# Patient Record
Sex: Female | Born: 1940
Health system: Southern US, Community
[De-identification: ages and names within clinical notes are randomized; demographics above are authoritative.]

## PROBLEM LIST (undated history)

## (undated) DIAGNOSIS — M8580 Other specified disorders of bone density and structure, unspecified site: Secondary | ICD-10-CM

## (undated) DIAGNOSIS — K2100 Gastro-esophageal reflux disease with esophagitis, without bleeding: Secondary | ICD-10-CM

## (undated) DIAGNOSIS — E785 Hyperlipidemia, unspecified: Secondary | ICD-10-CM

## (undated) DIAGNOSIS — Z8719 Personal history of other diseases of the digestive system: Secondary | ICD-10-CM

## (undated) DIAGNOSIS — M810 Age-related osteoporosis without current pathological fracture: Secondary | ICD-10-CM

## (undated) DIAGNOSIS — H049 Disorder of lacrimal system, unspecified: Secondary | ICD-10-CM

## (undated) DIAGNOSIS — N289 Disorder of kidney and ureter, unspecified: Secondary | ICD-10-CM

## (undated) DIAGNOSIS — I10 Essential (primary) hypertension: Secondary | ICD-10-CM

## (undated) DIAGNOSIS — H109 Unspecified conjunctivitis: Secondary | ICD-10-CM

## (undated) DIAGNOSIS — E039 Hypothyroidism, unspecified: Secondary | ICD-10-CM

## (undated) DIAGNOSIS — K219 Gastro-esophageal reflux disease without esophagitis: Secondary | ICD-10-CM

## (undated) DIAGNOSIS — K769 Liver disease, unspecified: Secondary | ICD-10-CM

## (undated) DIAGNOSIS — M1990 Unspecified osteoarthritis, unspecified site: Secondary | ICD-10-CM

## (undated) DIAGNOSIS — E559 Vitamin D deficiency, unspecified: Secondary | ICD-10-CM

## (undated) DIAGNOSIS — M25539 Pain in unspecified wrist: Secondary | ICD-10-CM

## (undated) DIAGNOSIS — R1319 Other dysphagia: Secondary | ICD-10-CM

## (undated) DIAGNOSIS — M949 Disorder of cartilage, unspecified: Secondary | ICD-10-CM

## (undated) DIAGNOSIS — IMO0001 Reserved for inherently not codable concepts without codable children: Secondary | ICD-10-CM

## (undated) DIAGNOSIS — M899 Disorder of bone, unspecified: Secondary | ICD-10-CM

## (undated) DIAGNOSIS — L80 Vitiligo: Secondary | ICD-10-CM

## (undated) DIAGNOSIS — N200 Calculus of kidney: Secondary | ICD-10-CM

## (undated) HISTORY — DX: Age-related osteoporosis without current pathological fracture: M81.0

## (undated) HISTORY — DX: Unspecified osteoarthritis, unspecified site: M19.90

## (undated) HISTORY — DX: Disorder of lacrimal system, unspecified: H04.9

## (undated) HISTORY — DX: Disorder of kidney and ureter, unspecified: N28.9

## (undated) HISTORY — DX: Calculus of kidney: N20.0

## (undated) HISTORY — DX: Hypothyroidism, unspecified: E03.9

## (undated) HISTORY — PX: LEG SURGERY: SHX1003

## (undated) HISTORY — DX: Essential (primary) hypertension: I10

## (undated) HISTORY — DX: Unspecified conjunctivitis: H10.9

## (undated) HISTORY — PX: COLONOSCOPY: SHX174

## (undated) HISTORY — DX: Pain in unspecified wrist: M25.539

## (undated) HISTORY — DX: Hyperlipidemia, unspecified: E78.5

## (undated) HISTORY — DX: Vitiligo: L80

## (undated) HISTORY — DX: Liver disease, unspecified: K76.9

## (undated) HISTORY — DX: Reserved for inherently not codable concepts without codable children: IMO0001

## (undated) HISTORY — DX: Vitamin D deficiency, unspecified: E55.9

## (undated) HISTORY — DX: Other specified disorders of bone density and structure, unspecified site: M85.80

## (undated) HISTORY — DX: Gastro-esophageal reflux disease without esophagitis: K21.9

## (undated) HISTORY — DX: Gastro-esophageal reflux disease with esophagitis, without bleeding: K21.00

## (undated) HISTORY — DX: Disorder of bone, unspecified: M89.9

## (undated) HISTORY — DX: Other dysphagia: R13.19

## (undated) HISTORY — DX: Gastro-esophageal reflux disease with esophagitis: K21.0

## (undated) HISTORY — DX: Disorder of cartilage, unspecified: M94.9

## (undated) HISTORY — DX: Personal history of other diseases of the digestive system: Z87.19

---

## 1971-04-05 HISTORY — PX: TUBAL LIGATION: SHX77

## 1997-11-13 ENCOUNTER — Ambulatory Visit (HOSPITAL_COMMUNITY): Admission: RE | Admit: 1997-11-13 | Discharge: 1997-11-13 | Payer: Self-pay | Admitting: Family Medicine

## 1999-06-05 ENCOUNTER — Encounter: Payer: Self-pay | Admitting: Emergency Medicine

## 1999-06-05 ENCOUNTER — Emergency Department (HOSPITAL_COMMUNITY): Admission: EM | Admit: 1999-06-05 | Discharge: 1999-06-05 | Payer: Self-pay | Admitting: Emergency Medicine

## 2000-08-29 ENCOUNTER — Other Ambulatory Visit: Admission: RE | Admit: 2000-08-29 | Discharge: 2000-08-29 | Payer: Self-pay | Admitting: Family Medicine

## 2000-09-15 ENCOUNTER — Encounter: Payer: Self-pay | Admitting: Family Medicine

## 2000-09-15 ENCOUNTER — Encounter: Admission: RE | Admit: 2000-09-15 | Discharge: 2000-09-15 | Payer: Self-pay | Admitting: Family Medicine

## 2000-10-23 ENCOUNTER — Ambulatory Visit: Admission: RE | Admit: 2000-10-23 | Discharge: 2000-10-23 | Payer: Self-pay | Admitting: *Deleted

## 2001-08-07 ENCOUNTER — Encounter: Admission: RE | Admit: 2001-08-07 | Discharge: 2001-11-05 | Payer: Self-pay | Admitting: Internal Medicine

## 2001-09-03 ENCOUNTER — Other Ambulatory Visit: Admission: RE | Admit: 2001-09-03 | Discharge: 2001-09-03 | Payer: Self-pay | Admitting: Internal Medicine

## 2004-01-05 ENCOUNTER — Ambulatory Visit: Payer: Self-pay | Admitting: Family Medicine

## 2004-01-09 ENCOUNTER — Ambulatory Visit: Payer: Self-pay | Admitting: *Deleted

## 2004-02-16 ENCOUNTER — Ambulatory Visit: Payer: Self-pay | Admitting: Family Medicine

## 2004-02-17 ENCOUNTER — Ambulatory Visit: Payer: Self-pay | Admitting: Family Medicine

## 2004-04-19 ENCOUNTER — Ambulatory Visit: Payer: Self-pay | Admitting: Family Medicine

## 2004-04-26 ENCOUNTER — Ambulatory Visit: Payer: Self-pay | Admitting: *Deleted

## 2004-06-21 ENCOUNTER — Ambulatory Visit: Payer: Self-pay | Admitting: Family Medicine

## 2004-08-23 ENCOUNTER — Ambulatory Visit: Payer: Self-pay | Admitting: Family Medicine

## 2004-08-30 ENCOUNTER — Emergency Department (HOSPITAL_COMMUNITY): Admission: EM | Admit: 2004-08-30 | Discharge: 2004-08-30 | Payer: Self-pay | Admitting: Emergency Medicine

## 2004-09-20 ENCOUNTER — Ambulatory Visit: Payer: Self-pay | Admitting: Family Medicine

## 2004-09-21 ENCOUNTER — Ambulatory Visit (HOSPITAL_COMMUNITY): Admission: RE | Admit: 2004-09-21 | Discharge: 2004-09-21 | Payer: Self-pay | Admitting: Internal Medicine

## 2004-09-28 ENCOUNTER — Ambulatory Visit: Payer: Self-pay | Admitting: Family Medicine

## 2004-10-11 ENCOUNTER — Ambulatory Visit: Payer: Self-pay | Admitting: Internal Medicine

## 2004-10-18 ENCOUNTER — Ambulatory Visit (HOSPITAL_COMMUNITY): Admission: RE | Admit: 2004-10-18 | Discharge: 2004-10-18 | Payer: Self-pay | Admitting: Internal Medicine

## 2004-10-18 ENCOUNTER — Encounter: Admission: RE | Admit: 2004-10-18 | Discharge: 2004-10-18 | Payer: Self-pay | Admitting: Family Medicine

## 2004-11-15 ENCOUNTER — Ambulatory Visit: Payer: Self-pay | Admitting: Family Medicine

## 2005-04-08 ENCOUNTER — Ambulatory Visit: Payer: Self-pay | Admitting: Family Medicine

## 2005-04-28 ENCOUNTER — Ambulatory Visit: Payer: Self-pay | Admitting: Family Medicine

## 2005-06-29 ENCOUNTER — Ambulatory Visit: Payer: Self-pay | Admitting: Family Medicine

## 2005-09-26 ENCOUNTER — Ambulatory Visit: Payer: Self-pay | Admitting: Family Medicine

## 2005-10-27 ENCOUNTER — Ambulatory Visit: Payer: Self-pay | Admitting: Family Medicine

## 2005-11-15 ENCOUNTER — Ambulatory Visit: Payer: Self-pay | Admitting: Family Medicine

## 2005-12-08 ENCOUNTER — Ambulatory Visit (HOSPITAL_COMMUNITY): Admission: RE | Admit: 2005-12-08 | Discharge: 2005-12-08 | Payer: Self-pay | Admitting: Internal Medicine

## 2005-12-22 ENCOUNTER — Encounter: Admission: RE | Admit: 2005-12-22 | Discharge: 2005-12-22 | Payer: Self-pay | Admitting: Family Medicine

## 2006-01-24 ENCOUNTER — Ambulatory Visit: Payer: Self-pay | Admitting: Family Medicine

## 2006-03-29 ENCOUNTER — Ambulatory Visit: Payer: Self-pay | Admitting: Family Medicine

## 2006-07-30 ENCOUNTER — Emergency Department (HOSPITAL_COMMUNITY): Admission: EM | Admit: 2006-07-30 | Discharge: 2006-07-30 | Payer: Self-pay | Admitting: Family Medicine

## 2006-07-31 ENCOUNTER — Ambulatory Visit: Payer: Self-pay | Admitting: Family Medicine

## 2006-08-10 ENCOUNTER — Ambulatory Visit: Payer: Self-pay | Admitting: Family Medicine

## 2006-09-07 ENCOUNTER — Ambulatory Visit: Payer: Self-pay | Admitting: Internal Medicine

## 2006-09-29 ENCOUNTER — Ambulatory Visit: Payer: Self-pay | Admitting: Internal Medicine

## 2006-10-02 ENCOUNTER — Ambulatory Visit (HOSPITAL_COMMUNITY): Admission: RE | Admit: 2006-10-02 | Discharge: 2006-10-02 | Payer: Self-pay | Admitting: Family Medicine

## 2006-10-09 ENCOUNTER — Ambulatory Visit: Payer: Self-pay | Admitting: Family Medicine

## 2006-10-13 ENCOUNTER — Ambulatory Visit (HOSPITAL_COMMUNITY): Admission: RE | Admit: 2006-10-13 | Discharge: 2006-10-13 | Payer: Self-pay | Admitting: Internal Medicine

## 2006-12-07 ENCOUNTER — Ambulatory Visit: Payer: Self-pay | Admitting: Gastroenterology

## 2006-12-11 ENCOUNTER — Ambulatory Visit (HOSPITAL_COMMUNITY): Admission: RE | Admit: 2006-12-11 | Discharge: 2006-12-11 | Payer: Self-pay | Admitting: Gastroenterology

## 2006-12-20 ENCOUNTER — Encounter (INDEPENDENT_AMBULATORY_CARE_PROVIDER_SITE_OTHER): Payer: Self-pay | Admitting: *Deleted

## 2006-12-25 ENCOUNTER — Ambulatory Visit: Payer: Self-pay | Admitting: Gastroenterology

## 2006-12-25 LAB — CONVERTED CEMR LAB
BUN: 10 mg/dL (ref 6–23)
Creatinine, Ser: 0.7 mg/dL (ref 0.4–1.2)

## 2006-12-29 ENCOUNTER — Encounter: Admission: RE | Admit: 2006-12-29 | Discharge: 2006-12-29 | Payer: Self-pay | Admitting: Gastroenterology

## 2006-12-29 DIAGNOSIS — N809 Endometriosis, unspecified: Secondary | ICD-10-CM | POA: Insufficient documentation

## 2006-12-29 DIAGNOSIS — D259 Leiomyoma of uterus, unspecified: Secondary | ICD-10-CM | POA: Insufficient documentation

## 2007-02-07 ENCOUNTER — Ambulatory Visit (HOSPITAL_COMMUNITY): Admission: RE | Admit: 2007-02-07 | Discharge: 2007-02-07 | Payer: Self-pay | Admitting: *Deleted

## 2007-06-14 ENCOUNTER — Encounter: Admission: RE | Admit: 2007-06-14 | Discharge: 2007-06-14 | Payer: Self-pay | Admitting: *Deleted

## 2007-06-28 DIAGNOSIS — E785 Hyperlipidemia, unspecified: Secondary | ICD-10-CM | POA: Insufficient documentation

## 2007-06-28 DIAGNOSIS — N200 Calculus of kidney: Secondary | ICD-10-CM | POA: Insufficient documentation

## 2007-06-28 DIAGNOSIS — M810 Age-related osteoporosis without current pathological fracture: Secondary | ICD-10-CM | POA: Insufficient documentation

## 2007-06-28 DIAGNOSIS — I10 Essential (primary) hypertension: Secondary | ICD-10-CM | POA: Insufficient documentation

## 2007-06-28 DIAGNOSIS — E039 Hypothyroidism, unspecified: Secondary | ICD-10-CM | POA: Insufficient documentation

## 2008-03-14 ENCOUNTER — Ambulatory Visit (HOSPITAL_COMMUNITY): Admission: RE | Admit: 2008-03-14 | Discharge: 2008-03-14 | Payer: Self-pay | Admitting: *Deleted

## 2008-04-08 ENCOUNTER — Encounter: Admission: RE | Admit: 2008-04-08 | Discharge: 2008-04-08 | Payer: Self-pay | Admitting: *Deleted

## 2008-12-26 ENCOUNTER — Ambulatory Visit (HOSPITAL_COMMUNITY): Admission: RE | Admit: 2008-12-26 | Discharge: 2008-12-26 | Payer: Self-pay | Admitting: Internal Medicine

## 2009-01-02 IMAGING — CT CT ENTERO PELVIS W/CM
1 of 3 series · 14 of 32 positions shown, 18 images · non-contrast
Comparison: 10/02/06.

CLINICAL DATA: Lower abdominal and pelvic pain.  Constipation.  Possible distal small bowel mass seen on recent CT.  
CT ABDOMEN ENTEROGRAPHY:
TECHNIQUE: Multidetector CT imaging of the abdomen and pelvis was performed following administration of 2029 cc of VoLumen low attenuation enteric contrast.  Images were acquired after bolus injection of nonionic intravenous contrast with coronal and sagittal reformations reviewed for assessment of the small bowel mucosa.

[Series 5: abd_pel 3.0 b40f st · axial · 0.58mm/px · z∈[-384,+21]mm · 14 of 149 slices shown, 18 images]
[im 7/149  soft-tissue]
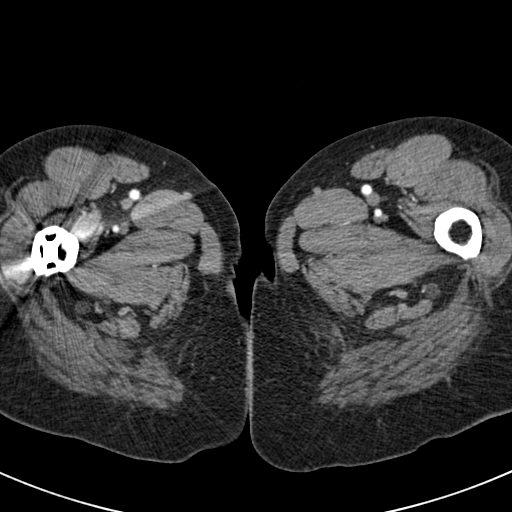
[im 7/149  bone]
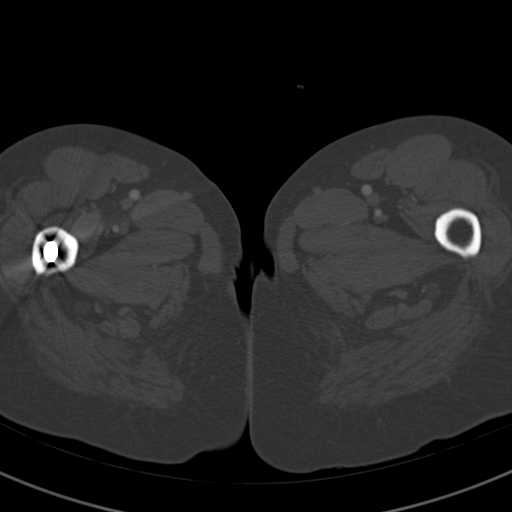
[im 21/149  soft-tissue]
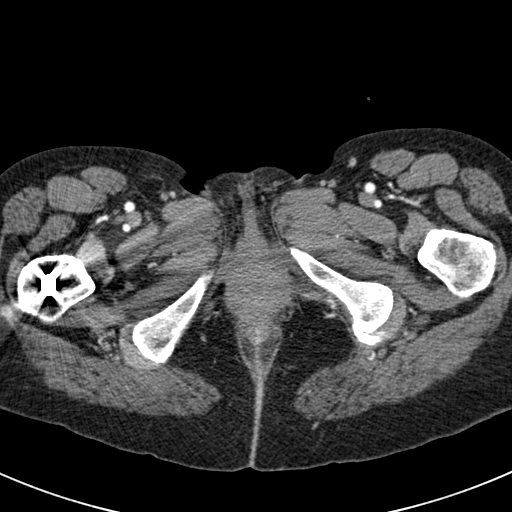
[im 34/149  soft-tissue]
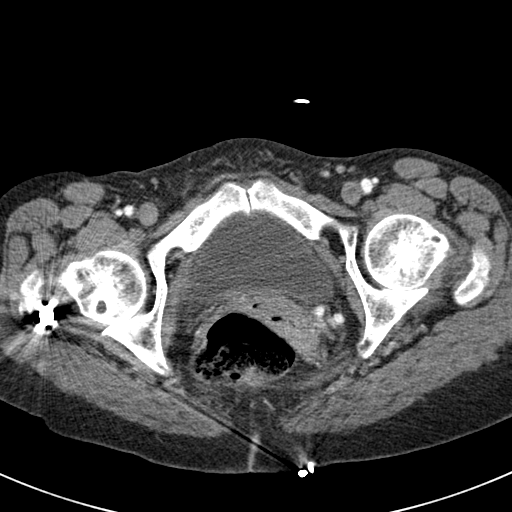
[im 48/149  soft-tissue]
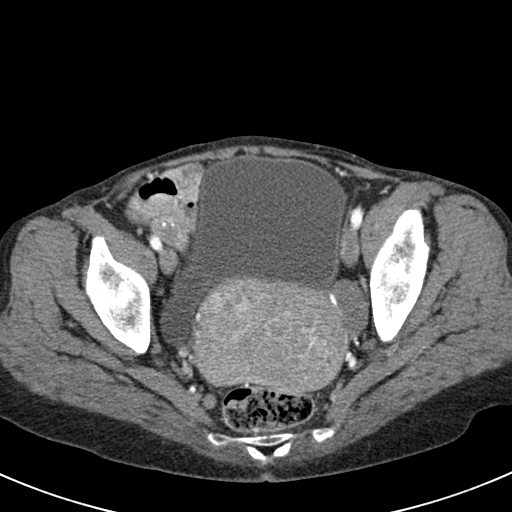
[im 54/149  soft-tissue]
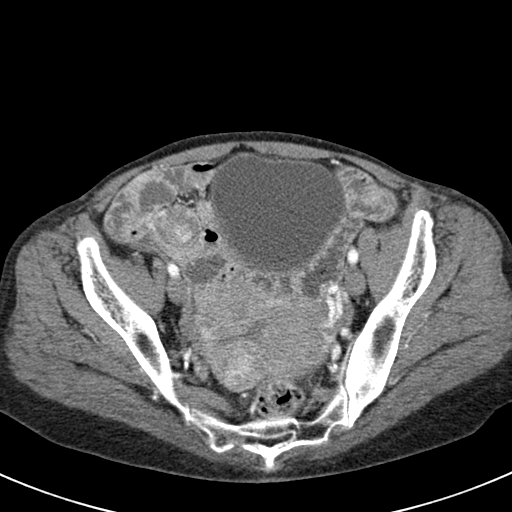
[im 68/149  soft-tissue]
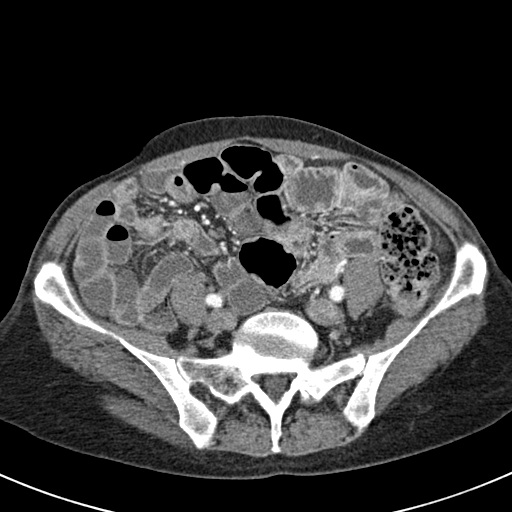
[im 81/149  soft-tissue]
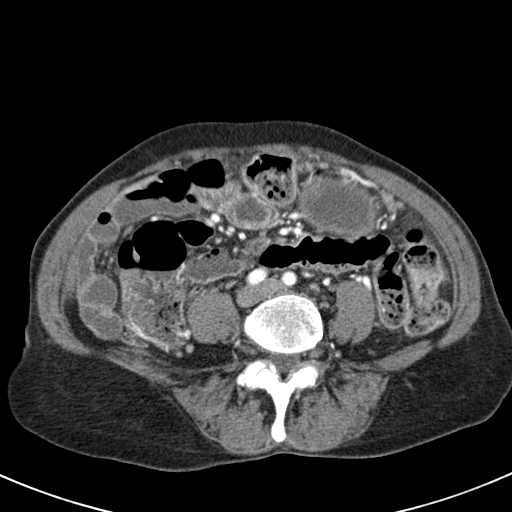
[im 95/149  soft-tissue]
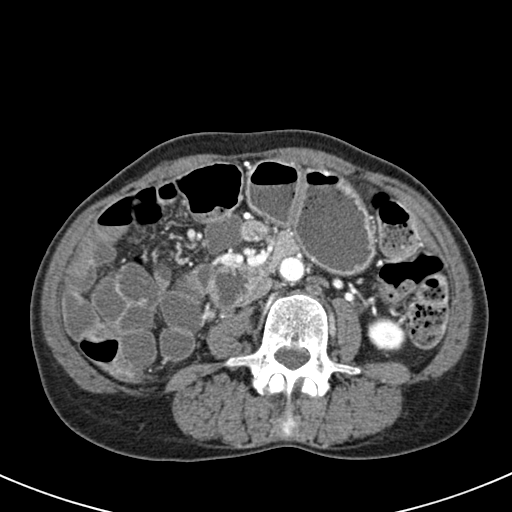
[im 101/149  soft-tissue]
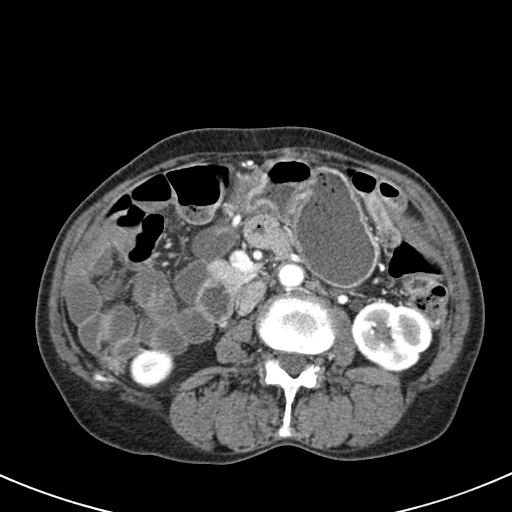
[im 101/149  bone]
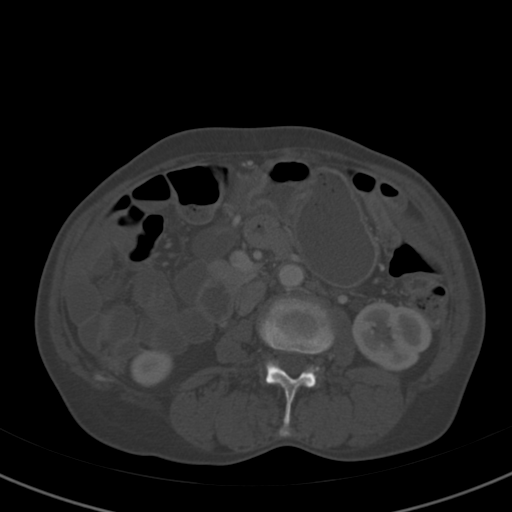
[im 115/149  soft-tissue]
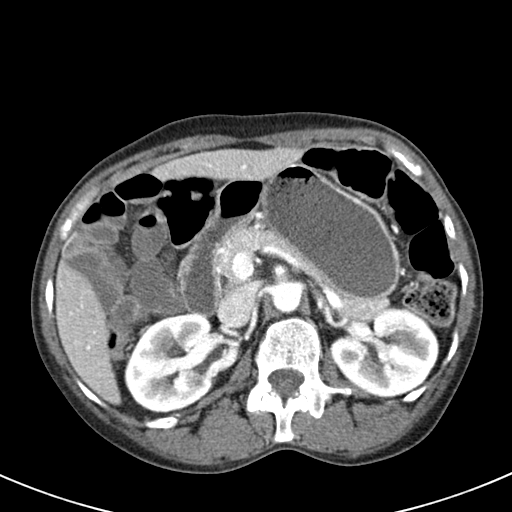
[im 122/149  lung]
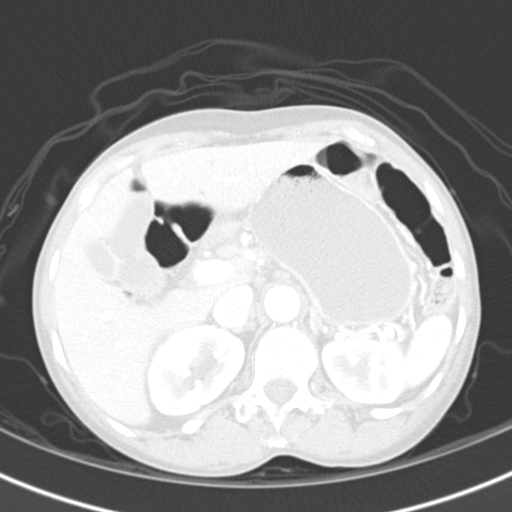
[im 128/149  soft-tissue]
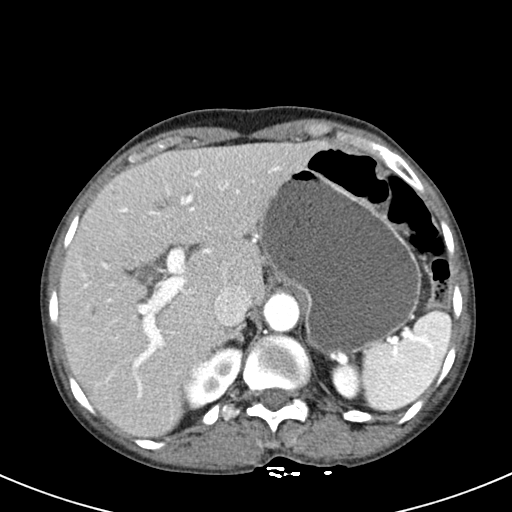
[im 128/149  lung]
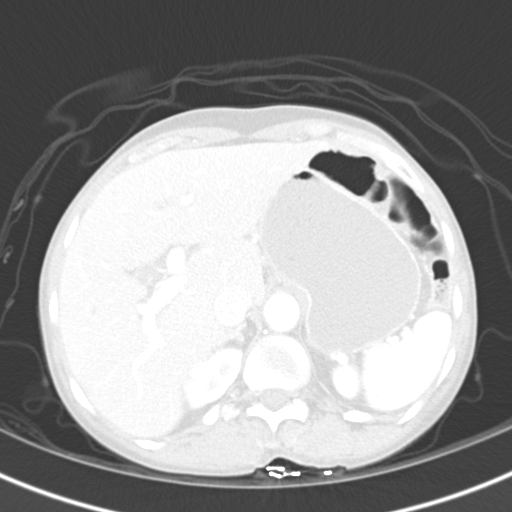
[im 135/149  lung]
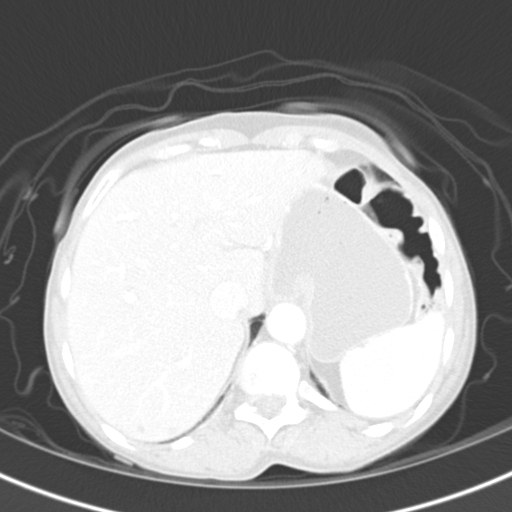
[im 142/149  soft-tissue]
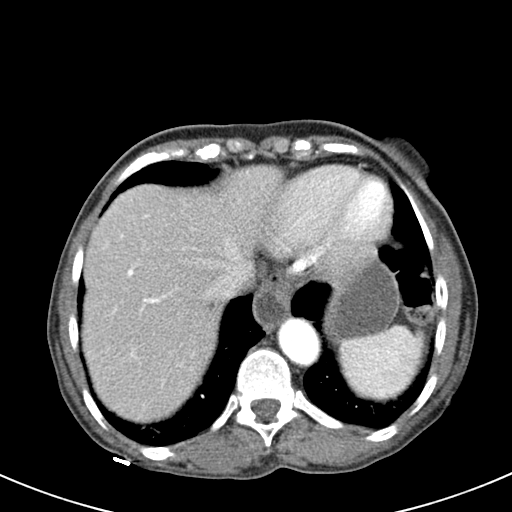
[im 142/149  lung]
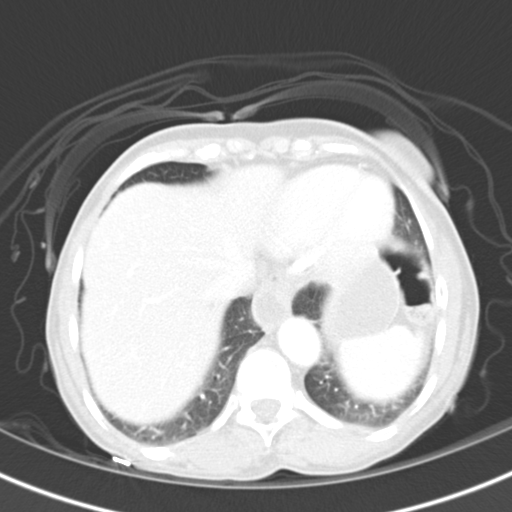

[14 of 32 positions shown; findings below may reference images not displayed]

FINDINGS: Multiple tiny hepatic cysts are again noted which are stable.  No liver masses are identified.  The spleen, pancreas, adrenal glands, and kidneys are unremarkable in appearance. A tiny less than 1 cm left renal cyst is incidentally noted.  There is no evidence of hydronephrosis. 
There is no evidence of soft tissue masses or lymphadenopathy in the abdomen.  The small bowel is well distended and no masses or areas of wall thickening are seen involving small bowel. There is no evidence of small bowel dilatation or wall thickening within the abdomen and no inflammatory process or ascites is seen.
IMPRESSION: 1.  Normal appearance of abdominal small bowel.  No mass or adenopathy identified.
2.  Stable tiny hepatic and left renal cysts.
CT PELVIS ENTEROGRAPHY:
FINDINGS: The small bowel is well distended and there is a persistent enhancing soft tissue mass seen in the right pelvis which cannot be separated from small bowel and is located along the anterior aspect of the right internal iliac vessels.  This measures 3.1 x 2.5 cm and contains two focal calcifications inferiorly.  This is stable compared with the prior study.
Multiple uterine fibroids are again noted and are without significant change.  There is no evidence of an inflammatory process or ascites.  There is no evidence of small bowel obstruction.
IMPRESSION: 1.  Multiple uterine fibroids again noted.  
2.  Persistent 3 cm soft tissue mass in the right pelvis along the anterior aspect of the internal iliac vessels, in the region of the terminal ileum and right adnexa.  Differential considerations include a GI stromal tumor, broad ligament fibroid, or right ovarian mass.  
3.  No evidence of adenopathy or ascites.

## 2009-04-09 ENCOUNTER — Ambulatory Visit (HOSPITAL_COMMUNITY): Admission: RE | Admit: 2009-04-09 | Discharge: 2009-04-09 | Payer: Self-pay | Admitting: Internal Medicine

## 2009-11-12 ENCOUNTER — Encounter (INDEPENDENT_AMBULATORY_CARE_PROVIDER_SITE_OTHER): Payer: Self-pay | Admitting: *Deleted

## 2009-12-02 ENCOUNTER — Encounter (INDEPENDENT_AMBULATORY_CARE_PROVIDER_SITE_OTHER): Payer: Self-pay | Admitting: *Deleted

## 2010-01-04 ENCOUNTER — Encounter (INDEPENDENT_AMBULATORY_CARE_PROVIDER_SITE_OTHER): Payer: Self-pay | Admitting: *Deleted

## 2010-01-04 ENCOUNTER — Telehealth (INDEPENDENT_AMBULATORY_CARE_PROVIDER_SITE_OTHER): Payer: Self-pay | Admitting: *Deleted

## 2010-01-06 ENCOUNTER — Ambulatory Visit: Payer: Self-pay | Admitting: Gastroenterology

## 2010-01-20 ENCOUNTER — Ambulatory Visit: Payer: Self-pay | Admitting: Gastroenterology

## 2010-04-25 ENCOUNTER — Encounter: Payer: Self-pay | Admitting: Internal Medicine

## 2010-05-06 NOTE — Miscellaneous (Signed)
Summary: previsit prep/rm  Clinical Lists Changes  Medications: Added new medication of MOVIPREP 100 GM  SOLR (PEG-KCL-NACL-NASULF-NA ASC-C) As per prep instructions. - Signed Rx of MOVIPREP 100 GM  SOLR (PEG-KCL-NACL-NASULF-NA ASC-C) As per prep instructions.;  #1 x 0;  Signed;  Entered by: Sherren Kerns RN;  Authorized by: Meryl Dare MD [facility];  Method [facility]: [facility] [id number]*, [address] [place], [place], [place]  [id number], Ph: [phone] or [phone], Fax: [phone] Observations: Added new observation of ALLERGY REV: Done (01/06/2010 10:49) Added new observation of NKA: T (01/06/2010 10:49)    Prescriptions: MOVIPREP 100 GM  SOLR (PEG-KCL-NACL-NASULF-NA ASC-C) As per prep instructions.  #1 x 0   Entered by:   Sherren Kerns RN   Authorized by:   Meryl Dare MD [facility]   Signed by:   Sherren Kerns RN on 01/06/2010   Method used:   [facility]ron[facility] to        [facility] [address] [phone]* (retail)       [address]       [place], [place]  [id number]       Ph: [phone] or [phone]       Fax: [phone]   RxID:   [id number]

## 2010-05-06 NOTE — Letter (Signed)
Summary: [facility] Instructions  [facility]  [address] [place], [place] [address]   Phone: [phone]  Fax: [phone]       Tina Roberts    Apr 29, 1940    MRN: [id number]        Procedure Day Dorna Bloom: Wednesday 01/20/2010     Arrival Time:  10:00 am      Procedure Time: 11:00 am     Location of Procedure:                    _x _  [facility] (4th Floor)   PREPARATION FOR COLONOSCOPY WITH MOVIPREP   Starting 5 days prior to your procedure Friday 10/14 do not eat nuts, seeds, popcorn, corn, beans, peas,  salads, or any raw vegetables.  Do not take any fiber supplements (e.g. Metamucil, Citrucel, and Benefiber).  THE DAY BEFORE YOUR PROCEDURE         DATE: Tuesday 10/18  1.  Drink clear liquids the entire day-NO SOLID FOOD  2.  Do not drink anything colored red or purple.  Avoid juices with pulp.  No orange juice.  3.  Drink at least 64 oz. (8 glasses) of fluid/clear liquids during the day to prevent dehydration and help the prep work efficiently.  CLEAR LIQUIDS INCLUDE: Water Jello Ice Popsicles Tea (sugar ok, no milk/cream) Powdered fruit flavored drinks Coffee (sugar ok, no milk/cream) Gatorade Juice: apple, white grape, white cranberry  Lemonade Clear bullion, consomm, broth Carbonated beverages (any kind) Strained chicken noodle soup Hard Candy                             4.  In the morning, mix first dose of MoviPrep solution:    Empty 1 Pouch A and 1 Pouch B into the disposable container    Add lukewarm drinking water to the top line of the container. Mix to dissolve    Refrigerate (mixed solution should be used within 24 hrs)  YOU WILL NEED TO BUY 2 BOTTLES OF MAGNESIUM CITRATE AND DRINK  AROUND 2:00 or 3:00 IN THE AFTERNOON 5.  Begin drinking the prep at 5:00 p.m. The MoviPrep container is divided by 4 marks.   Every 15 minutes drink the solution down to the next mark (approximately 8 oz) until the full liter is  complete.   6.  Follow completed prep with 16 oz of clear liquid of your choice (Nothing red or purple).  Continue to drink clear liquids until bedtime.  7.  Before going to bed, mix second dose of MoviPrep solution:    Empty 1 Pouch A and 1 Pouch B into the disposable container    Add lukewarm drinking water to the top line of the container. Mix to dissolve    Refrigerate  THE DAY OF YOUR PROCEDURE      DATE:  01-20-10  DAY:  Wednesday  Beginning at  6:00 a.m. (5 hours before procedure):         1. Every 15 minutes, drink the solution down to the next mark (approx 8 oz) until the full liter is complete.  2. Follow completed prep with 16 oz. of clear liquid of your choice.    3. You may drink clear liquids until  9:00 a.m. (2 HOURS BEFORE PROCEDURE).   MEDICATION INSTRUCTIONS  Unless otherwise instructed, you should take regular prescription medications with a small sip of water   as early  as possible the morning of your procedure.   Additional medication instructions: n/a        OTHER INSTRUCTIONS  You will need a responsible adult at least 69 years of age to accompany you and drive you home.   This person must remain in the waiting room during your procedure.  Wear loose fitting clothing that is easily removed.  Leave jewelry and other valuables at home.  However, you may wish to bring a book to read or  an iPod/MP3 player to listen to music as you wait for your procedure to start.  Remove all body piercing jewelry and leave at home.  Total time from sign-in until discharge is approximately 2-3 hours.  You should go home directly after your procedure and rest.  You can resume normal activities the  day after your procedure.  The day of your procedure you should not:   Drive   Make legal decisions   Operate machinery   Drink alcohol   Return to work  You will receive specific instructions about eating, activities and medications before you  leave.    The above instructions have been reviewed and explained to me by   Sherren Kerns RN  January 06, 2010 11:16 AM     I fully understand and can verbalize these instructions _____________________________ Date _________

## 2010-05-06 NOTE — Progress Notes (Signed)
Summary: need for more extensive prep  Please have the patient take two bottles of Mag Citrate in the afternoon before begininng MoviPrep. MS  Dr. Russella Dar - Stefan Church had colonoscopy 12/25/2006.  You noted that pt would need more extensive prep for future colonoscopy.  She is scheduled for colonoscopy 01/20/2010.  What would you like for her to do for prep?  Thanks, Ezra Sites     Follow-up for Phone Call       Follow-up by: Meryl Dare MD Clementeen Graham,  January 04, 2010 3:18 PM

## 2010-05-06 NOTE — Letter (Signed)
Summary: Colonoscopy Letter  [facility]  [address] [place], [place] [address]   Phone: [phone]  Fax: [phone]      November 12, 2009 MRN: [id number]   Tina Roberts [address] [place], [place]  [address]   Dear Tina Roberts,   According to your medical record, it is time for you to schedule a Colonoscopy. The American Cancer Society recommends this procedure as a method to detect early colon cancer. Patients with a family history of colon cancer, or a personal history of colon polyps or inflammatory bowel disease are at increased risk.  This letter has beeen generated based on the recommendations made at the time of your procedure. If you feel that in your particular situation this may no longer apply, please contact our office.  Please call our office at [phone] to schedule this appointment or to update your records at your earliest convenience.  Thank you for cooperating with [place] to provide you with the very best care possible.   Sincerely,  Judie Petit T. Russella Dar, M.D.  [facility] Gastroenterology Division [phone]

## 2010-05-06 NOTE — Letter (Signed)
Summary: Pre Visit Letter Revised  [facility]  [address] [place], [place] [address]   Phone: [phone]  Fax: [phone]        12/02/2009 MRN: [id number]   Tina Roberts [address] [place], [place]  [address]              Welcome to the Gastroenterology Division at [facility].    You are scheduled to see a nurse for your pre-procedure visit on January 06, 2010 at  11:00am on the 3rd floor at [facility], [address]  We ask that you try to arrive at our office 15 minutes prior to your appointment time to allow for check-in.  Please take a minute to review the attached form.  If you answer "Yes" to one or more of the questions on the first page, we ask that you call the person listed at your earliest opportunity.  If you answer "No" to all of the questions, please complete the rest of the form and bring it to your appointment.    Your nurse visit will consist of discussing your medical and surgical history, your immediate family medical history, and your medications.   If you are unable to list all of your medications on the form, please bring the medication bottles to your appointment and we will list them.  We will need to be aware of both prescribed and over the counter drugs.  We will need to know exact dosage information as well.    Please be prepared to read and sign documents such as consent forms, a financial agreement, and acknowledgement forms.  If necessary, and with your consent, a friend or relative is welcome to sit-in on the nurse visit with you.  Please bring your insurance card so that we may make a copy of it.  If your insurance requires a referral to see a specialist, please bring your referral form from your primary care physician.  No co-pay is required for this nurse visit.     If you cannot keep your appointment, please call [phone] to cancel or reschedule prior to your appointment date.  This allows [place] the  opportunity to schedule an appointment for another patient in need of care.    Thank you for choosing [facility] for your medical needs.  We appreciate the opportunity to care for you.  Please visit [place] at our website  to learn more about our practice.  Sincerely, The Gastroenterology Division

## 2010-05-06 NOTE — Procedures (Signed)
Summary: Colonoscopy  Patient: Tina Roberts Note: All result statuses are Final unless otherwise noted.  Tests: (1) Colonoscopy (COL)   COL Colonoscopy           DONE     [facility]     [address]     [place], [place]  [address]           COLONOSCOPY PROCEDURE REPORT     PATIENT:  Tina, Roberts  MR#:  [id number]     BIRTHDATE:  [birth year], 69 yrs. old  GENDER:  female     ENDOSCOPIST:  Judie Petit T. Russella Dar, MD, [facility]           PROCEDURE DATE:  01/20/2010     PROCEDURE:  Colonoscopy [address]     ASA CLASS:  Class II     INDICATIONS:  1) Elevated Risk Screening  2) family history of     colon cancer: mother at age 66.     MEDICATIONS:   Fentanyl 50 mcg IV, Versed 5 mg IV     DESCRIPTION OF PROCEDURE:  After the risks benefits and     alternatives of the procedure were thoroughly explained, informed     consent was obtained.  Digital rectal exam was performed and     revealed no abnormalities.  The LB [id number]-[id number] endoscope     was introduced through the anus and advanced to the cecum, which     was identified by both the appendix and ileocecal valve, without     limitations. The quality of the prep was excellent, using MoviPrep     and 2 bottles of mag citrate.  The instrument was then slowly     withdrawn as the colon was fully examined.     <<PROCEDUREIMAGES>>     FINDINGS:  A normal appearing cecum, ileocecal valve, and     appendiceal orifice were identified. The ascending, hepatic     flexure, transverse, splenic flexure, descending, sigmoid colon,     and rectum appeared unremarkable. Retroflexed views in the rectum     revealed no abnormalities. The time to cecum =  4  minutes. The     scope was then withdrawn (time =  8  min) from the patient and the     procedure completed.     COMPLICATIONS:  None           ENDOSCOPIC IMPRESSION:     1) Normal colon           RECOMMENDATIONS:     1) Given your  family history of colon cancer, you should have a  repeat colonoscopy in 5 years           Malcolm T. Russella Dar, MD, Clementeen Graham           CC: Jacalyn Lefevre, MD           n.     Rosalie DoctorVenita Lick. Stark at 01/20/2010 11:56 AM           Stefan Church, [phone]  Note: An exclamation mark (!) indicates a result that was not dispersed into the flowsheet. Document Creation Date: 01/20/2010 11:57 AM _______________________________________________________________________  (1) Order result status: Final Collection or observation date-time: 01/20/2010 11:53 Requested date-time:  Receipt date-time:  Reported date-time:  Referring Physician:   Ordering Physician: Claudette Head [phone]) Specimen Source:  Source: Launa Grill Order Number: [id number] Lab site:   Appended Document: Colonoscopy    Clinical Lists Changes  Observations:  Added new observation of COLONNXTDUE: 01/2015 (01/20/2010 13:16)

## 2010-05-06 NOTE — Procedures (Signed)
Summary: Gastroenterology COLON  Gastroenterology COLON   Imported By: Lowry Ram CMA 07/02/2007 16:16:49  _____________________________________________________________________  External Attachment:    Type:   Image     Comment:   External Document

## 2010-06-18 ENCOUNTER — Other Ambulatory Visit: Payer: Self-pay | Admitting: Internal Medicine

## 2010-06-18 DIAGNOSIS — Z1231 Encounter for screening mammogram for malignant neoplasm of breast: Secondary | ICD-10-CM

## 2010-06-25 ENCOUNTER — Ambulatory Visit (HOSPITAL_COMMUNITY)
Admission: RE | Admit: 2010-06-25 | Discharge: 2010-06-25 | Disposition: A | Discharge status: Home / Self Care | Payer: MEDICARE | Source: Ambulatory Visit | Attending: Internal Medicine | Admitting: Internal Medicine

## 2010-06-25 ENCOUNTER — Ambulatory Visit: Payer: Self-pay

## 2010-06-25 DIAGNOSIS — Z1231 Encounter for screening mammogram for malignant neoplasm of breast: Secondary | ICD-10-CM | POA: Insufficient documentation

## 2010-08-17 NOTE — Assessment & Plan Note (Signed)
[facility]                         GASTROENTEROLOGY OFFICE NOTE   Tina Roberts, Tina Roberts                       MRN:          [id number]  DATE:12/07/2006                            DOB:          January 23, 1941    REFERRING PHYSICIAN:  Bertram Millard. Hyacinth Meeker, M.D.   REASON FOR CONSULTATION:  Right lower quadrant abdominal pain and an  abnormal CT scan.   HISTORY OF PRESENT ILLNESS:  Tina Roberts is a 70 year old African American  female previously followed at [facility]  Her new primary physician is  Dr. Jacalyn Lefevre.  She relates the onset of right lower quadrant pain  that generalized to her entire abdomen approximately 3 months ago.  Her  symptoms slowly improved and her abdominal pain has now completely  subsided.  She suspects she had a muscle strain as her symptoms began  after lifting.  A CBC, comprehensive metabolic panel, lipid panel, and  TSH were performed in late June 2008, which were only remarkable for an  elevated total protein at 8.6.  A CT scan of the abdomen and pelvis was  performed on October 02, 2006, which showed a small to moderate hiatal  hernia, bibasilar atelectasis versus scarring, numerous scattered  hepatic hypodensities 1-cm or less, hepatic cysts were suspected, and a  central hepatic hypodensity measuring approximately 2 cm which was  nonspecific in appearance.  In addition, there was a focal right lower  quadrant abdominal enhancement within a loop of ileum suspicious for  small bowel lesion such as a GIST tumor.  Uterine fibroids were also  noted.  She subsequently underwent an MRI of the abdomen and pelvis with  and without contrast on October 13, 2006, which showed multiple hepatic  cysts as well as a mildly complex right hepatic lobe cyst versus a  biliary cystadenoma.  No aggressive features were demonstrated and an  interval followup at 6 months was recommended.  The right pelvic lesion  noted on the CT scan was not optimally  imaged on the MRI.  The patient  notes no weight loss, abdominal pain, pelvic pain, change in bowel  habits, diarrhea, melena, or hematochezia.  She has had long term  problems with constipation and has used laxatives for many years and  these symptoms have not changed.  Her mother was diagnosed with colon  cancer at age 39.   PAST MEDICAL HISTORY:  1. Hypertension.  2. Hyperlipidemia.  3. Kidney stones.  4. Hypothyroidism.  5. Osteoporosis.   PAST SURGICAL HISTORY:  Negative.   CURRENT MEDICATIONS:  Listed on the chart, updated and reviewed.   MEDICATION ALLERGIES:  None known.   SOCIAL HISTORY:  Per the hand written form.   REVIEW OF SYSTEMS:  Per the hand written form.   PHYSICAL EXAMINATION:  GENERAL:  Well developed, thin, African American  female in no acute distress.  VITAL SIGNS:  Height 5 feet 2 inches, weight 121 pounds, blood pressure  is 144/70, pulse 68 and regular.  HEENT:  Anicteric sclerae.  Oropharynx clear.  CHEST:  Clear to auscultation bilaterally.  CARDIAC:  Regular rate and rhythm without murmurs  appreciated.  ABDOMEN:  Soft, nontender, nondistended.  Normoactive bowel sounds.  No  palpable organomegaly, masses, or hernias.  RECTAL:  Deferred to colonoscopy.  EXTREMITIES:  Without clubbing, cyanosis, or edema.  NEUROLOGIC:  Alert and oriented x3.  Grossly nonfocal.   ASSESSMENT/PLAN:  1. Right lower quadrant pain, resolved, etiology unclear.  I do not      feel this is related to the small bowel or hepatic abnormalities      noted on CT scan.  I suspect she may have had a muscle strain.  2. Mother with colon cancer at age 64.  Risks, benefits, and      alternatives to colonoscopy with possible biopsy and possible      polypectomy discussed with the patient.  She consents to receive      this.  We scheduled electively.  3. Hepatic lesion with features consistent with a benign lesion.  A      complex cyst or a biliary cystadenoma was suspected       radiographically.  Plan for a followup CT scan in 6 months.  4. Ileal lesion.  CT features suggestive of a GIST, proceed with a CT      enterography for further evaluation, may also consider a capsule      endoscopy for further evaluation.  Further plans pending findings      of CT enterography.     Venita Lick. Russella Dar, MD, [facility]  Electronically Signed    MTS/MedQ  DD: 12/08/2006  DT: 12/08/2006  Job #: [id number]   cc:   Bertram Millard. Hyacinth Meeker, M.D.

## 2011-07-05 ENCOUNTER — Other Ambulatory Visit (HOSPITAL_COMMUNITY): Payer: Self-pay | Admitting: Internal Medicine

## 2011-07-05 DIAGNOSIS — Z1231 Encounter for screening mammogram for malignant neoplasm of breast: Secondary | ICD-10-CM

## 2011-07-29 ENCOUNTER — Ambulatory Visit (HOSPITAL_COMMUNITY)
Admission: RE | Admit: 2011-07-29 | Discharge: 2011-07-29 | Disposition: A | Discharge status: Home / Self Care | Payer: Medicare Other | Source: Ambulatory Visit | Attending: Internal Medicine | Admitting: Internal Medicine

## 2011-07-29 DIAGNOSIS — Z1231 Encounter for screening mammogram for malignant neoplasm of breast: Secondary | ICD-10-CM | POA: Insufficient documentation

## 2012-01-25 ENCOUNTER — Other Ambulatory Visit (HOSPITAL_COMMUNITY): Payer: Self-pay | Admitting: Internal Medicine

## 2012-01-25 DIAGNOSIS — Z780 Asymptomatic menopausal state: Secondary | ICD-10-CM

## 2012-01-27 ENCOUNTER — Ambulatory Visit (HOSPITAL_COMMUNITY)
Admission: RE | Admit: 2012-01-27 | Discharge: 2012-01-27 | Disposition: A | Discharge status: Home / Self Care | Payer: Medicare Other | Source: Ambulatory Visit | Attending: Internal Medicine | Admitting: Internal Medicine

## 2012-01-27 DIAGNOSIS — Z13820 Encounter for screening for osteoporosis: Secondary | ICD-10-CM | POA: Insufficient documentation

## 2012-01-27 DIAGNOSIS — Z780 Asymptomatic menopausal state: Secondary | ICD-10-CM | POA: Insufficient documentation

## 2012-02-15 ENCOUNTER — Encounter: Payer: Self-pay | Admitting: Gastroenterology

## 2012-02-15 ENCOUNTER — Ambulatory Visit (INDEPENDENT_AMBULATORY_CARE_PROVIDER_SITE_OTHER): Payer: Medicare Other | Admitting: Gastroenterology

## 2012-02-15 VITALS — BP 146/80 | HR 80 | Ht 62.0 in | Wt 124.0 lb

## 2012-02-15 DIAGNOSIS — R1319 Other dysphagia: Secondary | ICD-10-CM

## 2012-02-15 DIAGNOSIS — Z800 Family history of malignant neoplasm of digestive organs: Secondary | ICD-10-CM

## 2012-02-15 DIAGNOSIS — K219 Gastro-esophageal reflux disease without esophagitis: Secondary | ICD-10-CM

## 2012-02-15 NOTE — Patient Instructions (Addendum)
Please increase your omeprazole 10 mg two tablets (20mg ) by mouth once daily until prescription is finished. Then purchase over the counter Prilosec 20 mg to take one tablet by mouth once daily.   Patient advised to avoid spicy, acidic, citrus, chocolate, mints, fruit and fruit juices.  Limit the intake of caffeine, alcohol and Soda.  Don't exercise too soon after eating.  Don't lie down within 3-4 hours of eating.  Elevate the head of your bed.   You have been scheduled for a modified barium swallow on 02/22/12 at 11:00am. Please arrive 15 minutes prior to your test for registration. You will go to [facility] (1st Floor) for your appointment. Please refrain from eating or drinking anything 4 hours prior to your test. Should you need to cancel or reschedule your appointment, please contact [phone] [facility]) or [phone] [facility]). Your Barium Swallow has been scheduled immediately after your Modified Barium Swallow also at [facility] at 11:30am in the Radiology department.  _____________________________________________________________________ A Modified Barium Swallow Study, or MBS, is a special x-ray that is taken to check swallowing skills. It is carried out by a Marine scientist and a Warehouse manager (SLP). During this test, yourmouth, throat, and esophagus, a muscular tube which connects your mouth to your stomach, is checked. The test will help you, your doctor, and the SLP plan what types of foods and liquids are easier for you to swallow. The SLP will also identify positions and ways to help you swallow more easily and safely. What will happen during an MBS? You will be taken to an x-ray room and seated comfortably. You will be asked to swallow small amounts of food and liquid mixed with barium. Barium is a liquid or paste that allows images of your mouth, throat and esophagus to be seen on x-ray. The x-ray captures moving images of the food you are  swallowing as it travels from your mouth through your throat and into your esophagus. This test helps identify whether food or liquid is entering your lungs (aspiration). The test also shows which part of your mouth or throat lacks strength or coordination to move the food or liquid in the right direction. This test typically takes 30 minutes to 1 hour to complete. _______________________________________________________________________    cc: Oneal Grout, MD

## 2012-02-15 NOTE — Progress Notes (Signed)
History of Present Illness: This is a 71 year old referred for evaluation of difficulty swallowing. She relates for the past month she has a choking sensation and needs to clear her throat after swallowing liquids. She has no difficulty swallowing solid foods. She has occasional heartburn and was recently started on omeprazole 10 mg daily with no improvement in her symptoms.  She takes Fosamax. Denies weight loss, abdominal pain, constipation, diarrhea, change in stool caliber, melena, hematochezia, nausea, vomiting, chest pain.  Current Medications, Allergies, Past Medical History, Past Surgical History, Family History and Social History were reviewed in [employer] record.  Physical Exam: General: Well developed , well nourished, no acute distress Head: Normocephalic and atraumatic Eyes:  sclerae anicteric, EOMI Ears: Normal auditory acuity Mouth: No deformity or lesions Lungs: Clear throughout to auscultation Heart: Regular rate and rhythm; no murmurs, rubs or bruits Abdomen: Soft, non tender and non distended. No masses, hepatosplenomegaly or hernias noted. Normal Bowel sounds Musculoskeletal: Symmetrical with no gross deformities  Pulses:  Normal pulses noted Extremities: No clubbing, cyanosis, edema or deformities noted Neurological: Alert oriented x 4, grossly nonfocal Psychological:  Alert and cooperative. Normal mood and affect  Assessment and Recommendations:  1. Dysphagia, symptoms most likely oropharyngeal. Schedule a modified barium swallow study and barium esophagram to further evaluate.  2. GERD. Standard antireflux measures and increase omeprazole to 20 mg daily.  3. Family history of colon cancer. Last colonoscopy 02/2010, screening exam in 02/2015 is recommended.

## 2012-02-22 ENCOUNTER — Ambulatory Visit (HOSPITAL_COMMUNITY)
Admission: RE | Admit: 2012-02-22 | Discharge: 2012-02-22 | Disposition: A | Discharge status: Home / Self Care | Payer: Medicare HMO | Source: Ambulatory Visit | Attending: Gastroenterology | Admitting: Gastroenterology

## 2012-02-22 DIAGNOSIS — R1319 Other dysphagia: Secondary | ICD-10-CM

## 2012-02-22 DIAGNOSIS — E039 Hypothyroidism, unspecified: Secondary | ICD-10-CM | POA: Insufficient documentation

## 2012-02-22 DIAGNOSIS — K219 Gastro-esophageal reflux disease without esophagitis: Secondary | ICD-10-CM

## 2012-02-22 DIAGNOSIS — M810 Age-related osteoporosis without current pathological fracture: Secondary | ICD-10-CM | POA: Insufficient documentation

## 2012-02-22 DIAGNOSIS — E785 Hyperlipidemia, unspecified: Secondary | ICD-10-CM | POA: Insufficient documentation

## 2012-02-22 DIAGNOSIS — R1310 Dysphagia, unspecified: Secondary | ICD-10-CM

## 2012-02-22 DIAGNOSIS — E559 Vitamin D deficiency, unspecified: Secondary | ICD-10-CM | POA: Insufficient documentation

## 2012-02-22 DIAGNOSIS — I10 Essential (primary) hypertension: Secondary | ICD-10-CM | POA: Insufficient documentation

## 2012-02-22 NOTE — Procedures (Signed)
Objective Swallowing Evaluation: Modified Barium Swallowing Study  Patient Details  Name: Tina Roberts MRN: [id number] Date of Birth: 03-26-1941  Today's Date: 02/22/2012 Time: 1130-1155 SLP Time Calculation (min): 25 min  Past Medical History:  Past Medical History  Diagnosis Date  . Hypertension   . Hyperlipidemia   . Osteoporosis   . Kidney stone   . Hypothyroidism   . Hepatic lesion   . History of gallstones   . Vitiligo   . GERD (gastroesophageal reflux disease)   . Vitamin D deficiency    Past Surgical History:  Past Surgical History  Procedure Date  . Tubal ligation 1973  . Leg surgery     right   HPI:  71 year old female seen for OP MBS due to recent c/o globus, throat clearing, and need to spit saliva. Patient reports globus with liquids only. Currently on omeprazole 10mg  daily for GERD.      Assessment / Plan / Recommendation Clinical Impression  Dysphagia Diagnosis: Suspected primary esophageal dysphagia Clinical impression: Patient presents with a suspected primary esophageal dysphagia given c/o globus and chronic throat clearing despite normal oropharyngeal swallowing function. Full airway protection noted. patient frequently stating she "needs to spit" post swallow with liquids only. SLP observed mild lingual residuals post swallow and when cued patient to clear with dry swallow, patient able without difficulty. Unclear why patient feels the need to expectorate residuals with liquids only. Encouraged patient to utilize dry swallow to clear which is effective. No further SLP needs indicated at this time. Defer further GI w/u/management to MD.      Treatment Recommendation  No treatment recommended at this time    Diet Recommendation Regular;Thin liquid   Liquid Administration via: Cup;Straw Medication Administration: Whole meds with liquid Supervision: Patient able to self feed Compensations: Slow rate;Small sips/bites Postural Changes and/or Swallow  Maneuvers: Seated upright 90 degrees;Upright 30-60 min after meal    Other  Recommendations Oral Care Recommendations: Oral care BID   Follow Up Recommendations  None               General HPI: 71 year old female seen for OP MBS due to recent c/o globus, throat clearing, and need to spit saliva. Patient reports globus with liquids only. Currently on omeprazole 10mg  daily for GERD.  Type of Study: Modified Barium Swallowing Study Reason for Referral: Objectively evaluate swallowing function Previous Swallow Assessment: none. barium swallow scheduled for after today's exam. results pending.  Diet Prior to this Study: Regular;Thin liquids Temperature Spikes Noted: No Respiratory Status: Room air History of Recent Intubation: No Behavior/Cognition: Alert;Cooperative;Pleasant mood Oral Cavity - Dentition: Poor condition (waiting for dentures) Oral Motor / Sensory Function: Within functional limits Self-Feeding Abilities: Able to feed self Patient Positioning: Upright in chair Baseline Vocal Quality: Clear Volitional Cough: Strong Volitional Swallow: Able to elicit Anatomy: Within functional limits Pharyngeal Secretions: Not observed secondary MBS    Reason for Referral Objectively evaluate swallowing function   Oral Phase Oral Preparation/Oral Phase Oral Phase: Impaired Oral - Thin Oral - Thin Cup: Lingual/palatal residue Oral - Thin Straw: Lingual/palatal residue Oral Phase - Comment Oral Phase - Comment: patient frequently stating she "needs to spit" post swallow with liquids only. SLP observed mild lingual residuals post swallow and when cued patient to clear with dry swallow, patient able without difficulty. Unclear why patient feels the need to expectorate residuals with liquids only.    Pharyngeal Phase Pharyngeal Phase Pharyngeal Phase: Within functional limits  Cervical Esophageal Phase  GO    Cervical Esophageal Phase Cervical Esophageal Phase:  [facility]    Functional Assessment Tool Used: skilled clinical judgement Functional Limitations: Swallowing Swallow Current Status (W0981): At least 1 percent but less than 20 percent impaired, limited or restricted Swallow Goal Status [phone]): At least 1 percent but less than 20 percent impaired, limited or restricted Swallow Discharge Status [phone]): At least 1 percent but less than 20 percent impaired, limited or restricted   [facility] MA, CCC-SLP [phone]  Ferdinand Lango Meryl 02/22/2012, 2:32 PM

## 2012-02-23 ENCOUNTER — Ambulatory Visit (HOSPITAL_COMMUNITY): Payer: Medicare Other

## 2012-05-19 ENCOUNTER — Encounter (HOSPITAL_COMMUNITY): Payer: Self-pay | Admitting: *Deleted

## 2012-05-19 ENCOUNTER — Telehealth (HOSPITAL_COMMUNITY): Payer: Self-pay | Admitting: Emergency Medicine

## 2012-05-19 ENCOUNTER — Emergency Department (HOSPITAL_COMMUNITY)
Admission: EM | Admit: 2012-05-19 | Discharge: 2012-05-19 | Disposition: A | Discharge status: Home / Self Care | Payer: Medicare HMO | Attending: Emergency Medicine | Admitting: Emergency Medicine

## 2012-05-19 ENCOUNTER — Emergency Department (HOSPITAL_COMMUNITY): Payer: Medicare HMO

## 2012-05-19 DIAGNOSIS — Z79899 Other long term (current) drug therapy: Secondary | ICD-10-CM | POA: Insufficient documentation

## 2012-05-19 DIAGNOSIS — Z8669 Personal history of other diseases of the nervous system and sense organs: Secondary | ICD-10-CM | POA: Insufficient documentation

## 2012-05-19 DIAGNOSIS — S43499A Other sprain of unspecified shoulder joint, initial encounter: Secondary | ICD-10-CM | POA: Insufficient documentation

## 2012-05-19 DIAGNOSIS — I10 Essential (primary) hypertension: Secondary | ICD-10-CM | POA: Insufficient documentation

## 2012-05-19 DIAGNOSIS — M810 Age-related osteoporosis without current pathological fracture: Secondary | ICD-10-CM | POA: Insufficient documentation

## 2012-05-19 DIAGNOSIS — Z87448 Personal history of other diseases of urinary system: Secondary | ICD-10-CM | POA: Insufficient documentation

## 2012-05-19 DIAGNOSIS — E039 Hypothyroidism, unspecified: Secondary | ICD-10-CM | POA: Insufficient documentation

## 2012-05-19 DIAGNOSIS — Z8719 Personal history of other diseases of the digestive system: Secondary | ICD-10-CM | POA: Insufficient documentation

## 2012-05-19 DIAGNOSIS — Y939 Activity, unspecified: Secondary | ICD-10-CM | POA: Insufficient documentation

## 2012-05-19 DIAGNOSIS — E785 Hyperlipidemia, unspecified: Secondary | ICD-10-CM | POA: Insufficient documentation

## 2012-05-19 DIAGNOSIS — IMO0002 Reserved for concepts with insufficient information to code with codable children: Secondary | ICD-10-CM | POA: Insufficient documentation

## 2012-05-19 DIAGNOSIS — Z8611 Personal history of tuberculosis: Secondary | ICD-10-CM | POA: Insufficient documentation

## 2012-05-19 DIAGNOSIS — K219 Gastro-esophageal reflux disease without esophagitis: Secondary | ICD-10-CM | POA: Insufficient documentation

## 2012-05-19 DIAGNOSIS — M7522 Bicipital tendinitis, left shoulder: Secondary | ICD-10-CM

## 2012-05-19 DIAGNOSIS — X58XXXA Exposure to other specified factors, initial encounter: Secondary | ICD-10-CM | POA: Insufficient documentation

## 2012-05-19 DIAGNOSIS — E559 Vitamin D deficiency, unspecified: Secondary | ICD-10-CM | POA: Insufficient documentation

## 2012-05-19 DIAGNOSIS — Y929 Unspecified place or not applicable: Secondary | ICD-10-CM | POA: Insufficient documentation

## 2012-05-19 DIAGNOSIS — Z7983 Long term (current) use of bisphosphonates: Secondary | ICD-10-CM | POA: Insufficient documentation

## 2012-05-19 DIAGNOSIS — S46819A Strain of other muscles, fascia and tendons at shoulder and upper arm level, unspecified arm, initial encounter: Secondary | ICD-10-CM | POA: Insufficient documentation

## 2012-05-19 MED ORDER — HYDROCODONE-ACETAMINOPHEN 5-325 MG PO TABS: ORAL_TABLET | ORAL | Status: DC

## 2012-05-19 NOTE — ED Notes (Signed)
Pt reports left arm pain for several weeks, unsure of injury to arm, pain increases with movement.

## 2012-05-21 NOTE — ED Provider Notes (Signed)
History     CSN: [id number]  Arrival date & time 05/19/12  1154   First MD Initiated Contact with Patient 05/19/12 1353      Chief Complaint  Patient presents with  . Arm Pain    (Consider location/radiation/quality/duration/timing/severity/associated sxs/prior treatment) HPI Comments: Tina Roberts is a 72 y.o. Female presenting with persistent pain with lifting and abduction of her left arm in her distal humerus.  She denies specific injury,  The pain has been present for at least the past 3 weeks.  She was seen by her pcp for this and was placed on diclofenac gel and given physical therapy exercises which involve curling light weights which have not improved her pain symptoms.  She has not contacted her pcp regarding her treatment results.  She denies radiation of pain and has no chest pain,  Shortness of breath,  Neck pain,  Weakness or numbness in the extremity.  She is pain free at rest.  Pain is sharp with active ROM,  Worst when she attempts to reach upward and outward.     The history is provided by the patient.    Past Medical History  Diagnosis Date  . Hypertension   . Hyperlipidemia   . Osteoporosis   . Kidney stone   . Hypothyroidism   . Hepatic lesion   . History of gallstones   . Vitiligo   . GERD (gastroesophageal reflux disease)   . Vitamin D deficiency     Past Surgical History  Procedure Laterality Date  . Tubal ligation  1973  . Leg surgery      right    Family History  Problem Relation Age of Onset  . Colon cancer Mother 40  . Diabetes Sister   . Diabetes Mother     History  Substance Use Topics  . Smoking status: Never Smoker   . Smokeless tobacco: Never Used  . Alcohol Use: Yes     Comment: occas    OB History   Grav Para Term Preterm Abortions TAB SAB Ect Mult Living                  Review of Systems  Constitutional: Negative for fever and activity change.  Respiratory: Negative for shortness of breath.   Cardiovascular:  Negative for chest pain.  Musculoskeletal: Positive for arthralgias. Negative for myalgias and joint swelling.  Skin: Negative for color change, rash and wound.  Neurological: Negative for weakness and numbness.    Allergies  Review of patient's allergies indicates no known allergies.  Home Medications   Current Outpatient Rx  Name  Route  Sig  Dispense  Refill  . alendronate (FOSAMAX) 70 MG tablet   Oral   Take 70 mg by mouth every 7 (seven) days. Take with a full glass of water on an empty stomach. Thursday         . Calcium Carb-Cholecalciferol (OYSTER SHELL CALCIUM + D3) 500-400 MG-UNIT TABS   Oral   Take 1 tablet by mouth daily.         . diclofenac sodium (VOLTAREN) 1 % GEL   Topical   Apply 2 g topically 2 (two) times daily.         Marland Kitchen levothyroxine (SYNTHROID, LEVOTHROID) 75 MCG tablet   Oral   Take 75 mcg by mouth daily.         Marland Kitchen lisinopril (PRINIVIL,ZESTRIL) 5 MG tablet   Oral   Take 2.5 mg by mouth daily.         Marland Kitchen  omeprazole (PRILOSEC) 20 MG capsule   Oral   Take 20 mg by mouth daily before breakfast.         . HYDROcodone-acetaminophen (NORCO/VICODIN) 5-325 MG per tablet      Take 1/2 to 1 tablet by mouth every 6 hours as needed for pain   10 tablet   0     BP 150/88  Pulse 89  Temp(Src) 98.1 F (36.7 C) (Oral)  Resp 19  SpO2 97%  Physical Exam  Constitutional: She appears well-developed and well-nourished.  HENT:  Head: Atraumatic.  Eyes: Conjunctivae are normal.  Neck: Normal range of motion. Neck supple. No spinous process tenderness and no muscular tenderness present.  Cardiovascular:  Pulses equal bilaterally  Musculoskeletal: She exhibits tenderness. She exhibits no edema.       Right upper arm: She exhibits tenderness. She exhibits no bony tenderness, no swelling, no edema and no deformity.  TTP over distal bicep muscle and tendon without obvious deformity or shortening of bicep muscle.  Grip strength equal.  Less than 3  sec cap refill in left digits.  No pain with forearm supination.  Pain triggered with active abduction of extremity.  Lymphadenopathy:    She has no cervical adenopathy.  Neurological: She is alert. She has normal strength. She displays normal reflexes. No sensory deficit.  Reflex Scores:      Bicep reflexes are 2+ on the right side and 2+ on the left side.      Brachioradialis reflexes are 2+ on the right side and 2+ on the left side. Equal strength  Skin: Skin is warm and dry.  Psychiatric: She has a normal mood and affect.    ED Course  Procedures (including critical care time)  Labs Reviewed - No data to display Dg Elbow Complete Left  05/19/2012  *RADIOLOGY REPORT*  Clinical Data: Arm pain.  LEFT ELBOW - COMPLETE 3+ VIEW  Comparison: None.  Findings: No acute bony abnormality.  Specifically, no fracture, subluxation, or dislocation.  Soft tissues are intact.  No joint effusion.  Joint spaces are maintained.  IMPRESSION: No acute bony abnormality.   Original Report Authenticated By: Charlett Nose, M.D.      1. Biceps tendonitis on left       MDM  Patients labs and/or radiological studies were reviewed during the medical decision making and disposition process. Pt understands need to f/u with pcp for further management of her pain.  She states she presented here for help with pain control.  Discussed xray results and that she may need further imaging such as mri if sx persist,  Advised to discuss further tx options with pcp.  Prescribed small amount of hydrocodone,  Caution given regarding sedation.  Pt to call her pcp this week for recheck.  Exam most consistent with biceps tendonitis,  But not explained by worsened sx with abduction rather than with forearm flexion.  Suspect deeper abductor injury.  No peripheral edema, distal pulses full, doubt vascular source of sx.  No neuro deficit.        Burgess Amor, PA 05/21/12 1124

## 2012-05-23 NOTE — ED Provider Notes (Signed)
Medical screening examination/treatment/procedure(s) were conducted as a shared visit with non-physician practitioner(s) and myself.  I personally evaluated the patient during the encounter Pt with left upper arm pain w movement shoulder/lifting for the past several weeks. No cp or sob. Good rom. No focal bony tenderness.    Suzi Roots, MD 05/23/12 [id number]

## 2012-06-27 ENCOUNTER — Other Ambulatory Visit: Payer: Self-pay

## 2012-06-27 DIAGNOSIS — Z1231 Encounter for screening mammogram for malignant neoplasm of breast: Secondary | ICD-10-CM

## 2012-07-05 ENCOUNTER — Other Ambulatory Visit: Payer: Self-pay | Admitting: Internal Medicine

## 2012-07-23 ENCOUNTER — Encounter: Payer: Self-pay | Admitting: *Deleted

## 2012-07-24 ENCOUNTER — Encounter: Payer: Self-pay | Admitting: Internal Medicine

## 2012-07-24 ENCOUNTER — Ambulatory Visit (INDEPENDENT_AMBULATORY_CARE_PROVIDER_SITE_OTHER): Payer: Medicare HMO | Admitting: Internal Medicine

## 2012-07-24 VITALS — BP 110/62 | HR 62 | Temp 97.9°F | Resp 14 | Ht 62.5 in | Wt 122.0 lb

## 2012-07-24 DIAGNOSIS — E559 Vitamin D deficiency, unspecified: Secondary | ICD-10-CM | POA: Insufficient documentation

## 2012-07-24 DIAGNOSIS — M1990 Unspecified osteoarthritis, unspecified site: Secondary | ICD-10-CM

## 2012-07-24 DIAGNOSIS — M8580 Other specified disorders of bone density and structure, unspecified site: Secondary | ICD-10-CM | POA: Insufficient documentation

## 2012-07-24 DIAGNOSIS — K219 Gastro-esophageal reflux disease without esophagitis: Secondary | ICD-10-CM | POA: Insufficient documentation

## 2012-07-24 DIAGNOSIS — E782 Mixed hyperlipidemia: Secondary | ICD-10-CM | POA: Insufficient documentation

## 2012-07-24 DIAGNOSIS — M949 Disorder of cartilage, unspecified: Secondary | ICD-10-CM

## 2012-07-24 DIAGNOSIS — I10 Essential (primary) hypertension: Secondary | ICD-10-CM

## 2012-07-24 DIAGNOSIS — E039 Hypothyroidism, unspecified: Secondary | ICD-10-CM

## 2012-07-24 DIAGNOSIS — E785 Hyperlipidemia, unspecified: Secondary | ICD-10-CM

## 2012-07-24 DIAGNOSIS — M899 Disorder of bone, unspecified: Secondary | ICD-10-CM

## 2012-07-24 MED ORDER — DICLOFENAC SODIUM 1 % TD GEL: 2.0000 g | Freq: Two times a day (BID) | TRANSDERMAL | Status: DC

## 2012-07-24 MED ORDER — OMEPRAZOLE 20 MG PO CPDR: 20.0000 mg | DELAYED_RELEASE_CAPSULE | Freq: Every day | ORAL | Status: DC

## 2012-07-24 MED ORDER — TRAMADOL HCL 50 MG PO TABS: 50.0000 mg | ORAL_TABLET | Freq: Three times a day (TID) | ORAL | Status: DC | PRN

## 2012-07-24 NOTE — Assessment & Plan Note (Signed)
With osteopenia. Continue calcium-vit d supplement. Check vitamin d level prior to next visit

## 2012-07-24 NOTE — Progress Notes (Signed)
  Subjective:    Patient ID: Tina Roberts, female    DOB: [birth year], 72 y.o.   MRN: [id number]  Chief complaint- routine follow up  HPI She feels like mucus is stuck in her throat and needs to clear it out. She has been coughing it out with clear mucus. Denies smoking. Denies any heartburn or chest pain. No runny nose. No pain or stuffiness in sinuses. Pain in her arm and shoulder has improved Compliant with her medications. Taking omeprazole 10 mg daily only instead of 20 mg daily  Of note- Has not taken pneumococcal and shingles vaccine. She will review and let us know next visit.   Review of Systems  Constitutional: Negative for activity change, appetite change and fatigue.  HENT: Negative for ear pain, congestion, trouble swallowing, neck pain and sinus pressure.   Eyes: Negative for discharge and visual disturbance.  Respiratory: Negative for cough and shortness of breath.   Cardiovascular: Negative for chest pain and palpitations.  Gastrointestinal: Negative for nausea, vomiting, abdominal pain and constipation.  Genitourinary: Negative for dysuria.  Musculoskeletal: Positive for arthralgias. Negative for joint swelling.  Skin: Negative for rash and wound.  Neurological: Negative for dizziness and light-headedness.  Hematological: Negative for adenopathy.  Psychiatric/Behavioral: Negative for sleep disturbance and agitation.       Objective:   Physical Exam  Constitutional: She is oriented to person, place, and time. She appears well-developed and well-nourished. No distress.  HENT:  Head: Normocephalic and atraumatic.  Mouth/Throat: Oropharynx is clear and moist. No oropharyngeal exudate.  Eyes: Conjunctivae are normal. Pupils are equal, round, and reactive to light.  Neck: Normal range of motion. Neck supple. No tracheal deviation present.  Cardiovascular: Normal rate and regular rhythm.   Pulmonary/Chest: Effort normal and breath sounds normal.  Abdominal: Soft.  Bowel sounds are normal.  Musculoskeletal: Normal range of motion. She exhibits no edema.  Lymphadenopathy:    She has no cervical adenopathy.  Neurological: She is alert and oriented to person, place, and time.  Skin: Skin is warm and dry. She is not diaphoretic.  Psychiatric: She has a normal mood and affect. Her behavior is normal.   BP 110/62  Pulse 62  Temp(Src) 97.9 F (36.6 C) (Oral)  Resp 14  Ht 5' 2.5" (1.588 m)  Wt 122 lb (55.339 kg)  BMI 21.94 kg/m2  SpO2 99%     Assessment & Plan:  Essential hypertension, benign Blood pressure well controlled. Continue lisinopril 5 mg daily for now. Check bmp prior to next visit  GERD (gastroesophageal reflux disease) Persists. On alendronate which could also be contributing to this. Was prescribed omeprazole 20 mg daily but taking 10 mg daily. Will increase this to 20 mg daily and reassess. Explained instruction on taking alendronate.  Hypothyroidism Recheck tsh prior to next visit. Continue levothyroxine at current dose for now  Osteoarthritis Persists but imporved from before. D/c vicodin. Will have her on prn tramadol for now. Reassess. Weight bearing exercise encouraged. Refill on voltaren gel provided  Hyperlipidemia Diet and exercise counselled. Check flp prior to next visit. Currently off all medications  Unspecified vitamin D deficiency With osteopenia. Continue calcium-vit d supplement. Check vitamin d level prior to next visit  Osteopenia On alendronate and tolerating it fine, reviewed dexa report, continue ca-vit d supplement. Encouraged weight bearing exercise

## 2012-07-24 NOTE — Assessment & Plan Note (Signed)
Blood pressure well controlled. Continue lisinopril 5 mg daily for now. Check bmp prior to next visit

## 2012-07-24 NOTE — Assessment & Plan Note (Signed)
Persists. On alendronate which could also be contributing to this. Was prescribed omeprazole 20 mg daily but taking 10 mg daily. Will increase this to 20 mg daily and reassess. Explained instruction on taking alendronate.

## 2012-07-24 NOTE — Assessment & Plan Note (Signed)
Persists but imporved from before. D/c vicodin. Will have her on prn tramadol for now. Reassess. Weight bearing exercise encouraged. Refill on voltaren gel provided

## 2012-07-24 NOTE — Assessment & Plan Note (Signed)
Recheck tsh prior to next visit. Continue levothyroxine at current dose for now

## 2012-07-24 NOTE — Assessment & Plan Note (Signed)
On alendronate and tolerating it fine, reviewed dexa report, continue ca-vit d supplement. Encouraged weight bearing exercise

## 2012-07-24 NOTE — Assessment & Plan Note (Signed)
Diet and exercise counselled. Check flp prior to next visit. Currently off all medications

## 2012-07-30 ENCOUNTER — Ambulatory Visit
Admission: RE | Admit: 2012-07-30 | Discharge: 2012-07-30 | Disposition: A | Discharge status: Home / Self Care | Payer: Medicare HMO | Source: Ambulatory Visit

## 2012-07-30 ENCOUNTER — Other Ambulatory Visit: Payer: Self-pay | Admitting: *Deleted

## 2012-07-30 DIAGNOSIS — Z1231 Encounter for screening mammogram for malignant neoplasm of breast: Secondary | ICD-10-CM

## 2012-07-30 MED ORDER — ALENDRONATE SODIUM 70 MG PO TABS: ORAL_TABLET | ORAL | Status: DC

## 2012-09-05 ENCOUNTER — Ambulatory Visit (INDEPENDENT_AMBULATORY_CARE_PROVIDER_SITE_OTHER): Payer: Medicare HMO | Admitting: Gastroenterology

## 2012-09-05 ENCOUNTER — Encounter: Payer: Self-pay | Admitting: Gastroenterology

## 2012-09-05 VITALS — BP 132/72 | HR 84 | Ht 62.0 in | Wt 118.1 lb

## 2012-09-05 DIAGNOSIS — K219 Gastro-esophageal reflux disease without esophagitis: Secondary | ICD-10-CM

## 2012-09-05 DIAGNOSIS — R6889 Other general symptoms and signs: Secondary | ICD-10-CM

## 2012-09-05 MED ORDER — OMEPRAZOLE 20 MG PO CPDR: 40.0000 mg | DELAYED_RELEASE_CAPSULE | Freq: Every day | ORAL | Status: DC

## 2012-09-05 NOTE — Patient Instructions (Addendum)
Change your omeprazole to 40 mg twice daily for 3 months. We have sent you a new prescription to your pharmacy. CC:  Oneal Grout MD

## 2012-09-05 NOTE — Progress Notes (Signed)
History of Present Illness: This is a 72 year old female who complains of frequent throat clearing and mucus in her throat. She relates soreness on both sides of her neck. She occasionally notes a problem swallowing water with a medium regurgitation. After coughing and clearing her throat several times she noted the tend of blood in her saliva last week. A barium esophagram performed in November 2013 showed findings consistent reflux. A modified barium swallowing study was unremarkable with November 2013. She really late symptoms of heartburn that abated when she began using omeprazole 20 mg daily. Denies weight loss, abdominal pain, constipation, diarrhea, change in stool caliber, melena, hematochezia, nausea, vomiting, dysphagia, chest pain.  Current Medications, Allergies, Past Medical History, Past Surgical History, Family History and Social History were reviewed in [employer] record.  Physical Exam: General: Well developed , well nourished, no acute distress Head: Normocephalic and atraumatic Eyes:  sclerae anicteric, EOMI Ears: Normal auditory acuity Mouth: No deformity or lesions Lungs: Clear throughout to auscultation Heart: Regular rate and rhythm; no murmurs, rubs or bruits Abdomen: Soft, non tender and non distended. No masses, hepatosplenomegaly or hernias noted. Normal Bowel sounds Musculoskeletal: Symmetrical with no gross deformities  Pulses:  Normal pulses noted Extremities: No clubbing, cyanosis, edema or deformities noted Neurological: Alert oriented x 4, grossly nonfocal Psychological:  Alert and cooperative. Normal mood and affect  Assessment and Recommendations:  1. Throat clearing, neck pain, mucus. It is possible that this is GERD with LPR however the symptoms are atypical. She could have an ENT disorder. Intensify all antireflux measures and increase omeprazole 40 mg twice a day for 3 months and assess response.   2. Colorectal cancer screening,  elevated risk, due to a first degree relative with colon cancer. Five-year surveillance colonoscopy due October 2016.

## 2012-10-06 ENCOUNTER — Other Ambulatory Visit: Payer: Self-pay | Admitting: Internal Medicine

## 2012-10-08 ENCOUNTER — Other Ambulatory Visit: Payer: Self-pay | Admitting: Internal Medicine

## 2012-10-22 ENCOUNTER — Other Ambulatory Visit: Payer: Medicare HMO

## 2012-10-24 ENCOUNTER — Ambulatory Visit (INDEPENDENT_AMBULATORY_CARE_PROVIDER_SITE_OTHER): Payer: Medicare HMO | Admitting: Internal Medicine

## 2012-10-24 ENCOUNTER — Encounter: Payer: Self-pay | Admitting: Internal Medicine

## 2012-10-24 VITALS — BP 126/84 | HR 65 | Temp 98.2°F | Resp 14 | Ht 62.0 in | Wt 117.6 lb

## 2012-10-24 DIAGNOSIS — N179 Acute kidney failure, unspecified: Secondary | ICD-10-CM

## 2012-10-24 DIAGNOSIS — M899 Disorder of bone, unspecified: Secondary | ICD-10-CM

## 2012-10-24 DIAGNOSIS — E039 Hypothyroidism, unspecified: Secondary | ICD-10-CM

## 2012-10-24 DIAGNOSIS — M949 Disorder of cartilage, unspecified: Secondary | ICD-10-CM

## 2012-10-24 DIAGNOSIS — E785 Hyperlipidemia, unspecified: Secondary | ICD-10-CM

## 2012-10-24 DIAGNOSIS — I10 Essential (primary) hypertension: Secondary | ICD-10-CM

## 2012-10-24 DIAGNOSIS — M8580 Other specified disorders of bone density and structure, unspecified site: Secondary | ICD-10-CM

## 2012-10-24 MED ORDER — AMLODIPINE BESYLATE 5 MG PO TABS: 5.0000 mg | ORAL_TABLET | Freq: Every day | ORAL | Status: DC

## 2012-10-24 MED ORDER — LISINOPRIL 5 MG PO TABS: 2.5000 mg | ORAL_TABLET | Freq: Every day | ORAL | Status: DC

## 2012-10-24 NOTE — Patient Instructions (Signed)
Take half of your lisinopril 2.5 mg daily from today onwards for 2 weeks and stop it on 11/06/12  Cut down on your salt intake, processed food  Stop taking aleve, advil, ibuprofen  Start taking amlodipine 5 mg daily from today onwards for your blood pressure

## 2012-10-24 NOTE — Progress Notes (Signed)
Patient ID: Tina Roberts, female   DOB: [birth year], 72 y.o.   MRN: [id number]  Chief Complaint  Patient presents with  . Medical Managment of Chronic Issues   HPI She feels like fluid collecting with her saliva at the back of her throat and irritates her. She then feels her mouth is full of spit. No change with change of position. No aggrevating or relieving factor. It has been there for whole day recently and is bothering her. She has been seen by GI for this and her PPI dose was increased to 40 mg daily and this has provided no relief. She has been on increased dose for a month. Sometimes she feels food particle coming up. Denies any difficult swallowing Denies any sore throat Denies smoking.  Denies any heartburn or chest pain.  No runny nose.  No pain or stuffiness in sinuses. Compliant with her medications.  Her barium swallow test has been normal Reviewed gi result  Has not taken her pneumococcal vaccine  Review of Systems  Constitutional: Negative for activity change, appetite change and fatigue.  HENT: Negative for ear pain, congestion, trouble swallowing, neck pain and sinus pressure.   Eyes: Negative for discharge and visual disturbance.  Respiratory: Negative for cough and shortness of breath.   Cardiovascular: Negative for chest pain and palpitations.  Gastrointestinal: Negative for nausea, vomiting, abdominal pain and constipation.  Genitourinary: Negative for dysuria.  Musculoskeletal: Positive for arthralgias. Negative for joint swelling.  Skin: Negative for rash and wound.  Neurological: Negative for dizziness and light-headedness.  Hematological: Negative for adenopathy.  Psychiatric/Behavioral: Negative for sleep disturbance and agitation  No Known Allergies  Physical Exam   BP 126/84  Pulse 65  Temp(Src) 98.2 F (36.8 C) (Oral)  Resp 14  Ht 5\' 2"  (1.575 m)  Wt 117 lb 9.6 oz (53.343 kg)  BMI 21.5 kg/m2  Constitutional: She is oriented to person, place,  and time. She appears well-developed and well-nourished. No distress.  HENT:   Head: Normocephalic and atraumatic.   Mouth/Throat: Oropharynx is clear and moist. No oropharyngeal exudate.  Eyes: Conjunctivae are normal. Pupils are equal, round, and reactive to light.  Neck: Normal range of motion. Neck supple. No tracheal deviation present.  Cardiovascular: Normal rate and regular rhythm.   Pulmonary/Chest: Effort normal and breath sounds normal.  Abdominal: Soft. Bowel sounds are normal.  Musculoskeletal: Normal range of motion. She exhibits no edema.  Lymphadenopathy:    She has no cervical adenopathy.  Neurological: She is alert and oriented to person, place, and time.  Skin: Skin is warm and dry. She is not diaphoretic.  Psychiatric: She has a normal mood and affect. Her behavior is normal.   Labs- 10/22/12-  Wbc 5.1, hb 12.1, hct 36.5, plt 211, glu 83, na 141, k 4.2, cl 104, co2 25, bun 35, cr 1.32, egfr 83, t.chol 186, tg 106, hdl 63, ldl 102, tsh 0.633, vit d 40.9  ASSESSMENT/PLAN  Essential hypertension, benign Blood pressure well controlled. Reviewed her labs. With worsening new renal impairment. Will change her lisinopril to 2.5 mg daily for 2 weeks and stop it. Will start her on amlodipine 5 mg daily for now. Check bmp prior to next visit. Avoid nsaids, cut down on salt.   Acute renal failure New finding. Her HTN could be contributing to this. Will avoid NSAIDs for now, encourage hydration and reassess renal function in a month  GERD (gastroesophageal reflux disease) Persists. On alendronate which could also be contributing to this. Was  prescribed omeprazole 40 mg daily for now by G. To follow with gi in 2 months  Hypothyroidism tsh within normal range for now. Continue levothyroxine at current dose for now  Osteoarthritis Persists but imporved from before. continue prn tramadol for now. Reassess. Weight bearing exercise encouraged. Refill on voltaren gel provided. Avoid  NSAIDS  Osteopenia On alendronate and tolerating it fine, reviewed dexa report, continue ca-vit d supplement. Encouraged weight bearing exercise  Will provide pneumococcal vaccine next visit

## 2012-11-01 ENCOUNTER — Other Ambulatory Visit (HOSPITAL_BASED_OUTPATIENT_CLINIC_OR_DEPARTMENT_OTHER): Payer: Self-pay | Admitting: Internal Medicine

## 2012-11-06 ENCOUNTER — Other Ambulatory Visit (HOSPITAL_BASED_OUTPATIENT_CLINIC_OR_DEPARTMENT_OTHER): Payer: Self-pay | Admitting: Internal Medicine

## 2012-11-16 ENCOUNTER — Other Ambulatory Visit: Payer: Medicare HMO

## 2012-11-16 DIAGNOSIS — N179 Acute kidney failure, unspecified: Secondary | ICD-10-CM

## 2012-11-17 LAB — BASIC METABOLIC PANEL
BUN/Creatinine Ratio: 15 (ref 11–26)
BUN: 19 mg/dL (ref 8–27)
CO2: 24 mmol/L (ref 18–29)
Calcium: 9.3 mg/dL (ref 8.6–10.2)
Chloride: 101 mmol/L (ref 97–108)
Creatinine, Ser: 1.24 mg/dL — ABNORMAL HIGH (ref 0.57–1.00)
GFR calc Af Amer: 51 mL/min/{1.73_m2} — ABNORMAL LOW (ref >59)
GFR calc non Af Amer: 44 mL/min/{1.73_m2} — ABNORMAL LOW (ref >59)
Glucose: 84 mg/dL (ref 65–99)
Potassium: 4 mmol/L (ref 3.5–5.2)
Sodium: 139 mmol/L (ref 134–144)

## 2012-11-20 ENCOUNTER — Ambulatory Visit (INDEPENDENT_AMBULATORY_CARE_PROVIDER_SITE_OTHER): Payer: Medicare Other | Admitting: Internal Medicine

## 2012-11-20 ENCOUNTER — Encounter: Payer: Self-pay | Admitting: Internal Medicine

## 2012-11-20 VITALS — BP 124/84 | HR 67 | Temp 98.1°F | Resp 14 | Ht 62.0 in | Wt 121.2 lb

## 2012-11-20 DIAGNOSIS — N183 Chronic kidney disease, stage 3 unspecified: Secondary | ICD-10-CM

## 2012-11-20 DIAGNOSIS — M1990 Unspecified osteoarthritis, unspecified site: Secondary | ICD-10-CM

## 2012-11-20 DIAGNOSIS — I10 Essential (primary) hypertension: Secondary | ICD-10-CM

## 2012-11-20 DIAGNOSIS — Z23 Encounter for immunization: Secondary | ICD-10-CM | POA: Insufficient documentation

## 2012-11-20 DIAGNOSIS — K219 Gastro-esophageal reflux disease without esophagitis: Secondary | ICD-10-CM

## 2012-11-20 MED ORDER — TRAMADOL HCL 50 MG PO TABS: 50.0000 mg | ORAL_TABLET | Freq: Three times a day (TID) | ORAL | Status: DC | PRN

## 2012-11-20 NOTE — Progress Notes (Signed)
Patient ID: Tina Roberts, female   DOB: [birth year], 72 y.o.   MRN: [id number]  Chief Complaint  Patient presents with  . Hypertension  . Acute Renal Failure    No Known Allergies  HPI Her bp medication was changed last visit. She is tolerating it well. bp is well controlled this visit. Reviewed her labs. Worsening renal function. Has stopped taking NSAIDs.  Her reflux is under control Her pain has also improved Has not taken her pneumococcal vaccine  Review of Systems   Constitutional: Negative for activity change, appetite change and fatigue.   HENT: Negative for ear pain, congestion, trouble swallowing, neck pain and sinus pressure.    Eyes: Negative for discharge and visual disturbance.   Respiratory: Negative for cough and shortness of breath.    Cardiovascular: Negative for chest pain and palpitations.   Gastrointestinal: Negative for nausea, vomiting, abdominal pain and constipation.   Genitourinary: Negative for dysuria.   Musculoskeletal: Positive for arthralgias. Negative for joint swelling.   Skin: Negative for rash and wound.   Neurological: Negative for dizziness and light-headedness.   Hematological: Negative for adenopathy.   Psychiatric/Behavioral: Negative for sleep disturbance and agitation  BP 124/84  Pulse 67  Temp(Src) 98.1 F (36.7 C) (Oral)  Resp 14  Ht 5\' 2"  (1.575 m)  Wt 121 lb 3.2 oz (54.976 kg)  BMI 22.16 kg/m2  Constitutional: She is oriented to person, place, and time. She appears well-developed and well-nourished. No distress.   HENT:   Head: Normocephalic and atraumatic.   Mouth/Throat: Oropharynx is clear and moist. No oropharyngeal exudate.   Eyes: Conjunctivae are normal. Pupils are equal, round, and reactive to light.   Neck: Normal range of motion. Neck supple. No tracheal deviation present.   Cardiovascular: Normal rate and regular rhythm.    Pulmonary/Chest: Effort normal and breath sounds normal.   Abdominal: Soft. Bowel sounds are  normal. No abdominal bruit Musculoskeletal: Normal range of motion. She exhibits no edema.  Lymphadenopathy:    She has no cervical adenopathy.  Neurological: She is alert and oriented to person, place, and time.   Skin: Skin is warm and dry. She is not diaphoretic.  Psychiatric: She has a normal mood and affect. Her behavior is normal.   Labs- 10/22/12-  Wbc 5.1, hb 12.1, hct 36.5, plt 211, glu 83, na 141, k 4.2, cl 104, co2 25, bun 35, cr 1.32, egfr 83, t.chol 186, tg 106, hdl 63, ldl 102, tsh 0.633, vit d 40.9  ASSESSMENT/PLAN  Essential hypertension, benign Blood pressure well controlled. Reviewed her labs. Continue amlodipine 5 mg daily for now. Check bmp prior to next visit. Avoid nsaids, cut down on salt.   CKD stage 3 With worsening renal function, it could be her long standing HTN and use of NSAIDs contributing to this. She is not taking NSAIDs any more. Will get renal usg to assess for renal size and rule out mass effect and obstructive causes. Will monitor her function.   GERD (gastroesophageal reflux disease) Improved. Continue omeprazole 40 mg daily   Osteoarthritis Improved with tramadol and exercise. Continue this  Pneumococcal vaccine provided

## 2012-11-21 ENCOUNTER — Ambulatory Visit
Admission: RE | Admit: 2012-11-21 | Discharge: 2012-11-21 | Disposition: A | Discharge status: Home / Self Care | Payer: Medicare Other | Source: Ambulatory Visit | Attending: Internal Medicine | Admitting: Internal Medicine

## 2012-11-21 DIAGNOSIS — N183 Chronic kidney disease, stage 3 unspecified: Secondary | ICD-10-CM

## 2012-12-07 ENCOUNTER — Telehealth: Payer: Self-pay | Admitting: *Deleted

## 2012-12-07 NOTE — Telephone Encounter (Signed)
11/21/2012---Renal Ultrasound: No hydronephrosis. Echogenic renal parenchyma consistent with chronic renal medical disease.  Per Dr. Glade Lloyd:  Your renal ultrasound shows changes suggestive of chronic kidney disease. No stones or obstruction noted.  Amy Sedonia Small Notified patient on 11/22/2012

## 2013-01-01 ENCOUNTER — Telehealth: Payer: Self-pay | Admitting: Gastroenterology

## 2013-01-01 DIAGNOSIS — K219 Gastro-esophageal reflux disease without esophagitis: Secondary | ICD-10-CM

## 2013-01-01 MED ORDER — OMEPRAZOLE 40 MG PO CPDR: 40.0000 mg | DELAYED_RELEASE_CAPSULE | Freq: Two times a day (BID) | ORAL | Status: DC

## 2013-01-01 NOTE — Telephone Encounter (Signed)
Patient is due for 3 month follow up and scheduled so refill sent to patient's pharmacy for omeprazole 40 mg twice daily.

## 2013-01-02 ENCOUNTER — Other Ambulatory Visit: Payer: Self-pay | Admitting: Gastroenterology

## 2013-01-02 ENCOUNTER — Other Ambulatory Visit: Payer: Self-pay | Admitting: *Deleted

## 2013-01-02 MED ORDER — LEVOTHYROXINE SODIUM 75 MCG PO TABS: ORAL_TABLET | ORAL | Status: DC

## 2013-02-05 ENCOUNTER — Encounter: Payer: Self-pay | Admitting: Gastroenterology

## 2013-02-05 ENCOUNTER — Ambulatory Visit (INDEPENDENT_AMBULATORY_CARE_PROVIDER_SITE_OTHER): Payer: Medicare Other | Admitting: Gastroenterology

## 2013-02-05 VITALS — BP 104/70 | HR 68 | Ht 62.0 in | Wt 119.8 lb

## 2013-02-05 DIAGNOSIS — R6889 Other general symptoms and signs: Secondary | ICD-10-CM

## 2013-02-05 DIAGNOSIS — F458 Other somatoform disorders: Secondary | ICD-10-CM

## 2013-02-05 DIAGNOSIS — R09A2 Foreign body sensation, throat: Secondary | ICD-10-CM

## 2013-02-05 DIAGNOSIS — R198 Other specified symptoms and signs involving the digestive system and abdomen: Secondary | ICD-10-CM

## 2013-02-05 DIAGNOSIS — R0989 Other specified symptoms and signs involving the circulatory and respiratory systems: Secondary | ICD-10-CM

## 2013-02-05 NOTE — Patient Instructions (Signed)
You have been scheduled to Dr. Pollyann Kennedy at [facility] on 02/07/13 at 1:10pm. Please arrive 15 minutes prior to your appointment for registration. There location is [address] [place], [place] [address]. Please bring insurance cards and co-pay. If you need to reschedule or cancel please call [phone].  Thank you for choosing me and [facility].  Venita Lick. Pleas Koch., MD., Clementeen Graham

## 2013-02-05 NOTE — Progress Notes (Signed)
    History of Present Illness: This is a 71 year old female who complains of frequent throat clearing and mucus in her throat. She relates occasional soreness on both sides of her neck. She occasionally notes a problem swallowing water which produces more saliva and mucus and occasionally to choking. She was placed on omeprazole 40 mg twice daily along with strict antireflux measures in June and her symptoms have not changed.  Current Medications, Allergies, Past Medical History, Past Surgical History, Family History and Social History were reviewed in [employer] record.  Physical Exam: General: Well developed , well nourished, no acute distress Head: Normocephalic and atraumatic Eyes:  sclerae anicteric, EOMI Ears: Normal auditory acuity Mouth: No deformity or lesions Lungs: Clear throughout to auscultation Heart: Regular rate and rhythm; no murmurs, rubs or bruits Abdomen: Soft, non tender and non distended. No masses, hepatosplenomegaly or hernias noted. Normal Bowel sounds Musculoskeletal: Symmetrical with no gross deformities  Pulses:  Normal pulses noted Extremities: No clubbing, cyanosis, edema or deformities noted Neurological: Alert oriented x 4, grossly nonfocal Psychological:  Alert and cooperative. Normal mood and affect  Assessment and Recommendations:  1. Globus sensation, throat clearing, frequent need to expectorate. No response to PPI BID for 5 months.  I do not feel her symptoms are related to acid reflux or another GI disorder. ENT referral and return to her PCP for revaluation.

## 2013-02-27 ENCOUNTER — Encounter: Payer: Self-pay | Admitting: Internal Medicine

## 2013-02-27 ENCOUNTER — Ambulatory Visit (INDEPENDENT_AMBULATORY_CARE_PROVIDER_SITE_OTHER): Payer: Medicare Other | Admitting: Internal Medicine

## 2013-02-27 VITALS — BP 112/70 | HR 72 | Temp 98.1°F | Resp 14 | Wt 121.4 lb

## 2013-02-27 DIAGNOSIS — E039 Hypothyroidism, unspecified: Secondary | ICD-10-CM

## 2013-02-27 DIAGNOSIS — K219 Gastro-esophageal reflux disease without esophagitis: Secondary | ICD-10-CM

## 2013-02-27 DIAGNOSIS — M8580 Other specified disorders of bone density and structure, unspecified site: Secondary | ICD-10-CM

## 2013-02-27 DIAGNOSIS — R202 Paresthesia of skin: Secondary | ICD-10-CM | POA: Insufficient documentation

## 2013-02-27 DIAGNOSIS — I10 Essential (primary) hypertension: Secondary | ICD-10-CM

## 2013-02-27 DIAGNOSIS — R209 Unspecified disturbances of skin sensation: Secondary | ICD-10-CM

## 2013-02-27 DIAGNOSIS — Z23 Encounter for immunization: Secondary | ICD-10-CM | POA: Insufficient documentation

## 2013-02-27 DIAGNOSIS — M899 Disorder of bone, unspecified: Secondary | ICD-10-CM

## 2013-02-27 DIAGNOSIS — M1990 Unspecified osteoarthritis, unspecified site: Secondary | ICD-10-CM

## 2013-02-27 MED ORDER — TETANUS-DIPHTHERIA TOXOIDS TD 2-2 LF/0.5ML IM SUSP: 0.5000 mL | Freq: Once | INTRAMUSCULAR | Status: DC

## 2013-02-27 NOTE — Progress Notes (Signed)
Patient ID: Tina Roberts, female   DOB: 03-05-1941, 72 y.o.   MRN: [id number]    Chief Complaint  Patient presents with  . Medical Managment of Chronic Issues    No Known Allergies  HPI 72 y/o female is here for routine visit. She has seen GI for ? Globus sensation. She is taking omeprazole 40 mg bid for now. Her symptoms have improved with solid food, only occurs at times with liquids She has pin and needles sensation on her thighs on and off at night, has tingling sensation and she has to move her leg or get out of bed and walk for it to go away. No numbness noted. No falls No other complaints Has been compliant with her medications uptodate with pneumococcal and flu vaccine  Review of Systems  Constitutional: Negative for fever, chills, weight loss, malaise/fatigue and diaphoresis.  HENT: Negative for congestion, hearing loss and sore throat.   Eyes: Negative for blurred vision, double vision and discharge.  Respiratory: Negative for cough, sputum production, shortness of breath and wheezing.  wears glasses Cardiovascular: Negative for chest pain, palpitations, orthopnea and leg swelling.  Gastrointestinal: Negative for nausea, vomiting, abdominal pain, diarrhea and constipation. occassional indigestion and heartburn Genitourinary: Negative for dysuria, urgency, frequency and flank pain.  Musculoskeletal: Negative for back pain, falls, joint pain and myalgias.  Skin: Negative for itching and rash. has vitligo Neurological:  Negative for dizziness, focal weakness and headaches.  Psychiatric/Behavioral: Negative for depression and memory loss. The patient is not nervous/anxious.    Past Medical History  Diagnosis Date  . Hypertension   . Hyperlipidemia   . Osteoporosis   . Kidney stone   . Hypothyroidism   . Hepatic lesion   . History of gallstones   . Vitiligo   . GERD (gastroesophageal reflux disease)   . Vitamin D deficiency   . Myalgia and myositis, unspecified   .  Osteoporosis, unspecified   . Reflux esophagitis   . Other dysphagia   . Disorder of bone and cartilage, unspecified   . Lacrimal disorder   . Conjunctivitis unspecified   . Osteopenia   . Unspecified vitamin D deficiency   . Vitiligo   . Osteoarthrosis, unspecified whether generalized or localized, unspecified site   . Pain in joint, forearm   . Unspecified disorder of kidney and ureter   . Unspecified hypothyroidism   . Other and unspecified hyperlipidemia   . Unspecified essential hypertension    Past Surgical History  Procedure Laterality Date  . Tubal ligation  1973  . Leg surgery      right    Current Outpatient Prescriptions on File Prior to Visit  Medication Sig Dispense Refill  . alendronate (FOSAMAX) 70 MG tablet Take with a full glass of water on an empty stomach. Once a week on  Thursday  12 tablet  3  . amLODipine (NORVASC) 5 MG tablet Take 1 tablet (5 mg total) by mouth daily.  90 tablet  3  . diclofenac sodium (VOLTAREN) 1 % GEL Apply 2 g topically 2 (two) times daily.  1 Tube  2  . levothyroxine (SYNTHROID, LEVOTHROID) 75 MCG tablet TAKE 1 TABLET DAILY FOR THYROID  90 tablet  0  . omeprazole (PRILOSEC) 40 MG capsule Take 1 capsule (40 mg total) by mouth 2 (two) times daily.  60 capsule  0  . traMADol (ULTRAM) 50 MG tablet Take 1 tablet (50 mg total) by mouth every 8 (eight) hours as needed for pain.  30  tablet  1   No current facility-administered medications on file prior to visit.    Physical exam  BP 112/70  Pulse 72  Temp(Src) 98.1 F (36.7 C) (Oral)  Resp 14  Wt 121 lb 6.4 oz (55.067 kg)  SpO2 97%  Constitutional: She is oriented to person, place, and time. She appears well-developed and well-nourished. No distress.   HENT:   Head: Normocephalic and atraumatic.   Mouth/Throat: Oropharynx is clear and moist. No oropharyngeal exudate.   Eyes: Conjunctivae are normal. Pupils are equal, round, and reactive to light.   Neck: Normal range of motion.  Neck supple. No tracheal deviation present.   Cardiovascular: Normal rate and regular rhythm.    Pulmonary/Chest: Effort normal and breath sounds normal.   Abdominal: Soft. Bowel sounds are normal. No abdominal bruit Musculoskeletal: Normal range of motion. She exhibits no edema.  Lymphadenopathy:    She has no cervical adenopathy.  Neurological: She is alert and oriented to person, place, and time.  normal sensation to pinprick and vibration Skin: Skin is warm and dry. She is not diaphoretic. Has vitiligo Psychiatric: She has a normal mood and affect. Her behavior is normal.   Labs-  CMP     Component Value Date/Time   NA 139 11/16/2012 0825   K 4.0 11/16/2012 0825   CL 101 11/16/2012 0825   CO2 24 11/16/2012 0825   GLUCOSE 84 11/16/2012 0825   BUN 19 11/16/2012 0825   BUN 10 12/25/2006 1644   CREATININE 1.24* 11/16/2012 0825   CALCIUM 9.3 11/16/2012 0825   GFRNONAA 44* 11/16/2012 0825   GFRAA 51* 11/16/2012 0825    10/22/12-  Wbc 5.1, hb 12.1, hct 36.5, plt 211, glu 83, na 141, k 4.2, cl 104, co2 25, bun 35, cr 1.32, egfr 83, t.chol 186, tg 106, hdl 63, ldl 102, tsh 0.633, vit d 09.8  ASSESSMENT/PLAN  Tingling in extremities With normal neurological exam and her current symptoms, most likely has restless leg syndrome. No anemia in past. With symptom being there for few months only and occuring 1-2 times a week, patient wants to hold off on any treatment for now. Will check cbc with iron panel. Education about the medical problem and methods of treatment explained  Essential hypertension, benign Blood pressure well controlled. Continue amlodipine 5 mg daily for now. Check bmp today. Avoid nsaids, cut down on salt.   GERD (gastroesophageal reflux disease) Improved. Continue omeprazole 40 mg twice daily  Osteopenia Continue alendronate for now. Also educated about calcium and vitamin d, patient to take OTC   Osteoarthritis Improved with voltaren gel and exercise. Has not required  tramadol for several months. Will stop tramadol. Monitor clinically  Hypothyroidism Continue levothyroxine, check tsh today  CKD stage 3 long standing HTN contributing to this. Avoid  NSAIDs. Monitor renal function   Labs- tsh, cbc, ferritin, bmp

## 2013-03-04 ENCOUNTER — Other Ambulatory Visit: Payer: Medicare Other

## 2013-03-04 DIAGNOSIS — N179 Acute kidney failure, unspecified: Secondary | ICD-10-CM

## 2013-03-05 LAB — BASIC METABOLIC PANEL
BUN/Creatinine Ratio: 18 (ref 11–26)
BUN: 18 mg/dL (ref 8–27)
CO2: 24 mmol/L (ref 18–29)
Calcium: 10.1 mg/dL (ref 8.6–10.2)
Chloride: 101 mmol/L (ref 97–108)
Creatinine, Ser: 1.02 mg/dL — ABNORMAL HIGH (ref 0.57–1.00)
GFR calc Af Amer: 64 mL/min/{1.73_m2} (ref >59)
GFR calc non Af Amer: 55 mL/min/{1.73_m2} — ABNORMAL LOW (ref >59)
Glucose: 88 mg/dL (ref 65–99)
Potassium: 4.4 mmol/L (ref 3.5–5.2)
Sodium: 142 mmol/L (ref 134–144)

## 2013-03-06 ENCOUNTER — Encounter: Payer: Self-pay | Admitting: *Deleted

## 2013-04-02 ENCOUNTER — Other Ambulatory Visit: Payer: Self-pay | Admitting: Nurse Practitioner

## 2013-06-28 ENCOUNTER — Other Ambulatory Visit: Payer: Self-pay | Admitting: Internal Medicine

## 2013-07-01 ENCOUNTER — Other Ambulatory Visit: Payer: Self-pay

## 2013-07-01 DIAGNOSIS — Z1231 Encounter for screening mammogram for malignant neoplasm of breast: Secondary | ICD-10-CM

## 2013-07-03 ENCOUNTER — Encounter: Payer: Self-pay | Admitting: Internal Medicine

## 2013-07-03 ENCOUNTER — Ambulatory Visit (INDEPENDENT_AMBULATORY_CARE_PROVIDER_SITE_OTHER): Payer: Commercial Managed Care - HMO | Admitting: Internal Medicine

## 2013-07-03 VITALS — BP 118/70 | HR 69 | Temp 96.8°F | Ht 63.0 in | Wt 123.0 lb

## 2013-07-03 DIAGNOSIS — M1990 Unspecified osteoarthritis, unspecified site: Secondary | ICD-10-CM

## 2013-07-03 DIAGNOSIS — E039 Hypothyroidism, unspecified: Secondary | ICD-10-CM

## 2013-07-03 DIAGNOSIS — M8580 Other specified disorders of bone density and structure, unspecified site: Secondary | ICD-10-CM

## 2013-07-03 DIAGNOSIS — M949 Disorder of cartilage, unspecified: Secondary | ICD-10-CM

## 2013-07-03 DIAGNOSIS — K117 Disturbances of salivary secretion: Secondary | ICD-10-CM | POA: Insufficient documentation

## 2013-07-03 DIAGNOSIS — K219 Gastro-esophageal reflux disease without esophagitis: Secondary | ICD-10-CM

## 2013-07-03 DIAGNOSIS — I10 Essential (primary) hypertension: Secondary | ICD-10-CM

## 2013-07-03 DIAGNOSIS — N183 Chronic kidney disease, stage 3 unspecified: Secondary | ICD-10-CM

## 2013-07-03 DIAGNOSIS — M899 Disorder of bone, unspecified: Secondary | ICD-10-CM

## 2013-07-03 DIAGNOSIS — Z0000 Encounter for general adult medical examination without abnormal findings: Secondary | ICD-10-CM

## 2013-07-03 MED ORDER — GLYCOPYRROLATE 1 MG PO TABS: 1.0000 mg | ORAL_TABLET | Freq: Two times a day (BID) | ORAL | Status: DC

## 2013-07-03 MED ORDER — OMEPRAZOLE 20 MG PO CPDR: 20.0000 mg | DELAYED_RELEASE_CAPSULE | Freq: Every day | ORAL | Status: DC

## 2013-07-03 MED ORDER — OMEPRAZOLE 40 MG PO CPDR: 40.0000 mg | DELAYED_RELEASE_CAPSULE | Freq: Every day | ORAL | Status: DC

## 2013-07-03 NOTE — Progress Notes (Signed)
Patient ID: Tina Roberts, female   DOB: [birth year], 73 y.o.   MRN: [id number]    Chief Complaint  Patient presents with  . Annual Exam    annual exam  . Immunizations    declines shingles   No Known Allergies  HPI 73 y/o female patient is here for her annual exam. She has history of HTN and osteoporosis She feels like she has to clear her throat frequently, she was started on omeprazole 40 mg daily and she feels this has worsened with it. She feels the saliva is excessive and to collect up and her needing to spit it out frequently No difficulty swallowing, denies any pain with swallowing The sputum is clear and foamy Has not had her pap smear in few years. Is sexually active. Denies vaginal discharge Last mammogram was ormal. Next due on 07/31/13 Last colonoscopy in 2008, next due in 2018 Declines shingles vaccine Has not had tdap- has script for it  dexa scan 01/2012 with T score of -2.3 and on fosamax  Review of Systems  Constitutional: Negative for fever, chills, weight loss, malaise/fatigue and diaphoresis.  HENT: Negative for congestion, hearing loss and sore throat.   Eyes: Negative for blurred vision, double vision and discharge. last seen by eye doctor almost a year back Respiratory: Negative for shortness of breath and wheezing.   Cardiovascular: Negative for chest pain, palpitations, orthopnea and leg swelling.  Gastrointestinal: Negative for heartburn, nausea, vomiting, abdominal pain, diarrhea and constipation.  Genitourinary: Negative for dysuria, urgency, frequency and flank pain.  Musculoskeletal: Negative for back pain, falls, joint pain and myalgias.  Skin: Negative for itching and rash. has vitligo Neurological:  Negative for dizziness, tingling, focal weakness and headaches.  Psychiatric/Behavioral: Negative for depression and memory loss. The patient is not nervous/anxious.    Past Medical History  Diagnosis Date  . Hypertension   . Hyperlipidemia   .  Osteoporosis   . Kidney stone   . Hypothyroidism   . Hepatic lesion   . History of gallstones   . Vitiligo   . GERD (gastroesophageal reflux disease)   . Vitamin D deficiency   . Myalgia and myositis, unspecified   . Osteoporosis, unspecified   . Reflux esophagitis   . Other dysphagia   . Disorder of bone and cartilage, unspecified   . Lacrimal disorder   . Conjunctivitis unspecified   . Osteopenia   . Unspecified vitamin D deficiency   . Vitiligo   . Osteoarthrosis, unspecified whether generalized or localized, unspecified site   . Pain in joint, forearm   . Unspecified disorder of kidney and ureter   . Unspecified hypothyroidism   . Other and unspecified hyperlipidemia   . Unspecified essential hypertension    Past Surgical History  Procedure Laterality Date  . Tubal ligation  1973  . Leg surgery      right   Current Outpatient Prescriptions on File Prior to Visit  Medication Sig Dispense Refill  . alendronate (FOSAMAX) 70 MG tablet TAKE 1 TABLET WITH A FULL GLASS OF WATER ON AN EMPTY STOMACH ONCE A WEEK ON THURSDAY  12 tablet  3  . amLODipine (NORVASC) 5 MG tablet Take 1 tablet (5 mg total) by mouth daily.  90 tablet  3  . diclofenac sodium (VOLTAREN) 1 % GEL Apply 2 g topically 2 (two) times daily.  1 Tube  2  . levothyroxine (SYNTHROID, LEVOTHROID) 75 MCG tablet TAKE 1 TABLET DAILY FOR THYROID  90 tablet  1  .  diptheria-tetanus toxoids (DECAVAC) 2-2 LF/0.5ML injection Inject 0.5 mLs into the muscle once.  0.5 mL  0   No current facility-administered medications on file prior to visit.    Immunization History  Administered Date(s) Administered  . Influenza Whole 12/03/2012  . Pneumococcal Polysaccharide-23 11/20/2012   Family History  Problem Relation Age of Onset  . Colon cancer Mother 19  . Diabetes Mother   . Diabetes Sister    History   Social History  . Marital Status: Single    Spouse Name: N/A    Number of Children: 4  . Years of Education: N/A    Occupational History  . Not on file.   Social History Main Topics  . Smoking status: Never Smoker   . Smokeless tobacco: Never Used  . Alcohol Use: Yes     Comment: occas  . Drug Use: No  . Sexual Activity: Not on file   Other Topics Concern  . Not on file   Social History Narrative  . No narrative on file    Physical exam BP 118/70  Pulse 69  Temp(Src) 96.8 F (36 C) (Oral)  Ht 5\' 3"  (1.6 m)  Wt 123 lb (55.792 kg)  BMI 21.79 kg/m2  SpO2 99%  General- elderly female in no acute distress Head- atraumatic, normocephalic Eyes- PERRLA, EOMI, no pallor, no icterus, no discharge Ears- left ear normal tympanic membrane and normal external ear canal , right ear normal tympanic membrane and normal external ear canal Neck- no lymphadenopathy, no thyromegaly, no jugular vein distension, no carotid bruit Nose- normal nasaal mucosa, no maxillary sinus tenderness, no frontal sinus tenderness Mouth- normal mucus membrane, no oral thrush, normal oropharynx Chest- no chest wall deformities, no chest wall tenderness Breast- no masses, no palpable lumps, normal nipple and areola exam, no axillary lymphadenopathy Cardiovascular- normal s1,s2, no murmurs/ rubs/ gallops, normal distal pulses Respiratory- bilateral clear to auscultation, no wheeze, no rhonchi, no crackles Abdomen- bowel sounds present, soft, non tender, no organomegaly, no abdominal bruits, no guarding or rigidity, no CVA tenderness Pelvic exam- normal pelvic exam, pap smear done Musculoskeletal- able to move all 4 extremities, no spinal and paraspinal tenderness, steady gait, no use of assistive device, normal range of motion, no leg edema Neurological- no focal deficit, normal reflexes, normal muscle strength, normal sensation to fine touch and vibration Skin- warm and dry, vitligo Psychiatry- alert and oriented to person, place and time, normal mood and affect  Labs-  CMP     Component Value Date/Time   NA 142  03/04/2013 0851   K 4.4 03/04/2013 0851   CL 101 03/04/2013 0851   CO2 24 03/04/2013 0851   GLUCOSE 88 03/04/2013 0851   BUN 18 03/04/2013 0851   BUN 10 12/25/2006 1644   CREATININE 1.02* 03/04/2013 0851   CALCIUM 10.1 03/04/2013 0851   GFRNONAA 55* 03/04/2013 0851   GFRAA 64 03/04/2013 0851    ekg- normal sinus rhythm, left axis deviation  Assessment/plan  1. Essential hypertension, benign Stable bp reading, continue norvasc 5 mg daily for now - CBC with Differential - CMP - Lipid Panel  2. GERD (gastroesophageal reflux disease) Stable, decrease prilosec to 20 mg daily  3. Hypothyroidism Check thyroid panel. continuelevothyroxine 75 mcg until review of results - TSH  4. Osteopenia Continue fosamax, consider changing fosamax to another agent for osteoporosis if BMD continues to decline while on fosamax - Vitamin D, 1,25-dihydroxy  5. Osteoarthritis Stable. Continue voltaren gel for now  6. CKD (chronic  kidney disease) stage 3, GFR 30-59 ml/min Monitor renal function. Avoid nsaid  7. Excessive salivation Will try her on glycopyrrolate 1 mg bid for now and reassess if no improvement  8. Routine general medical examination at a health care facility Pap smear done, has upcoming mammogram. uptodate with dexa and colonoscopy - PAP, IG w/ Reflex HPV when ASCU [[facility], [facility]]  Preventive counselling on skin protection, safe driving, dietary recommendation and exercise recommended. Will also get her blood work done

## 2013-07-04 LAB — PAP IG W/ RFLX HPV ASCU: PAP Smear Comment: 0

## 2013-07-04 LAB — TSH: TSH: 0.51 u[IU]/mL (ref 0.450–4.500)

## 2013-07-04 LAB — CBC WITH DIFFERENTIAL/PLATELET
Basophils Absolute: 0 10*3/uL (ref 0.0–0.2)
Basos: 1 %
Eos: 3 %
Eosinophils Absolute: 0.1 10*3/uL (ref 0.0–0.4)
HCT: 38.2 % (ref 34.0–46.6)
Hemoglobin: 11.9 g/dL (ref 11.1–15.9)
Immature Grans (Abs): 0 10*3/uL (ref 0.0–0.1)
Immature Granulocytes: 0 %
Lymphocytes Absolute: 1.6 10*3/uL (ref 0.7–3.1)
Lymphs: 37 %
MCH: 27.5 pg (ref 26.6–33.0)
MCHC: 31.2 g/dL — ABNORMAL LOW (ref 31.5–35.7)
MCV: 88 fL (ref 79–97)
Monocytes Absolute: 0.4 10*3/uL (ref 0.1–0.9)
Monocytes: 9 %
Neutrophils Absolute: 2.3 10*3/uL (ref 1.4–7.0)
Neutrophils Relative %: 50 %
RBC: 4.33 x10E6/uL (ref 3.77–5.28)
RDW: 13.8 % (ref 12.3–15.4)
WBC: 4.4 10*3/uL (ref 3.4–10.8)

## 2013-07-04 LAB — COMPREHENSIVE METABOLIC PANEL
ALT: 16 IU/L (ref 0–32)
AST: 22 IU/L (ref 0–40)
Albumin/Globulin Ratio: 1.3 (ref 1.1–2.5)
Albumin: 4 g/dL (ref 3.5–4.8)
Alkaline Phosphatase: 43 IU/L (ref 39–117)
BUN/Creatinine Ratio: 21 (ref 11–26)
BUN: 18 mg/dL (ref 8–27)
CO2: 22 mmol/L (ref 18–29)
Calcium: 9.1 mg/dL (ref 8.7–10.3)
Chloride: 103 mmol/L (ref 97–108)
Creatinine, Ser: 0.85 mg/dL (ref 0.57–1.00)
GFR calc Af Amer: 80 mL/min/{1.73_m2} (ref >59)
GFR calc non Af Amer: 69 mL/min/{1.73_m2} (ref >59)
Globulin, Total: 3 g/dL (ref 1.5–4.5)
Glucose: 93 mg/dL (ref 65–99)
Potassium: 3.9 mmol/L (ref 3.5–5.2)
Sodium: 143 mmol/L (ref 134–144)
Total Bilirubin: 0.2 mg/dL (ref 0.0–1.2)
Total Protein: 7 g/dL (ref 6.0–8.5)

## 2013-07-04 LAB — VITAMIN D 1,25 DIHYDROXY: Vit D, 1,25-Dihydroxy: 106 pg/mL — ABNORMAL HIGH (ref 19.9–79.3)

## 2013-07-04 LAB — LIPID PANEL
Chol/HDL Ratio: 3.7 ratio units (ref 0.0–4.4)
Cholesterol, Total: 206 mg/dL — ABNORMAL HIGH (ref 100–199)
HDL: 55 mg/dL (ref >39)
LDL Calculated: 136 mg/dL — ABNORMAL HIGH (ref 0–99)
Triglycerides: 75 mg/dL (ref 0–149)
VLDL Cholesterol Cal: 15 mg/dL (ref 5–40)

## 2013-07-16 ENCOUNTER — Encounter: Payer: Self-pay | Admitting: Internal Medicine

## 2013-07-31 ENCOUNTER — Ambulatory Visit
Admission: RE | Admit: 2013-07-31 | Discharge: 2013-07-31 | Disposition: A | Discharge status: Home / Self Care | Payer: Commercial Managed Care - HMO | Source: Ambulatory Visit

## 2013-07-31 DIAGNOSIS — Z1231 Encounter for screening mammogram for malignant neoplasm of breast: Secondary | ICD-10-CM

## 2013-08-02 ENCOUNTER — Encounter: Payer: Self-pay | Admitting: *Deleted

## 2013-08-05 ENCOUNTER — Other Ambulatory Visit: Payer: Self-pay | Admitting: Internal Medicine

## 2013-09-29 ENCOUNTER — Other Ambulatory Visit: Payer: Self-pay | Admitting: Internal Medicine

## 2013-10-02 ENCOUNTER — Encounter: Payer: Self-pay | Admitting: Internal Medicine

## 2013-10-02 ENCOUNTER — Ambulatory Visit (INDEPENDENT_AMBULATORY_CARE_PROVIDER_SITE_OTHER): Payer: Commercial Managed Care - HMO | Admitting: Internal Medicine

## 2013-10-02 VITALS — BP 118/70 | HR 70 | Temp 98.1°F | Wt 118.0 lb

## 2013-10-02 DIAGNOSIS — K219 Gastro-esophageal reflux disease without esophagitis: Secondary | ICD-10-CM

## 2013-10-02 DIAGNOSIS — E038 Other specified hypothyroidism: Secondary | ICD-10-CM

## 2013-10-02 DIAGNOSIS — M8580 Other specified disorders of bone density and structure, unspecified site: Secondary | ICD-10-CM

## 2013-10-02 DIAGNOSIS — I10 Essential (primary) hypertension: Secondary | ICD-10-CM

## 2013-10-02 DIAGNOSIS — M899 Disorder of bone, unspecified: Secondary | ICD-10-CM

## 2013-10-02 DIAGNOSIS — K117 Disturbances of salivary secretion: Secondary | ICD-10-CM

## 2013-10-02 DIAGNOSIS — R1319 Other dysphagia: Secondary | ICD-10-CM

## 2013-10-02 DIAGNOSIS — M949 Disorder of cartilage, unspecified: Secondary | ICD-10-CM

## 2013-10-02 MED ORDER — PANTOPRAZOLE SODIUM 40 MG PO TBEC: 40.0000 mg | DELAYED_RELEASE_TABLET | Freq: Every day | ORAL | Status: DC

## 2013-10-02 NOTE — Patient Instructions (Signed)
stop robinul  Stop omeprazole  Start taking pantoprazole once a day

## 2013-10-02 NOTE — Progress Notes (Signed)
Patient ID: Tina Roberts, female   DOB: [birth year], 73 y.o.   MRN: [id number]    Chief Complaint  Patient presents with  . Medical Management of Chronic Issues    3 month follow-up  . Medication Management    Robinul not working, prilosec not working    HPI 73 y/o female is here for routine visit. She continues to have increased salivation and now feels like solid food is there in her throat for sometime before it passes down. Has been coughing with meals. Her omeprazole is not helping her either.  Has been compliant with her medications. Has been exercising. appetite is fair. Has lost some weight as well. No other concerns  Review of Systems  Constitutional: Negative for fever, chills, malaise/fatigue and diaphoresis.  HENT: Negative for congestion, hearing loss and sore throat.   Eyes: Negative for blurred vision, double vision and discharge.  Respiratory: Negative for cshortness of breath and wheezing.  wears glasses. Excess salivation Cardiovascular: Negative for chest pain, palpitations, orthopnea and leg swelling.  Gastrointestinal: Negative for nausea, vomiting, abdominal pain, diarrhea and constipation. Heartburn worsened Genitourinary: Negative for dysuria, urgency, frequency and flank pain.  Musculoskeletal: Negative for back pain, falls, joint pain and myalgias.  Skin: Negative for itching and rash. has vitligo Neurological:  Negative for dizziness, focal weakness and headaches.  Psychiatric/Behavioral: Negative for depression and memory loss. The patient is not nervous/anxious.    Past Medical History  Diagnosis Date  . Hypertension   . Hyperlipidemia   . Osteoporosis   . Kidney stone   . Hypothyroidism   . Hepatic lesion   . History of gallstones   . Vitiligo   . GERD (gastroesophageal reflux disease)   . Vitamin D deficiency   . Myalgia and myositis, unspecified   . Osteoporosis, unspecified   . Reflux esophagitis   . Other dysphagia   . Disorder of bone and  cartilage, unspecified   . Lacrimal disorder   . Conjunctivitis unspecified   . Osteopenia   . Unspecified vitamin D deficiency   . Vitiligo   . Osteoarthrosis, unspecified whether generalized or localized, unspecified site   . Pain in joint, forearm   . Unspecified disorder of kidney and ureter   . Unspecified hypothyroidism   . Other and unspecified hyperlipidemia   . Unspecified essential hypertension    Current Outpatient Prescriptions on File Prior to Visit  Medication Sig Dispense Refill  . alendronate (FOSAMAX) 70 MG tablet TAKE 1 TABLET WITH A FULL GLASS OF WATER ON AN EMPTY STOMACH ONCE A WEEK ON THURSDAY  12 tablet  3  . amLODipine (NORVASC) 5 MG tablet Take 1 tablet (5 mg total) by mouth daily.  90 tablet  3  . diclofenac sodium (VOLTAREN) 1 % GEL Apply 2 g topically 2 (two) times daily.  1 Tube  2  . levothyroxine (SYNTHROID, LEVOTHROID) 75 MCG tablet TAKE 1 TABLET DAILY FOR THYROID  90 tablet  1  . diptheria-tetanus toxoids (DECAVAC) 2-2 LF/0.5ML injection Inject 0.5 mLs into the muscle once.  0.5 mL  0   No current facility-administered medications on file prior to visit.   Physical exam BP 118/70  Pulse 70  Temp(Src) 98.1 F (36.7 C) (Oral)  Wt 118 lb (53.524 kg)  SpO2 98%  General- elderly female in no acute distress Head- atraumatic, normocephalic Neck- no lymphadenopathy, no thyromegaly, no jugular vein distension, no carotid bruit Nose- normal nasal mucosa, no maxillary sinus tenderness, no frontal sinus tenderness Mouth- normal  mucus membrane, no oral thrush, normal oropharynx Chest- no chest wall deformities, no chest wall tenderness Cardiovascular- normal s1,s2, no murmurs/ rubs/ gallops, normal distal pulses Respiratory- bilateral clear to auscultation, no wheeze, no rhonchi, no crackles Abdomen- bowel sounds present, soft, non tender, no organomegaly,no guarding or rigidity, no CVA tenderness Musculoskeletal- able to move all 4 extremities, no leg  edema Neurological- no focal deficit Skin- warm and dry, vitligo Psychiatry- alert and oriented to person, place and time, normal mood and affect  Labs- Lab Results  Component Value Date   TSH 0.510 07/03/2013   Lab Results  Component Value Date   CREATININE 0.85 07/03/2013   Assessment/plan  1. Other dysphagia Will get barium swallow to assess for pharyngo-esophageal dysphagia. After reviewing this, might get EGD for further assessment if needed. wll have her omeprazole changed to pantoprazole 40 mg daily for now - DG Esophagus; Future  2. Essential hypertension, benign Stable. Continue amlodipine 5 mg daily  3. Excessive salivation Robinul not helping, made her mouth dry but no change with her secretions. Discontinue this. Follow on barium swallow result first  4. Gastroesophageal reflux disease, esophagitis presence not specified Her alendronate could be worsening this as well. If no improvement, consider changing her alendronate to injection bisphosphonate or RANK-ligand - pantoprazole 40 mg daily - DG Esophagus; Future  5. Other specified hypothyroidism Levothyroxine 75 mcg daily  6. Osteopenia Continue her alendronate for now

## 2013-10-14 ENCOUNTER — Other Ambulatory Visit: Payer: Self-pay | Admitting: Internal Medicine

## 2013-10-23 ENCOUNTER — Encounter: Payer: Self-pay | Admitting: Internal Medicine

## 2013-10-23 ENCOUNTER — Ambulatory Visit (INDEPENDENT_AMBULATORY_CARE_PROVIDER_SITE_OTHER): Payer: Commercial Managed Care - HMO | Admitting: Internal Medicine

## 2013-10-23 VITALS — BP 116/70 | HR 76 | Temp 98.0°F | Wt 119.8 lb

## 2013-10-23 DIAGNOSIS — K2100 Gastro-esophageal reflux disease with esophagitis, without bleeding: Secondary | ICD-10-CM

## 2013-10-23 DIAGNOSIS — K449 Diaphragmatic hernia without obstruction or gangrene: Secondary | ICD-10-CM | POA: Insufficient documentation

## 2013-10-23 DIAGNOSIS — K117 Disturbances of salivary secretion: Secondary | ICD-10-CM

## 2013-10-23 DIAGNOSIS — K920 Hematemesis: Secondary | ICD-10-CM

## 2013-10-23 MED ORDER — PANTOPRAZOLE SODIUM 40 MG PO TBEC
40.0000 mg | DELAYED_RELEASE_TABLET | Freq: Two times a day (BID) | ORAL | Status: DC
Start: 2013-10-23 — End: 2014-03-02

## 2013-10-23 NOTE — Progress Notes (Signed)
Patient ID: Tina Roberts, female   DOB: 18-Apr-1940, 73 y.o.   MRN: [id number]    Chief Complaint  Patient presents with  . Follow-up    3 week f/u on increased salivation   HPI 73 y/o female pt here for f/u on her spitting up, clearing of throat and now new spitting put blood. She tried to constantly clear her throat and then vomited some blood which cleared after that. Has some discomfort on upper part of her chest at times and feels need for deep clearing of her throat in morning after getting up and at night after meals. Denies chest pain Denies epigastric pain No nausea or vomiting other than one episode mentioned above  ROS No rectal bleed Denies melena No abdominal pain No urinary complaints No racing of the heart  Past Medical History  Diagnosis Date  . Hypertension   . Hyperlipidemia   . Osteoporosis   . Kidney stone   . Hypothyroidism   . Hepatic lesion   . History of gallstones   . Vitiligo   . GERD (gastroesophageal reflux disease)   . Vitamin D deficiency   . Myalgia and myositis, unspecified   . Osteoporosis, unspecified   . Reflux esophagitis   . Other dysphagia   . Disorder of bone and cartilage, unspecified   . Lacrimal disorder   . Conjunctivitis unspecified   . Osteopenia   . Unspecified vitamin D deficiency   . Vitiligo   . Osteoarthrosis, unspecified whether generalized or localized, unspecified site   . Pain in joint, forearm   . Unspecified disorder of kidney and ureter   . Unspecified hypothyroidism   . Other and unspecified hyperlipidemia   . Unspecified essential hypertension    Current Outpatient Prescriptions on File Prior to Visit  Medication Sig Dispense Refill  . alendronate (FOSAMAX) 70 MG tablet TAKE 1 TABLET WITH A FULL GLASS OF WATER ON AN EMPTY STOMACH ONCE A WEEK ON THURSDAY  12 tablet  3  . amLODipine (NORVASC) 5 MG tablet TAKE 1 TABLET (5 MG TOTAL) BY MOUTH DAILY.  90 tablet  1  . diclofenac sodium (VOLTAREN) 1 % GEL Apply 2  g topically 2 (two) times daily.  1 Tube  2  . diptheria-tetanus toxoids (DECAVAC) 2-2 LF/0.5ML injection Inject 0.5 mLs into the muscle once.  0.5 mL  0  . levothyroxine (SYNTHROID, LEVOTHROID) 75 MCG tablet TAKE 1 TABLET DAILY FOR THYROID  90 tablet  1   No current facility-administered medications on file prior to visit.    Physical exam BP 116/70  Pulse 76  Temp(Src) 98 F (36.7 C) (Oral)  Wt 119 lb 12.8 oz (54.341 kg)  SpO2 97%  General- elderly female in no acute distress Head- atraumatic, normocephalic Neck- no lymphadenopathy, no thyromegaly, no jugular vein distension, no carotid bruit Mouth- normal mucus membrane, no oral thrush, normal oropharynx Cardiovascular- normal s1,s2, no murmurs Respiratory- bilateral clear to auscultation, no wheeze, no rhonchi, no crackles Abdomen- bowel sounds present, soft, non tender Musculoskeletal- able to move all 4 extremities, no leg edema Neurological- no focal deficit Skin- warm and dry, vitligo Psychiatry- alert and oriented to person, place and time, normal mood and affect  Assessment/plan 1. Excessive salivation i feel this is in setting of her worsening reflux disease. Her ENT workup has been negative. See below  2. Gastroesophageal reflux disease with esophagitis Will increase protonix to 40 mg bid reviewed swallow study from 2013 with sliding hernia, esophageal irregularity in distal esophagus  with concern of esophagitis. Will refer to GI for EGD and further workup/ evaluation at this time - Ambulatory referral to Gastroenterology - CBC with Differential - Basic Metabolic Panel  3. Hiatal hernia protonix bid for now until seen by GI further. - Ambulatory referral to Gastroenterology  4. Hematemesis without nausea 1 episode. Check cbc and bmp to rule out ongoing bleed. Concern for esophageal ulcer vs gastritis and or worsening hernia. Will refer to gi for further input - Ambulatory referral to Gastroenterology - CBC with  Differential - Basic Metabolic Panel

## 2013-10-24 LAB — CBC WITH DIFFERENTIAL/PLATELET
Basophils Absolute: 0 10*3/uL (ref 0.0–0.2)
Basos: 0 %
Eos: 3 %
Eosinophils Absolute: 0.2 10*3/uL (ref 0.0–0.4)
HCT: 38.8 % (ref 34.0–46.6)
Hemoglobin: 12.8 g/dL (ref 11.1–15.9)
Immature Grans (Abs): 0 10*3/uL (ref 0.0–0.1)
Immature Granulocytes: 0 %
Lymphocytes Absolute: 2.2 10*3/uL (ref 0.7–3.1)
Lymphs: 40 %
MCH: 27.4 pg (ref 26.6–33.0)
MCHC: 33 g/dL (ref 31.5–35.7)
MCV: 83 fL (ref 79–97)
Monocytes Absolute: 0.5 10*3/uL (ref 0.1–0.9)
Monocytes: 9 %
Neutrophils Absolute: 2.5 10*3/uL (ref 1.4–7.0)
Neutrophils Relative %: 48 %
RBC: 4.68 x10E6/uL (ref 3.77–5.28)
RDW: 14 % (ref 12.3–15.4)
WBC: 5.4 10*3/uL (ref 3.4–10.8)

## 2013-10-24 LAB — BASIC METABOLIC PANEL
BUN/Creatinine Ratio: 14 (ref 11–26)
BUN: 14 mg/dL (ref 8–27)
CO2: 25 mmol/L (ref 18–29)
Calcium: 9.6 mg/dL (ref 8.7–10.3)
Chloride: 104 mmol/L (ref 97–108)
Creatinine, Ser: 0.98 mg/dL (ref 0.57–1.00)
GFR calc Af Amer: 67 mL/min/{1.73_m2} (ref >59)
GFR calc non Af Amer: 58 mL/min/{1.73_m2} — ABNORMAL LOW (ref >59)
Glucose: 104 mg/dL — ABNORMAL HIGH (ref 65–99)
Potassium: 3.7 mmol/L (ref 3.5–5.2)
Sodium: 143 mmol/L (ref 134–144)

## 2013-10-28 ENCOUNTER — Encounter: Payer: Self-pay | Admitting: *Deleted

## 2013-10-28 NOTE — Progress Notes (Signed)
Sent letter of normal results. SR RMA

## 2013-11-06 ENCOUNTER — Encounter: Payer: Self-pay | Admitting: Gastroenterology

## 2013-11-06 ENCOUNTER — Ambulatory Visit (INDEPENDENT_AMBULATORY_CARE_PROVIDER_SITE_OTHER): Payer: Commercial Managed Care - HMO | Admitting: Gastroenterology

## 2013-11-06 VITALS — BP 140/74 | HR 68 | Ht 62.0 in | Wt 122.0 lb

## 2013-11-06 DIAGNOSIS — R070 Pain in throat: Secondary | ICD-10-CM

## 2013-11-06 DIAGNOSIS — R0989 Other specified symptoms and signs involving the circulatory and respiratory systems: Secondary | ICD-10-CM

## 2013-11-06 DIAGNOSIS — R198 Other specified symptoms and signs involving the digestive system and abdomen: Secondary | ICD-10-CM

## 2013-11-06 DIAGNOSIS — F458 Other somatoform disorders: Secondary | ICD-10-CM

## 2013-11-06 NOTE — Patient Instructions (Signed)
You have been scheduled for an endoscopy. Please follow written instructions given to you at your visit today. If you use inhalers (even only as needed), please bring them with you on the day of your procedure. Your physician has requested that you go to www.startemmi.com and enter the access code given to you at your visit today. This web site gives a general overview about your procedure. However, you should still follow specific instructions given to you by our office regarding your preparation for the procedure.  Follow up with Dr. Constance Holster at [facility].  Thank you for choosing me and [facility].  Pricilla Riffle. Dagoberto Ligas., MD., [facility]

## 2013-11-06 NOTE — Progress Notes (Signed)
    History of Present Illness: This is a 73 year old female with globus sensation, excessive salivation, throat pain, sinus drainage, throat clearing with phelm. She denies heartburn, belching, chest pain, abdominal pain, regurgitation. She had 1 episode of throat clearing with coughing and then vomiting with hematemesis. Barium esophagram in November 2013 showed a small sliding hiatal hernia episodes of GERD. There were mild mucosal irregularities in the distal esophagus that may reflect changes of esophagitis. She has never had improvement in her symptoms with PPI therapy. She is not previously had upper endoscopy.  Current Medications, Allergies, Past Medical History, Past Surgical History, Family History and Social History were reviewed in [employer] record.  Physical Exam: General: Well developed , well nourished, no acute distress Head: Normocephalic and atraumatic Eyes:  sclerae anicteric, EOMI Ears: Normal auditory acuity Mouth: No deformity or lesions Lungs: Clear throughout to auscultation Heart: Regular rate and rhythm; no murmurs, rubs or bruits Abdomen: Soft, non tender and non distended. No masses, hepatosplenomegaly or hernias noted. Normal Bowel sounds Musculoskeletal: Symmetrical with no gross deformities  Pulses:  Normal pulses noted Extremities: No clubbing, cyanosis, edema or deformities noted Neurological: Alert oriented x 4, grossly nonfocal Psychological:  Alert and cooperative. Normal mood and affect  Assessment and Recommendations:  1. She has mild GERD per BA esophagram however no typical symptoms of GERD and her ENT symptoms have never responded to PPI therapy. I feel that she has an ENT disorder. Needs further ENT evaluation. Continue pantoprazole 40 mg twice daily for at least the next 3 months with all standard antireflux measures to assess  impact on her symptoms and schedule upper endoscopy to further evaluate. The risks, benefits, and  alternatives to endoscopy with possible biopsy and possible dilation were discussed with the patient and they consent to proceed.

## 2013-11-26 ENCOUNTER — Encounter: Payer: Self-pay | Admitting: Gastroenterology

## 2013-12-10 ENCOUNTER — Encounter: Payer: Commercial Managed Care - HMO | Admitting: Gastroenterology

## 2013-12-10 ENCOUNTER — Ambulatory Visit (AMBULATORY_SURGERY_CENTER): Payer: Self-pay | Admitting: *Deleted

## 2013-12-10 VITALS — Ht 62.0 in | Wt 124.0 lb

## 2013-12-10 DIAGNOSIS — R0989 Other specified symptoms and signs involving the circulatory and respiratory systems: Secondary | ICD-10-CM

## 2013-12-10 DIAGNOSIS — F458 Other somatoform disorders: Secondary | ICD-10-CM

## 2013-12-10 DIAGNOSIS — R198 Other specified symptoms and signs involving the digestive system and abdomen: Secondary | ICD-10-CM

## 2013-12-10 NOTE — Progress Notes (Signed)
Denies allergies to eggs or soy products. Denies complications with sedation or anesthesia. Denies O2 use. Denies use of diet or weight loss medications.    

## 2013-12-13 ENCOUNTER — Ambulatory Visit (AMBULATORY_SURGERY_CENTER): Payer: Commercial Managed Care - HMO | Admitting: Gastroenterology

## 2013-12-13 ENCOUNTER — Encounter: Payer: Self-pay | Admitting: Gastroenterology

## 2013-12-13 VITALS — BP 144/74 | HR 61 | Temp 98.7°F | Resp 14 | Ht 62.0 in | Wt 124.0 lb

## 2013-12-13 DIAGNOSIS — R0989 Other specified symptoms and signs involving the circulatory and respiratory systems: Secondary | ICD-10-CM

## 2013-12-13 DIAGNOSIS — R198 Other specified symptoms and signs involving the digestive system and abdomen: Secondary | ICD-10-CM

## 2013-12-13 DIAGNOSIS — K219 Gastro-esophageal reflux disease without esophagitis: Secondary | ICD-10-CM

## 2013-12-13 DIAGNOSIS — F458 Other somatoform disorders: Secondary | ICD-10-CM

## 2013-12-13 MED ORDER — SODIUM CHLORIDE 0.9 % IV SOLN: 500.0000 mL | INTRAVENOUS | Status: DC

## 2013-12-13 NOTE — Progress Notes (Signed)
A/ox3, pleased with MAC, report to RN 

## 2013-12-13 NOTE — Patient Instructions (Signed)
YOU HAD AN ENDOSCOPIC PROCEDURE TODAY AT THE [facility]: Refer to the procedure report that was given to you for any specific questions about what was found during the examination.  If the procedure report does not answer your questions, please call your gastroenterologist to clarify.  If you requested that your care partner not be given the details of your procedure findings, then the procedure report has been included in a sealed envelope for you to review at your convenience later.  YOU SHOULD EXPECT: Some feelings of bloating in the abdomen. Passage of more gas than usual.  Walking can help get rid of the air that was put into your GI tract during the procedure and reduce the bloating. If you had a lower endoscopy (such as a colonoscopy or flexible sigmoidoscopy) you may notice spotting of blood in your stool or on the toilet paper. If you underwent a bowel prep for your procedure, then you may not have a normal bowel movement for a few days.  DIET: Your first meal following the procedure should be a light meal and then it is ok to progress to your normal diet.  A half-sandwich or bowl of soup is an example of a good first meal.  Heavy or fried foods are harder to digest and may make you feel nauseous or bloated.  Likewise meals heavy in dairy and vegetables can cause extra gas to form and this can also increase the bloating.  Drink plenty of fluids but you should avoid alcoholic beverages for 24 hours.  ACTIVITY: Your care partner should take you home directly after the procedure.  You should plan to take it easy, moving slowly for the rest of the day.  You can resume normal activity the day after the procedure however you should NOT DRIVE or use heavy machinery for 24 hours (because of the sedation medicines used during the test).    SYMPTOMS TO REPORT IMMEDIATELY: A gastroenterologist can be reached at any hour.  During normal business hours, 8:30 AM to 5:00 PM Monday through Friday,  call [phone].  After hours and on weekends, please call the GI answering service at [phone] who will take a message and have the physician on call contact you.  Following upper endoscopy (EGD)  Vomiting of blood or coffee ground material  New chest pain or pain under the shoulder blades  Painful or persistently difficult swallowing  New shortness of breath  Fever of 100F or higher  Black, tarry-looking stools  FOLLOW UP: If any biopsies were taken you will be contacted by phone or by letter within the next 1-3 weeks.  Call your gastroenterologist if you have not heard about the biopsies in 3 weeks.  Our staff will call the home number listed on your records the next business day following your procedure to check on you and address any questions or concerns that you may have at that time regarding the information given to you following your procedure. This is a courtesy call and so if there is no answer at the home number and we have not heard from you through the emergency physician on call, we will assume that you have returned to your regular daily activities without incident.  SIGNATURES/CONFIDENTIALITY: You and/or your care partner have signed paperwork which will be entered into your electronic medical record.  These signatures attest to the fact that that the information above on your After Visit Summary has been reviewed and is understood.  Full responsibility of   the confidentiality of this discharge information lies with you and/or your care-partner. 

## 2013-12-13 NOTE — Op Note (Signed)
[facility]  [address] [place], [id number]   ENDOSCOPY PROCEDURE REPORT  PATIENT: Tina, Roberts  MR#: [id number] BIRTHDATE: [birth year] , 73  yrs. old GENDER: Female ENDOSCOPIST: Ladene Artist, MD, [facility] REFERRED BY:  Blanchie Serve, MD PROCEDURE DATE:  12/13/2013 PROCEDURE:  EGD, diagnostic ASA CLASS:     Class II INDICATIONS:  Globus senstation, GERD.Marland Kitchen MEDICATIONS: MAC sedation, administered by CRNA and propofol (Diprivan) 150mg  IV TOPICAL ANESTHETIC: none DESCRIPTION OF PROCEDURE: After the risks benefits and alternatives of the procedure were thoroughly explained, informed consent was obtained.  The LB [id number] endoscope was introduced through the mouth and advanced to the second portion of the duodenum. Without limitations.  The instrument was slowly withdrawn as the mucosa was fully examined.  ESOPHAGUS: The mucosa of the esophagus appeared normal. STOMACH: The mucosa and folds of the stomach appeared normal. DUODENUM: The duodenal mucosa showed no abnormalities in the bulb and second portion of the duodenum.  Retroflexed views revealed a small, sliding hiatal hernia.  The scope was then withdrawn from the patient and the procedure completed.  COMPLICATIONS: There were no complications.  ENDOSCOPIC IMPRESSION: 1.   Small hiatal hernia 2.   The EGD otherwise appeared normal  RECOMMENDATIONS: 1.  Anti-reflux regimen long term 2.  Continue PPI bid 3.  Follow up with Dr. Bubba Camp. Consider ENT referral. Symptoms have not improved with PPI bid. Although she has GERD it is not the cause of all her throat, neck and ENT symptoms.  eSigned:  Ladene Artist, MD, [facility] 12/13/2013 11:26 AM

## 2013-12-16 ENCOUNTER — Telehealth: Payer: Self-pay | Admitting: *Deleted

## 2013-12-16 NOTE — Telephone Encounter (Signed)
  Follow up Call-  Call back number 12/13/2013  Post procedure Call Back phone  # [phone]  Permission to leave phone message No  comments NO VOICEMAIL     Patient questions:  Do you have a fever, pain , or abdominal swelling? No. Pain Score  0 *  Have you tolerated food without any problems? Yes.    Have you been able to return to your normal activities? Yes.    Do you have any questions about your discharge instructions: Diet   No. Medications  No. Follow up visit  No.  Do you have questions or concerns about your Care? No.  Actions: * If pain score is 4 or above: No action needed, pain <4.

## 2014-01-22 ENCOUNTER — Ambulatory Visit: Payer: Commercial Managed Care - HMO | Admitting: Internal Medicine

## 2014-01-29 ENCOUNTER — Encounter: Payer: Self-pay | Admitting: Internal Medicine

## 2014-01-29 ENCOUNTER — Ambulatory Visit (INDEPENDENT_AMBULATORY_CARE_PROVIDER_SITE_OTHER): Payer: Commercial Managed Care - HMO | Admitting: Internal Medicine

## 2014-01-29 VITALS — BP 140/87 | HR 64 | Temp 97.7°F | Ht 62.0 in | Wt 119.0 lb

## 2014-01-29 DIAGNOSIS — I10 Essential (primary) hypertension: Secondary | ICD-10-CM

## 2014-01-29 DIAGNOSIS — R198 Other specified symptoms and signs involving the digestive system and abdomen: Secondary | ICD-10-CM | POA: Insufficient documentation

## 2014-01-29 DIAGNOSIS — K219 Gastro-esophageal reflux disease without esophagitis: Secondary | ICD-10-CM

## 2014-01-29 DIAGNOSIS — R09A2 Foreign body sensation, throat: Secondary | ICD-10-CM | POA: Insufficient documentation

## 2014-01-29 DIAGNOSIS — Z23 Encounter for immunization: Secondary | ICD-10-CM

## 2014-01-29 DIAGNOSIS — R0989 Other specified symptoms and signs involving the circulatory and respiratory systems: Secondary | ICD-10-CM | POA: Insufficient documentation

## 2014-01-29 DIAGNOSIS — F458 Other somatoform disorders: Secondary | ICD-10-CM

## 2014-01-29 DIAGNOSIS — E038 Other specified hypothyroidism: Secondary | ICD-10-CM

## 2014-01-29 DIAGNOSIS — N183 Chronic kidney disease, stage 3 unspecified: Secondary | ICD-10-CM

## 2014-01-29 DIAGNOSIS — K449 Diaphragmatic hernia without obstruction or gangrene: Secondary | ICD-10-CM

## 2014-01-29 DIAGNOSIS — M8580 Other specified disorders of bone density and structure, unspecified site: Secondary | ICD-10-CM

## 2014-01-29 DIAGNOSIS — E785 Hyperlipidemia, unspecified: Secondary | ICD-10-CM

## 2014-01-29 NOTE — Progress Notes (Signed)
Patient ID: Tina Roberts, female   DOB: 07/25/1940, 73 y.o.   MRN: [id number]    Chief Complaint  Patient presents with  . Medical Management of Chronic Issues    3 month follow up  . Immunizations    Discuss getting the two vaccines   No Known Allergies  HPI 73 y/o female pt is here for routine follow up. She has osteopenia and is taking her med and tolerating it fine. She has hiatal hernia with reflux disease, seen by GI and underwent EGD recently. She is now on PPI bid and continues to have globus sensation. Her bp is well controlled. No other concerns. Due for her immunizations today. Has lost some weight since last visit of 5 lbs  Wt Readings from Last 3 Encounters:  01/29/14 119 lb (53.978 kg)  12/13/13 124 lb (56.246 kg)  12/10/13 124 lb (56.246 kg)   Review of Systems  Constitutional: Negative for fever, chills,  malaise/fatigue and diaphoresis.  HENT: Negative for congestion, hearing loss and sore throat.   Eyes: Negative for blurred vision, double vision and discharge.  Respiratory: Negative for  shortness of breath and wheezing.   Cardiovascular: Negative for chest pain, palpitations, orthopnea and leg swelling.  Gastrointestinal: Negative for heartburn, nausea, vomiting, abdominal pain, diarrhea and constipation.  Genitourinary: Negative for dysuria, urgency, frequency and flank pain.  Musculoskeletal: Negative for back pain, falls, joint pain and myalgias.  Skin: Negative for itching and rash.  Neurological: Negative for dizziness, tingling, focal weakness and headaches.  Psychiatric/Behavioral: Negative for depression   Past Medical History  Diagnosis Date  . Hypertension   . Hyperlipidemia   . Osteoporosis   . Kidney stone   . Hypothyroidism   . Hepatic lesion   . History of gallstones   . Vitiligo   . GERD (gastroesophageal reflux disease)   . Vitamin D deficiency   . Myalgia and myositis, unspecified   . Osteoporosis, unspecified   . Reflux esophagitis    . Other dysphagia   . Disorder of bone and cartilage, unspecified   . Lacrimal disorder   . Conjunctivitis unspecified   . Osteopenia   . Unspecified vitamin D deficiency   . Vitiligo   . Osteoarthrosis, unspecified whether generalized or localized, unspecified site   . Pain in joint, forearm   . Unspecified disorder of kidney and ureter   . Unspecified hypothyroidism   . Other and unspecified hyperlipidemia   . Unspecified essential hypertension    Current Outpatient Prescriptions on File Prior to Visit  Medication Sig Dispense Refill  . alendronate (FOSAMAX) 70 MG tablet TAKE 1 TABLET WITH A FULL GLASS OF WATER ON AN EMPTY STOMACH ONCE A WEEK ON THURSDAY  12 tablet  3  . amLODipine (NORVASC) 5 MG tablet TAKE 1 TABLET (5 MG TOTAL) BY MOUTH DAILY.  90 tablet  1  . levothyroxine (SYNTHROID, LEVOTHROID) 75 MCG tablet TAKE 1 TABLET DAILY FOR THYROID  90 tablet  1  . pantoprazole (PROTONIX) 40 MG tablet Take 1 tablet (40 mg total) by mouth 2 (two) times daily before a meal.  60 tablet  3   No current facility-administered medications on file prior to visit.    Physical exam BP 140/87  Pulse 64  Temp(Src) 97.7 F (36.5 C) (Oral)  Ht 5\' 2"  (1.575 m)  Wt 119 lb (53.978 kg)  BMI 21.76 kg/m2  SpO2 96%  General- elderly female in no acute distress, thin built Head- atraumatic, normocephalic Neck- no lymphadenopathy, no  thyromegaly, no jugular vein distension, no carotid bruit Nose- normal nasal mucosa, no maxillary sinus tenderness, no frontal sinus tenderness Mouth- normal mucus membrane, no oral thrush, normal oropharynx Chest- no chest wall deformities, no chest wall tenderness Cardiovascular- normal s1,s2, no murmurs/ rubs/ gallops, normal distal pulses Respiratory- bilateral clear to auscultation, no wheeze, no rhonchi, no crackles Abdomen- bowel sounds present, soft, non tender,no guarding or rigidity, no CVA tenderness Musculoskeletal- able to move all 4 extremities, no leg  edema Neurological- no focal deficit Skin- warm and dry, vitligo Psychiatry- alert and oriented to person, place and time, normal mood and affect   Labs- Lab Results  Component Value Date   TSH 0.510 07/03/2013   Lipid Panel     Component Value Date/Time   TRIG 75 07/03/2013 0953   HDL 55 07/03/2013 0953   CHOLHDL 3.7 07/03/2013 0953   LDLCALC 136* 07/03/2013 0953    Lab Results  Component Value Date   CREATININE 0.98 10/23/2013    12/13/13 ENDOSCOPIC IMPRESSION: 1. Small hiatal hernia 2. The EGD otherwise appeared normal RECOMMENDATIONS: 1. Anti-reflux regimen long term 2. Continue PPI bid 3. Follow up with Dr. Bubba Camp. Consider ENT referral. Symptoms have not improved with PPI bid. Although she has GERD it is not the cause of all her throat, neck and ENT symptoms  Assessment/plan  1. Essential hypertension, benign bp stable, continue amlodipine 5 mg daily. Check renal function today - Basic Metabolic Panel  2. Hiatal hernia Stable, continue PPI bid  3. Gastroesophageal reflux disease, esophagitis presence not specified Continue protonix 40 mg bid for now, has f/u with GI  4. Other specified hypothyroidism Check thyroid level today, continue levothyroxine 75 mcg daily - TSH - Basic Metabolic Panel  5. Osteopenia Recheck dexa as > 2 years. Continue fosamax with ca-vit d supplement. - DG Bone Density; Future  6. CKD (chronic kidney disease) stage 3, GFR 30-59 ml/min Monitor renal function - Basic Metabolic Panel  7. Hyperlipidemia Off all meds, check flp - Lipid Panel  8. Globus sensation Persists, has seen ENT in past with PPI recommended. Will get another opinion again given gi workup not revealing a true cause for her current symptom - Ambulatory referral to ENT  9. Need for pneumococcal vaccination - Pneumococcal conjugate vaccine 13-valent  10. Need for influenza vaccine - influenza vaccine provided

## 2014-01-30 LAB — BASIC METABOLIC PANEL
BUN/Creatinine Ratio: 23 (ref 11–26)
BUN: 22 mg/dL (ref 8–27)
CO2: 24 mmol/L (ref 18–29)
Calcium: 9.6 mg/dL (ref 8.7–10.3)
Chloride: 101 mmol/L (ref 97–108)
Creatinine, Ser: 0.95 mg/dL (ref 0.57–1.00)
GFR calc Af Amer: 69 mL/min/{1.73_m2} (ref >59)
GFR calc non Af Amer: 60 mL/min/{1.73_m2} (ref >59)
Glucose: 79 mg/dL (ref 65–99)
Potassium: 4.1 mmol/L (ref 3.5–5.2)
Sodium: 141 mmol/L (ref 134–144)

## 2014-01-30 LAB — LIPID PANEL
Chol/HDL Ratio: 2.9 ratio units (ref 0.0–4.4)
Cholesterol, Total: 206 mg/dL — ABNORMAL HIGH (ref 100–199)
HDL: 71 mg/dL (ref >39)
LDL Calculated: 115 mg/dL — ABNORMAL HIGH (ref 0–99)
Triglycerides: 100 mg/dL (ref 0–149)
VLDL Cholesterol Cal: 20 mg/dL (ref 5–40)

## 2014-01-30 LAB — TSH: TSH: 0.938 u[IU]/mL (ref 0.450–4.500)

## 2014-03-02 ENCOUNTER — Other Ambulatory Visit: Payer: Self-pay | Admitting: Internal Medicine

## 2014-03-07 ENCOUNTER — Other Ambulatory Visit: Payer: Self-pay | Admitting: Internal Medicine

## 2014-04-01 ENCOUNTER — Other Ambulatory Visit: Payer: Self-pay | Admitting: Internal Medicine

## 2014-04-01 ENCOUNTER — Other Ambulatory Visit: Payer: Self-pay | Admitting: *Deleted

## 2014-04-01 MED ORDER — LEVOTHYROXINE SODIUM 75 MCG PO TABS: ORAL_TABLET | ORAL | Status: DC

## 2014-04-01 NOTE — Telephone Encounter (Signed)
[facility] 

## 2014-04-11 ENCOUNTER — Encounter: Payer: Self-pay | Admitting: Internal Medicine

## 2014-04-11 ENCOUNTER — Ambulatory Visit (INDEPENDENT_AMBULATORY_CARE_PROVIDER_SITE_OTHER): Payer: Commercial Managed Care - HMO | Admitting: Internal Medicine

## 2014-04-11 VITALS — BP 120/68 | HR 73 | Temp 97.4°F | Resp 18 | Wt 116.0 lb

## 2014-04-11 DIAGNOSIS — K449 Diaphragmatic hernia without obstruction or gangrene: Secondary | ICD-10-CM

## 2014-04-11 DIAGNOSIS — M8580 Other specified disorders of bone density and structure, unspecified site: Secondary | ICD-10-CM

## 2014-04-11 DIAGNOSIS — L309 Dermatitis, unspecified: Secondary | ICD-10-CM

## 2014-04-11 DIAGNOSIS — K219 Gastro-esophageal reflux disease without esophagitis: Secondary | ICD-10-CM

## 2014-04-11 MED ORDER — TRIAMCINOLONE 0.1 % CREAM:EUCERIN CREAM 1:1: 1.0000 "application " | TOPICAL_CREAM | Freq: Two times a day (BID) | CUTANEOUS | Status: DC | PRN

## 2014-04-11 NOTE — Patient Instructions (Signed)
Eczema Eczema, also called atopic dermatitis, is a skin disorder that causes inflammation of the skin. It causes a red rash and dry, scaly skin. The skin becomes very itchy. Eczema is generally worse during the cooler winter months and often improves with the warmth of summer. Eczema usually starts showing signs in infancy. Some children outgrow eczema, but it may last through adulthood.  CAUSES  The exact cause of eczema is not known, but it appears to run in families. People with eczema often have a family history of eczema, allergies, asthma, or hay fever. Eczema is not contagious. Flare-ups of the condition may be caused by:   Contact with something you are sensitive or allergic to.   Stress. SIGNS AND SYMPTOMS  Dry, scaly skin.   Red, itchy rash.   Itchiness. This may occur before the skin rash and may be very intense.  DIAGNOSIS  The diagnosis of eczema is usually made based on symptoms and medical history. TREATMENT  Eczema cannot be cured, but symptoms usually can be controlled with treatment and other strategies. A treatment plan might include:  Controlling the itching and scratching.   Use over-the-counter antihistamines as directed for itching. This is especially useful at night when the itching tends to be worse.   Use over-the-counter steroid creams as directed for itching.   Avoid scratching. Scratching makes the rash and itching worse. It may also result in a skin infection (impetigo) due to a break in the skin caused by scratching.   Keeping the skin well moisturized with creams every day. This will seal in moisture and help prevent dryness. Lotions that contain alcohol and water should be avoided because they can dry the skin.   Limiting exposure to things that you are sensitive or allergic to (allergens).   Recognizing situations that cause stress.   Developing a plan to manage stress.  HOME CARE INSTRUCTIONS   Only take over-the-counter or  prescription medicines as directed by your health care provider.   Do not use anything on the skin without checking with your health care provider.   Keep baths or showers short (5 minutes) in warm (not hot) water. Use mild cleansers for bathing. These should be unscented. You may add nonperfumed bath oil to the bath water. It is best to avoid soap and bubble bath.   Immediately after a bath or shower, when the skin is still damp, apply a moisturizing ointment to the entire body. This ointment should be a petroleum ointment. This will seal in moisture and help prevent dryness. The thicker the ointment, the better. These should be unscented.   Keep fingernails cut short. Children with eczema may need to wear soft gloves or mittens at night after applying an ointment.   Dress in clothes made of cotton or cotton blends. Dress lightly, because heat increases itching.   A child with eczema should stay away from anyone with fever blisters or cold sores. The virus that causes fever blisters (herpes simplex) can cause a serious skin infection in children with eczema. SEEK MEDICAL CARE IF:   Your itching interferes with sleep.   Your rash gets worse or is not better within 1 week after starting treatment.   You see pus or soft yellow scabs in the rash area.   You have a fever.   You have a rash flare-up after contact with someone who has fever blisters.  Document Released: 03/18/2000 Document Revised: 01/09/2013 Document Reviewed: 10/22/2012 Exit[employer] Patient Information 2015 [employer]. This information   is not intended to replace advice given to you by your health care provider. Make sure you discuss any questions you have with your health care provider.  

## 2014-04-11 NOTE — Progress Notes (Signed)
Patient ID: Tina Roberts, female   DOB: 11-08-1940, 74 y.o.   MRN: [id number]    Facility  [facility]    Place of Service:   OFFICE   No Known Allergies  Chief Complaint  Patient presents with  . Acute Visit    red spots on left breast; hx vitiligo    HPI:   74 yo female seen today for an acute visit for above. She noticed spots on her breast x 1-2 months. Appears dry at times. No itching. No d/c.  Last mammogram in May 2015 nml. No personal or FHx skin cancer or breast cancer. No f/c. No breast pain. She has ACW and cough but has acid reflux. Reflux extends to her throat and causes her to gag. She has taken fosamax x several yrs. Last DXA in 2013 and showed osteopenia  Medications: Patient's Medications  New Prescriptions   No medications on file  Previous Medications   ALENDRONATE (FOSAMAX) 70 MG TABLET    TAKE 1 TABLET WITH A FULL GLASS OF WATER ON AN EMPTY STOMACH ONCE A WEEK ON THURSDAY   AMLODIPINE (NORVASC) 5 MG TABLET    TAKE 1 TABLET (5 MG TOTAL) BY MOUTH DAILY.   CIMETIDINE (TAGAMET) 200 MG TABLET    Take 200 mg by mouth 2 (two) times daily.   LEVOTHYROXINE (SYNTHROID, LEVOTHROID) 75 MCG TABLET    Take one tablet by mouth once daily for thyroid   PANTOPRAZOLE (PROTONIX) 40 MG TABLET    TAKE 1 TABLET BY MOUTH TWICE DAILY BEFORE A MEAL.  Modified Medications   No medications on file  Discontinued Medications   PANTOPRAZOLE (PROTONIX) 40 MG TABLET    TAKE 1 TABLET BY MOUTH TWICE DAILY BEFORE A MEAL.     Review of Systems  As above. All other systems reviewed are negative   Filed Vitals:   04/11/14 1031  BP: 120/68  Pulse: 73  Temp: 97.4 F (36.3 C)  TempSrc: Oral  Resp: 18  Weight: 116 lb (52.617 kg)  SpO2: 95%   Body mass index is 21.21 kg/(m^2).  Physical Exam  CONSTITUTIONAL: Looks well in NAD. Awake, alert and oriented x 3  CVS: Regular rate without murmur, gallop or rub. LUNGS: CTA b/l no wheezing, rales or rhonchi. EXTREMITIES: No edema b/l. Distal  pulses palpable. No calf tenderness PSYCH: Affect, behavior and mood normal  BREASTS: no palpable mass, nipple d/c or skin dimpling b/l.  SKIN: eczematous patches on trunk. No vesicles. Left breast raised areas with  Excoriations. No d/c or TTP.   Labs reviewed: Office Visit on 01/29/2014  Component Date Value Ref Range Status  . Cholesterol, Total 01/29/2014 206* 100 - 199 mg/dL Final                 **Please note reference interval change**  . Triglycerides 01/29/2014 100  0 - 149 mg/dL Final                 **Please note reference interval change**  . HDL 01/29/2014 71  >39 mg/dL Final   Comment: According to ATP-III Guidelines, HDL-C >59 mg/dL is considered a                          negative risk factor for CHD.  Marland Kitchen VLDL Cholesterol Cal 01/29/2014 20  5 - 40 mg/dL Final  . LDL Calculated 01/29/2014 115* 0 - 99 mg/dL Final                 **  Please note reference interval change**  . Chol/HDL Ratio 01/29/2014 2.9  0.0 - 4.4 ratio units Final   Comment:                                   T. Chol/HDL Ratio                                                                      Men  Women                                                        1/2 Avg.Risk  3.4    3.3                                                            Avg.Risk  5.0    4.4                                                         2X Avg.Risk  9.6    7.1                                                         3X Avg.Risk 23.4   11.0  . TSH 01/29/2014 0.938  0.450 - 4.500 uIU/mL Final  . Glucose 01/29/2014 79  65 - 99 mg/dL Final  . BUN 01/29/2014 22  8 - 27 mg/dL Final  . Creatinine, Ser 01/29/2014 0.95  0.57 - 1.00 mg/dL Final  . GFR calc non Af Amer 01/29/2014 60  >59 mL/min/1.73 Final  . GFR calc Af Amer 01/29/2014 69  >59 mL/min/1.73 Final  . BUN/Creatinine Ratio 01/29/2014 23  11 - 26 Final  . Sodium 01/29/2014 141  134 - 144 mmol/L Final  . Potassium 01/29/2014 4.1  3.5 - 5.2 mmol/L Final  . Chloride 01/29/2014  101  97 - 108 mmol/L Final  . CO2 01/29/2014 24  18 - 29 mmol/L Final  . Calcium 01/29/2014 9.6  8.7 - 10.3 mg/dL Final     Assessment/Plan    ICD-9-CM ICD-10-CM   1. Eczematous dermatitis 692.9 L30.9 Triamcinolone Acetonide (TRIAMCINOLONE 0.1 % CREAM : EUCERIN) CREA  2. Osteopenia 733.90 M85.80   3. Hiatal hernia 553.3 K44.9   4. Gastroesophageal reflux disease, esophagitis presence not specified 530.81 K21.9     - STOP fosamax due to worsening GI upset  - DXA scan  - keep appt with Dr Bubba Camp in March as scheduled  -  generic kenalog to dry skin BID prn  - liberal use of moisturizing lotion  Launa Goedken S. Perlie Gold  [facility] [address] [place], [place] [address] [phone] Office (Wednesdays and Fridays 8 AM - 5 PM) [phone] Cell (Monday-Friday 8 AM - 5 PM)

## 2014-04-21 ENCOUNTER — Ambulatory Visit (HOSPITAL_COMMUNITY)
Admission: RE | Admit: 2014-04-21 | Discharge: 2014-04-21 | Disposition: A | Discharge status: Home / Self Care | Payer: Commercial Managed Care - HMO | Source: Ambulatory Visit | Attending: Internal Medicine | Admitting: Internal Medicine

## 2014-04-21 DIAGNOSIS — M8580 Other specified disorders of bone density and structure, unspecified site: Secondary | ICD-10-CM

## 2014-04-21 DIAGNOSIS — Z13820 Encounter for screening for osteoporosis: Secondary | ICD-10-CM | POA: Insufficient documentation

## 2014-04-21 DIAGNOSIS — Z780 Asymptomatic menopausal state: Secondary | ICD-10-CM | POA: Diagnosis not present

## 2014-04-21 LAB — HM DEXA SCAN

## 2014-04-24 ENCOUNTER — Encounter: Payer: Self-pay | Admitting: *Deleted

## 2014-05-26 ENCOUNTER — Other Ambulatory Visit: Payer: Self-pay | Admitting: Internal Medicine

## 2014-06-03 ENCOUNTER — Encounter: Payer: Self-pay | Admitting: Internal Medicine

## 2014-06-03 ENCOUNTER — Ambulatory Visit (INDEPENDENT_AMBULATORY_CARE_PROVIDER_SITE_OTHER): Payer: Commercial Managed Care - HMO | Admitting: Internal Medicine

## 2014-06-03 VITALS — BP 130/76 | HR 75 | Temp 98.2°F | Ht 62.0 in | Wt 115.4 lb

## 2014-06-03 DIAGNOSIS — K449 Diaphragmatic hernia without obstruction or gangrene: Secondary | ICD-10-CM

## 2014-06-03 DIAGNOSIS — F458 Other somatoform disorders: Secondary | ICD-10-CM

## 2014-06-03 DIAGNOSIS — K219 Gastro-esophageal reflux disease without esophagitis: Secondary | ICD-10-CM | POA: Diagnosis not present

## 2014-06-03 DIAGNOSIS — K117 Disturbances of salivary secretion: Secondary | ICD-10-CM

## 2014-06-03 DIAGNOSIS — E038 Other specified hypothyroidism: Secondary | ICD-10-CM

## 2014-06-03 DIAGNOSIS — M8580 Other specified disorders of bone density and structure, unspecified site: Secondary | ICD-10-CM | POA: Diagnosis not present

## 2014-06-03 DIAGNOSIS — R0989 Other specified symptoms and signs involving the circulatory and respiratory systems: Secondary | ICD-10-CM

## 2014-06-03 DIAGNOSIS — I10 Essential (primary) hypertension: Secondary | ICD-10-CM | POA: Diagnosis not present

## 2014-06-03 DIAGNOSIS — R198 Other specified symptoms and signs involving the digestive system and abdomen: Secondary | ICD-10-CM

## 2014-06-03 MED ORDER — CIMETIDINE 200 MG PO TABS: 200.0000 mg | ORAL_TABLET | Freq: Three times a day (TID) | ORAL | Status: DC

## 2014-06-03 MED ORDER — CALCIUM CARBONATE-VITAMIN D 500-200 MG-UNIT PO TABS: 1.0000 | ORAL_TABLET | Freq: Two times a day (BID) | ORAL | Status: DC

## 2014-06-03 MED ORDER — PANTOPRAZOLE SODIUM 40 MG PO TBEC: 40.0000 mg | DELAYED_RELEASE_TABLET | Freq: Every day | ORAL | Status: DC

## 2014-06-03 MED ORDER — ZOSTER VACCINE LIVE 19400 UNT/0.65ML ~~LOC~~ SOLR: 0.6500 mL | Freq: Once | SUBCUTANEOUS | Status: DC

## 2014-06-03 MED ORDER — DENOSUMAB 60 MG/ML ~~LOC~~ SOLN: 60.0000 mg | SUBCUTANEOUS | Status: DC

## 2014-06-03 NOTE — Progress Notes (Signed)
Patient ID: Tina Roberts, female   DOB: 01-21-1941, 74 y.o.   MRN: [id number]    Chief Complaint  Patient presents with  . Medical Management of Chronic Issues    5 month follow-up, no recent labs   . Medication Management    Discuss if patient should continue Fosamax. Protonix is no longer helping, discuss alternative. Patient never picked up cream for rash (too expensive), discuss alternative   . Other    Patient c/o excessive salivia  . Immunizations    Insurance will not cover TDAP, rx given for shingles vaccine    No Known Allergies  HPI 74 y/o female pt is here for routine follow up. She has osteopenia, reviewed dexa from 04/21/14 showing t score of -2.4. It was -2.3 2 years back. Currently on fosamax. Denies any fall.  bp has been stable Her hiatal hernia with reflux disease has been bothering her. She has been seen by GI and lifelong ppi has been recommended.  She mentions that protonix is not helping her Weight has remained stable compared to visit in jan 2016 She continues to have globus sensation and has excessive salivation.   Review of Systems  Constitutional: Negative for fever, chills,  malaise/fatigue and diaphoresis.  HENT: Negative for congestion, hearing loss and sore throat.   Eyes: Negative for blurred vision, double vision and discharge.  Respiratory: Negative for  shortness of breath and wheezing.   Cardiovascular: Negative for chest pain, palpitations, orthopnea and leg swelling.  Gastrointestinal: Negative for heartburn, nausea, vomiting, abdominal pain, diarrhea and constipation.  Genitourinary: Negative for dysuria Musculoskeletal: Negative for back pain, falls.  Skin: Negative for itching and rash.  Neurological: Negative for dizziness, tingling Psychiatric/Behavioral: Negative for depression   Past Medical History  Diagnosis Date  . Hypertension   . Hyperlipidemia   . Osteoporosis   . Kidney stone   . Hypothyroidism   . Hepatic lesion   .  History of gallstones   . Vitiligo   . GERD (gastroesophageal reflux disease)   . Vitamin D deficiency   . Myalgia and myositis, unspecified   . Osteoporosis, unspecified   . Reflux esophagitis   . Other dysphagia   . Disorder of bone and cartilage, unspecified   . Lacrimal disorder   . Conjunctivitis unspecified   . Osteopenia   . Unspecified vitamin D deficiency   . Vitiligo   . Osteoarthrosis, unspecified whether generalized or localized, unspecified site   . Pain in joint, forearm   . Unspecified disorder of kidney and ureter   . Unspecified hypothyroidism   . Other and unspecified hyperlipidemia   . Unspecified essential hypertension    Medication reviewed. See [facility]   Physical exam  BP 130/76 mmHg  Pulse 75  Temp(Src) 98.2 F (36.8 C) (Oral)  Ht 5\' 2"  (1.575 m)  Wt 115 lb 6.4 oz (52.345 kg)  BMI 21.10 kg/m2  SpO2 99%  Wt Readings from Last 3 Encounters:  06/03/14 115 lb 6.4 oz (52.345 kg)  04/11/14 116 lb (52.617 kg)  01/29/14 119 lb (53.978 kg)   General- elderly female in no acute distress, thin built Head- atraumatic, normocephalic Neck- no lymphadenopathy, no thyromegaly, no jugular vein distension, no carotid bruit Nose- normal nasal mucosa, no maxillary sinus tenderness, no frontal sinus tenderness Mouth- normal mucus membrane, no oral thrush, normal oropharynx Chest- no chest wall deformities, no chest wall tenderness Cardiovascular- normal s1,s2, no murmurs/ rubs/ gallops, normal distal pulses Respiratory- bilateral clear to auscultation, no wheeze, no  rhonchi, no crackles Abdomen- bowel sounds present, soft, non tender,no guarding or rigidity, no CVA tenderness Musculoskeletal- able to move all 4 extremities, no leg edema Neurological- no focal deficit Psychiatry- alert and oriented to person, place and time, normal mood and affect   labs  CMP Latest Ref Rng 01/29/2014 10/23/2013 07/03/2013  Glucose 65 - 99 mg/dL 79 104(H) 93  BUN 8 - 27 mg/dL 22 14  18   Creatinine 0.57 - 1.00 mg/dL 0.95 0.98 0.85  Sodium 134 - 144 mmol/L 141 143 143  Potassium 3.5 - 5.2 mmol/L 4.1 3.7 3.9  Chloride 97 - 108 mmol/L 101 104 103  CO2 18 - 29 mmol/L 24 25 22   Calcium 8.7 - 10.3 mg/dL 9.6 9.6 9.1  Total Protein 6.0 - 8.5 g/dL - - 7.0  Albumin 3.5 - 4.8 g/dL - - 4.0  Total Bilirubin 0.0 - 1.2 mg/dL - - 0.2  Alkaline Phos 39 - 117 IU/L - - 43  AST 0 - 40 IU/L - - 22  ALT 0 - 32 IU/L - - 16   Lab Results  Component Value Date   TSH 0.938 01/29/2014    Assessment/plan  1. Osteopenia Has progressed on review of dexa scan. Change fosamax to prolia injection twice a year. Add oscal with vitamin d. Continue weight bearing exercises. - CMP  2. Sliding hiatal hernia Her reflux symptom has worsened and this could be contributing to her globus sensation, change protonix to 40 mg bid and start cimetidine 200 mg tid for now.   3. Essential hypertension, benign Stable, continue amlodipine 5 mg daily - CMP  4. Gastroesophageal reflux disease, esophagitis presence not specified Increase protonix to 40 mg bid and reassess. Introduce cimetidine 200 mg tid  5. Other specified hypothyroidism Check tsh. Continue syntrhoid 75 mcg daily - TSH  6. Globus sensation Start cimetidine and adjusted dosing of ppi, reassess  7. Hypersecretion of saliva See above  Script for shingles vaccine provided

## 2014-06-04 LAB — COMPREHENSIVE METABOLIC PANEL
ALT: 18 IU/L (ref 0–32)
AST: 28 IU/L (ref 0–40)
Albumin/Globulin Ratio: 1.4 (ref 1.1–2.5)
Albumin: 4.3 g/dL (ref 3.5–4.8)
Alkaline Phosphatase: 47 IU/L (ref 39–117)
BUN/Creatinine Ratio: 18 (ref 11–26)
BUN: 18 mg/dL (ref 8–27)
Bilirubin Total: 0.3 mg/dL (ref 0.0–1.2)
CO2: 24 mmol/L (ref 18–29)
Calcium: 10 mg/dL (ref 8.7–10.3)
Chloride: 99 mmol/L (ref 97–108)
Creatinine, Ser: 0.98 mg/dL (ref 0.57–1.00)
GFR calc Af Amer: 67 mL/min/{1.73_m2} (ref >59)
GFR calc non Af Amer: 58 mL/min/{1.73_m2} — ABNORMAL LOW (ref >59)
Globulin, Total: 3.1 g/dL (ref 1.5–4.5)
Glucose: 87 mg/dL (ref 65–99)
Potassium: 4 mmol/L (ref 3.5–5.2)
Sodium: 141 mmol/L (ref 134–144)
Total Protein: 7.4 g/dL (ref 6.0–8.5)

## 2014-06-04 LAB — TSH: TSH: 0.558 u[IU]/mL (ref 0.450–4.500)

## 2014-07-16 ENCOUNTER — Other Ambulatory Visit: Payer: Self-pay

## 2014-07-16 DIAGNOSIS — Z1231 Encounter for screening mammogram for malignant neoplasm of breast: Secondary | ICD-10-CM

## 2014-08-03 ENCOUNTER — Other Ambulatory Visit: Payer: Self-pay | Admitting: Internal Medicine

## 2014-08-04 ENCOUNTER — Ambulatory Visit
Admission: RE | Admit: 2014-08-04 | Discharge: 2014-08-04 | Disposition: A | Discharge status: Home / Self Care | Payer: Commercial Managed Care - HMO | Source: Ambulatory Visit

## 2014-08-04 DIAGNOSIS — Z1231 Encounter for screening mammogram for malignant neoplasm of breast: Secondary | ICD-10-CM

## 2014-09-03 ENCOUNTER — Ambulatory Visit (INDEPENDENT_AMBULATORY_CARE_PROVIDER_SITE_OTHER): Payer: Commercial Managed Care - HMO | Admitting: Internal Medicine

## 2014-09-03 ENCOUNTER — Encounter: Payer: Self-pay | Admitting: Internal Medicine

## 2014-09-03 VITALS — BP 118/66 | HR 77 | Temp 98.0°F | Resp 20 | Ht 62.0 in | Wt 113.6 lb

## 2014-09-03 DIAGNOSIS — J302 Other seasonal allergic rhinitis: Secondary | ICD-10-CM

## 2014-09-03 DIAGNOSIS — I10 Essential (primary) hypertension: Secondary | ICD-10-CM | POA: Diagnosis not present

## 2014-09-03 DIAGNOSIS — E038 Other specified hypothyroidism: Secondary | ICD-10-CM

## 2014-09-03 DIAGNOSIS — K219 Gastro-esophageal reflux disease without esophagitis: Secondary | ICD-10-CM | POA: Diagnosis not present

## 2014-09-03 DIAGNOSIS — N898 Other specified noninflammatory disorders of vagina: Secondary | ICD-10-CM | POA: Diagnosis not present

## 2014-09-03 DIAGNOSIS — L309 Dermatitis, unspecified: Secondary | ICD-10-CM | POA: Diagnosis not present

## 2014-09-03 DIAGNOSIS — E785 Hyperlipidemia, unspecified: Secondary | ICD-10-CM

## 2014-09-03 DIAGNOSIS — N183 Chronic kidney disease, stage 3 unspecified: Secondary | ICD-10-CM

## 2014-09-03 DIAGNOSIS — E034 Atrophy of thyroid (acquired): Secondary | ICD-10-CM

## 2014-09-03 MED ORDER — LORATADINE 10 MG PO TABS: 10.0000 mg | ORAL_TABLET | Freq: Every day | ORAL | Status: DC

## 2014-09-03 MED ORDER — PREDNISONE 10 MG PO TABS: ORAL_TABLET | ORAL | Status: DC

## 2014-09-03 MED ORDER — METRONIDAZOLE 0.75 % VA GEL: 1.0000 | Freq: Every day | VAGINAL | Status: DC

## 2014-09-03 NOTE — Progress Notes (Signed)
Patient ID: Tina Roberts, female   DOB: [birth year], 74 y.o.   MRN: [id number]    Location:    [facility]   Place of Service:   OFFICE    Chief Complaint  Patient presents with  . Medical Management of Chronic Issues    3 month follow-up   HPI:  74 yo female seen today for f/u. She c/o "something up" in her throat "all the time" and she is spitting a lot. Denies sinus pressure or rhinorrhea. She was told it was post nasal drip in the past. Nothing tried OTC.  She has hx elevated cholesterol but controls it with diet and exercise  She is taking calcium w D for osteoporosis. Fosamax stopped several mos ago due to ADRs. Never tried prolia injections  BP stable on amlodipine  She has GERD/HH and takes tagamet and protonix.   Past Medical History  Diagnosis Date  . Hypertension   . Hyperlipidemia   . Osteoporosis   . Kidney stone   . Hypothyroidism   . Hepatic lesion   . History of gallstones   . Vitiligo   . GERD (gastroesophageal reflux disease)   . Vitamin D deficiency   . Myalgia and myositis, unspecified   . Osteoporosis, unspecified   . Reflux esophagitis   . Other dysphagia   . Disorder of bone and cartilage, unspecified   . Lacrimal disorder   . Conjunctivitis unspecified   . Osteopenia   . Unspecified vitamin D deficiency   . Vitiligo   . Osteoarthrosis, unspecified whether generalized or localized, unspecified site   . Pain in joint, forearm   . Unspecified disorder of kidney and ureter   . Unspecified hypothyroidism   . Other and unspecified hyperlipidemia   . Unspecified essential hypertension     Past Surgical History  Procedure Laterality Date  . Tubal ligation  1973  . Leg surgery      right    Patient Care Team: Blanchie Serve, MD as PCP - General (Internal Medicine)  History   Social History  . Marital Status: Single    Spouse Name: N/A  . Number of Children: 4  . Years of Education: N/A   Occupational History  . Not on file.   Social  History Main Topics  . Smoking status: Never Smoker   . Smokeless tobacco: Never Used  . Alcohol Use: No  . Drug Use: No  . Sexual Activity: Not on file   Other Topics Concern  . Not on file   Social History Narrative     reports that she has never smoked. She has never used smokeless tobacco. She reports that she does not drink alcohol or use illicit drugs.  No Known Allergies  Medications: Patient's Medications  New Prescriptions   No medications on file  Previous Medications   AMLODIPINE (NORVASC) 5 MG TABLET    TAKE 1 TABLET (5 MG TOTAL) BY MOUTH DAILY.   CALCIUM-VITAMIN D (OSCAL 500/200 D-3) 500-200 MG-UNIT PER TABLET    Take 1 tablet by mouth 2 (two) times daily.   CIMETIDINE (TAGAMET) 200 MG TABLET    TAKE 1 TABLET (200 MG TOTAL) BY MOUTH 3 (THREE) TIMES DAILY.   DENOSUMAB (PROLIA) 60 MG/ML SOLN INJECTION    Inject 60 mg into the skin every 6 (six) months. Administer in upper arm, thigh, or abdomen   LEVOTHYROXINE (SYNTHROID, LEVOTHROID) 75 MCG TABLET    Take one tablet by mouth once daily for thyroid   PANTOPRAZOLE (PROTONIX)  40 MG TABLET    Take 1 tablet (40 mg total) by mouth daily.   ZOSTER VACCINE LIVE, PF, (ZOSTAVAX) 26948 UNT/0.65ML INJECTION    Inject 19,400 Units into the skin once.  Modified Medications   No medications on file  Discontinued Medications   No medications on file    Review of Systems  Constitutional: Negative for fever, chills, diaphoresis, activity change, appetite change and fatigue.  HENT: Positive for postnasal drip. Negative for ear pain and sore throat.   Eyes: Negative for visual disturbance.  Respiratory: Negative for cough, chest tightness and shortness of breath.   Cardiovascular: Negative for chest pain, palpitations and leg swelling.  Gastrointestinal: Negative for nausea, vomiting, abdominal pain, diarrhea, constipation and blood in stool.  Genitourinary: Positive for vaginal discharge (green intermittent d/c; no itching; x  several mos. she is not sexually active). Negative for dysuria.  Musculoskeletal: Negative for arthralgias.  Skin: Negative for rash.  Neurological: Negative for dizziness, tremors, numbness and headaches.  Psychiatric/Behavioral: Negative for sleep disturbance. The patient is not nervous/anxious.     Filed Vitals:   09/03/14 1025  BP: 118/66  Pulse: 77  Temp: 98 F (36.7 C)  TempSrc: Oral  Resp: 20  Height: 5\' 2"  (1.575 m)  Weight: 113 lb 9.6 oz (51.529 kg)  SpO2: 97%   Body mass index is 20.77 kg/(m^2).  Physical Exam  Constitutional: She is oriented to person, place, and time. She appears well-developed and well-nourished.  HENT:  Mouth/Throat: No oropharyngeal exudate.  R>L TM dull with air fluid level. No redness or bulging. Maxillary sinus boggy tissue texture changes. Nares with enlarged grey dry turbinates. Oropharynx with cobblestoning appearance and min redness but no exudate. No tonsillar exudates. MMM  Eyes: Pupils are equal, round, and reactive to light. Right eye exhibits no discharge. Left eye exhibits no discharge. No scleral icterus.  Neck: Neck supple. No tracheal deviation present. No thyromegaly present.  Cardiovascular: Normal rate, regular rhythm, normal heart sounds and intact distal pulses.  Exam reveals no gallop and no friction rub.   No murmur heard. No LE edema b/l. no calf TTP. No carotid bruit b/l  Pulmonary/Chest: Effort normal and breath sounds normal. No stridor. No respiratory distress. She has no wheezes. She has no rales.  Abdominal: Soft. Bowel sounds are normal. She exhibits no distension and no mass. There is no tenderness. There is no rebound and no guarding.  Lymphadenopathy:    She has no cervical adenopathy.  Neurological: She is alert and oriented to person, place, and time. She has normal reflexes.  Skin: Skin is warm and dry. Rash (eczematous on b/l UE. (+) vitiligo) noted.  Psychiatric: She has a normal mood and affect. Her behavior  is normal. Judgment and thought content normal.     Labs reviewed: No visits with results within 3 Month(s) from this visit. Latest known visit with results is:  Office Visit on 06/03/2014  Component Date Value Ref Range Status  . TSH 06/03/2014 0.558  0.450 - 4.500 uIU/mL Final  . Glucose 06/03/2014 87  65 - 99 mg/dL Final  . BUN 06/03/2014 18  8 - 27 mg/dL Final  . Creatinine, Ser 06/03/2014 0.98  0.57 - 1.00 mg/dL Final  . GFR calc non Af Amer 06/03/2014 58* >59 mL/min/1.73 Final  . GFR calc Af Amer 06/03/2014 67  >59 mL/min/1.73 Final  . BUN/Creatinine Ratio 06/03/2014 18  11 - 26 Final  . Sodium 06/03/2014 141  134 - 144 mmol/L Final  .  Potassium 06/03/2014 4.0  3.5 - 5.2 mmol/L Final  . Chloride 06/03/2014 99  97 - 108 mmol/L Final  . CO2 06/03/2014 24  18 - 29 mmol/L Final  . Calcium 06/03/2014 10.0  8.7 - 10.3 mg/dL Final  . Total Protein 06/03/2014 7.4  6.0 - 8.5 g/dL Final  . Albumin 06/03/2014 4.3  3.5 - 4.8 g/dL Final  . Globulin, Total 06/03/2014 3.1  1.5 - 4.5 g/dL Final  . Albumin/Globulin Ratio 06/03/2014 1.4  1.1 - 2.5 Final  . Bilirubin Total 06/03/2014 0.3  0.0 - 1.2 mg/dL Final  . Alkaline Phosphatase 06/03/2014 47  39 - 117 IU/L Final  . AST 06/03/2014 28  0 - 40 IU/L Final  . ALT 06/03/2014 18  0 - 32 IU/L Final    Mm Screening Breast Tomo Bilateral  08/04/2014   CLINICAL DATA:  Screening.  EXAM: DIGITAL SCREENING BILATERAL MAMMOGRAM WITH 3D TOMO WITH CAD  COMPARISON:  Previous exam(s).  ACR Breast Density Category b: There are scattered areas of fibroglandular density.  FINDINGS: There are no findings suspicious for malignancy. Images were processed with CAD.  IMPRESSION: No mammographic evidence of malignancy. A result letter of this screening mammogram will be mailed directly to the patient.  RECOMMENDATION: Screening mammogram in one year. (Code:[id number]-B-01Y)  BI-RADS CATEGORY  1: Negative.   Electronically Signed   By: Curlene Dolphin M.D.   On: 08/04/2014 16:44       Assessment/Plan   ICD-9-CM ICD-10-CM   1. Other seasonal allergic rhinitis 477.8 J30.2 loratadine (CLARITIN) 10 MG tablet     predniSONE (DELTASONE) 10 MG tablet  2. Eczematous dermatitis 692.9 L30.9 predniSONE (DELTASONE) 10 MG tablet  3. Gastroesophageal reflux disease, esophagitis presence not specified 530.81 K21.9   4. Hypothyroidism due to acquired atrophy of thyroid 244.8 E03.8 CBC with Differential   246.8 E03.4 TSH     T4, Free  5. Essential hypertension, benign 401.1 I10 CBC with Differential     CMP     Urinalysis with Reflex Microscopic  6. CKD (chronic kidney disease) stage 3, GFR 30-59 ml/min 585.3 N18.3 CMP  7. Hyperlipidemia 272.4 E78.5 Lipid Panel  8. Vaginal discharge  - probable BV; tx empirically with metrogel qhs x 5 days  623.5 N89.8 metroNIDAZOLE (METROGEL) 0.75 % vaginal gel   --Use saline nasal spray as needed to keep nose moist  --Try OTC generic plain claritin 10mg  daily for seasonal allergy  --Take prednisone taper as directed  --Moisturizing lotion at least twice daily to reduce skin dryness  --Continue other medications as ordered  --Follow up in 4 mos for CPE. Fasting labs prior to appt  Turner S. Perlie Gold  [facility] [address] [place], [place] [address] [phone] Cell (Monday-Friday 8 AM - 5 PM) [phone] After 5 PM and follow prompts

## 2014-09-03 NOTE — Patient Instructions (Addendum)
Use saline nasal spray as needed to keep nose moist  Try OTC generic plain claritin 10mg  daily for seasonal allergy  Take prednisone taper as directed  Moisturizing lotion at least twice daily to reduce skin dryness  Continue other medications as ordered  Follow up in 4 mos for CPE. Fasting labs prior to appt

## 2014-09-22 ENCOUNTER — Other Ambulatory Visit: Payer: Self-pay | Admitting: Internal Medicine

## 2014-09-29 ENCOUNTER — Other Ambulatory Visit: Payer: Self-pay | Admitting: Internal Medicine

## 2014-11-24 ENCOUNTER — Encounter: Payer: Self-pay | Admitting: Gastroenterology

## 2015-01-05 ENCOUNTER — Other Ambulatory Visit: Payer: Commercial Managed Care - HMO

## 2015-01-05 DIAGNOSIS — E785 Hyperlipidemia, unspecified: Secondary | ICD-10-CM

## 2015-01-05 DIAGNOSIS — N183 Chronic kidney disease, stage 3 unspecified: Secondary | ICD-10-CM

## 2015-01-05 DIAGNOSIS — E034 Atrophy of thyroid (acquired): Secondary | ICD-10-CM

## 2015-01-05 DIAGNOSIS — I10 Essential (primary) hypertension: Secondary | ICD-10-CM

## 2015-01-06 LAB — COMPREHENSIVE METABOLIC PANEL
ALT: 20 IU/L (ref 0–32)
AST: 32 IU/L (ref 0–40)
Albumin/Globulin Ratio: 1.7 (ref 1.1–2.5)
Albumin: 4.5 g/dL (ref 3.5–4.8)
Alkaline Phosphatase: 57 IU/L (ref 39–117)
BUN/Creatinine Ratio: 14 (ref 11–26)
BUN: 15 mg/dL (ref 8–27)
Bilirubin Total: 0.5 mg/dL (ref 0.0–1.2)
CO2: 25 mmol/L (ref 18–29)
Calcium: 9.9 mg/dL (ref 8.7–10.3)
Chloride: 101 mmol/L (ref 97–108)
Creatinine, Ser: 1.07 mg/dL — ABNORMAL HIGH (ref 0.57–1.00)
GFR calc Af Amer: 60 mL/min/{1.73_m2} (ref >59)
GFR calc non Af Amer: 52 mL/min/{1.73_m2} — ABNORMAL LOW (ref >59)
Globulin, Total: 2.7 g/dL (ref 1.5–4.5)
Glucose: 84 mg/dL (ref 65–99)
Potassium: 4.2 mmol/L (ref 3.5–5.2)
Sodium: 142 mmol/L (ref 134–144)
Total Protein: 7.2 g/dL (ref 6.0–8.5)

## 2015-01-06 LAB — CBC WITH DIFFERENTIAL/PLATELET
Basophils Absolute: 0 10*3/uL (ref 0.0–0.2)
Basos: 1 %
EOS (ABSOLUTE): 0.2 10*3/uL (ref 0.0–0.4)
Eos: 5 %
Hematocrit: 41.9 % (ref 34.0–46.6)
Hemoglobin: 13.3 g/dL (ref 11.1–15.9)
Immature Grans (Abs): 0 10*3/uL (ref 0.0–0.1)
Immature Granulocytes: 1 %
Lymphocytes Absolute: 1.7 10*3/uL (ref 0.7–3.1)
Lymphs: 43 %
MCH: 27.7 pg (ref 26.6–33.0)
MCHC: 31.7 g/dL (ref 31.5–35.7)
MCV: 87 fL (ref 79–97)
Monocytes Absolute: 0.4 10*3/uL (ref 0.1–0.9)
Monocytes: 9 %
Neutrophils Absolute: 1.6 10*3/uL (ref 1.4–7.0)
Neutrophils: 41 %
Platelets: 210 10*3/uL (ref 150–379)
RBC: 4.8 x10E6/uL (ref 3.77–5.28)
RDW: 14.3 % (ref 12.3–15.4)
WBC: 3.9 10*3/uL (ref 3.4–10.8)

## 2015-01-06 LAB — LIPID PANEL
Chol/HDL Ratio: 2.9 ratio units (ref 0.0–4.4)
Cholesterol, Total: 200 mg/dL — ABNORMAL HIGH (ref 100–199)
HDL: 68 mg/dL (ref >39)
LDL Calculated: 104 mg/dL — ABNORMAL HIGH (ref 0–99)
Triglycerides: 142 mg/dL (ref 0–149)
VLDL Cholesterol Cal: 28 mg/dL (ref 5–40)

## 2015-01-06 LAB — URINALYSIS, ROUTINE W REFLEX MICROSCOPIC
Bilirubin, UA: NEGATIVE
Glucose, UA: NEGATIVE
Ketones, UA: NEGATIVE
Leukocytes, UA: NEGATIVE
Nitrite, UA: NEGATIVE
Protein, UA: NEGATIVE
RBC, UA: NEGATIVE
Specific Gravity, UA: 1.016 (ref 1.005–1.030)
Urobilinogen, Ur: 0.2 mg/dL (ref 0.2–1.0)
pH, UA: 5.5 (ref 5.0–7.5)

## 2015-01-06 LAB — TSH: TSH: 2.02 u[IU]/mL (ref 0.450–4.500)

## 2015-01-06 LAB — T4, FREE: Free T4: 1.51 ng/dL (ref 0.82–1.77)

## 2015-01-09 ENCOUNTER — Ambulatory Visit (INDEPENDENT_AMBULATORY_CARE_PROVIDER_SITE_OTHER): Payer: Commercial Managed Care - HMO | Admitting: Internal Medicine

## 2015-01-09 ENCOUNTER — Encounter: Payer: Self-pay | Admitting: Internal Medicine

## 2015-01-09 VITALS — HR 72 | Temp 98.3°F | Resp 20 | Ht 62.0 in | Wt 113.2 lb

## 2015-01-09 DIAGNOSIS — E038 Other specified hypothyroidism: Secondary | ICD-10-CM

## 2015-01-09 DIAGNOSIS — E785 Hyperlipidemia, unspecified: Secondary | ICD-10-CM | POA: Diagnosis not present

## 2015-01-09 DIAGNOSIS — E034 Atrophy of thyroid (acquired): Secondary | ICD-10-CM | POA: Diagnosis not present

## 2015-01-09 DIAGNOSIS — N183 Chronic kidney disease, stage 3 unspecified: Secondary | ICD-10-CM

## 2015-01-09 DIAGNOSIS — J302 Other seasonal allergic rhinitis: Secondary | ICD-10-CM

## 2015-01-09 DIAGNOSIS — I10 Essential (primary) hypertension: Secondary | ICD-10-CM | POA: Diagnosis not present

## 2015-01-09 DIAGNOSIS — Z23 Encounter for immunization: Secondary | ICD-10-CM

## 2015-01-09 DIAGNOSIS — M8580 Other specified disorders of bone density and structure, unspecified site: Secondary | ICD-10-CM

## 2015-01-09 DIAGNOSIS — Z0000 Encounter for general adult medical examination without abnormal findings: Secondary | ICD-10-CM | POA: Diagnosis not present

## 2015-01-09 DIAGNOSIS — K219 Gastro-esophageal reflux disease without esophagitis: Secondary | ICD-10-CM

## 2015-01-09 DIAGNOSIS — K449 Diaphragmatic hernia without obstruction or gangrene: Secondary | ICD-10-CM | POA: Diagnosis not present

## 2015-01-09 MED ORDER — RANITIDINE HCL 150 MG PO TABS: 150.0000 mg | ORAL_TABLET | Freq: Every day | ORAL | Status: DC

## 2015-01-09 NOTE — Progress Notes (Signed)
Patient ID: Tina Roberts, female   DOB: 02-Jul-1940, 74 y.o.   MRN: [id number] Subjective:     Tina Roberts is a 74 y.o. female and is here for a comprehensive physical exam. The patient reports problems - increased acid reflux.she stopped tagamet due to increased mouth dryness which caused sores. She is still taking protonix daily but did better with BID in the past. Needs flu shot. She feels her memory is "pretty good". No falls in last year. Denies depression  She is a poor historian due to dementia. Hx obtained from chart  Past Medical History  Diagnosis Date  . Hypertension   . Hyperlipidemia   . Osteoporosis   . Kidney stone   . Hypothyroidism   . Hepatic lesion   . History of gallstones   . Vitiligo   . GERD (gastroesophageal reflux disease)   . Vitamin D deficiency   . Myalgia and myositis, unspecified   . Osteoporosis, unspecified   . Reflux esophagitis   . Other dysphagia   . Disorder of bone and cartilage, unspecified   . Lacrimal disorder   . Conjunctivitis unspecified   . Osteopenia   . Unspecified vitamin D deficiency   . Vitiligo   . Osteoarthrosis, unspecified whether generalized or localized, unspecified site   . Pain in joint, forearm   . Unspecified disorder of kidney and ureter   . Unspecified hypothyroidism   . Other and unspecified hyperlipidemia   . Unspecified essential hypertension    Past Surgical History  Procedure Laterality Date  . Tubal ligation  1973  . Leg surgery      right   Family History  Problem Relation Age of Onset  . Colon cancer Mother 51  . Diabetes Mother   . Diabetes Sister     Social History   Social History  . Marital Status: Single    Spouse Name: N/A  . Number of Children: 4  . Years of Education: N/A   Occupational History  . Not on file.   Social History Main Topics  . Smoking status: Never Smoker   . Smokeless tobacco: Never Used  . Alcohol Use: No  . Drug Use: No  . Sexual Activity: Not on file    Other Topics Concern  . Not on file   Social History Narrative   Health Maintenance  Topic Date Due  . ZOSTAVAX  11/21/2001  . INFLUENZA VACCINE  11/03/2014  . TETANUS/TDAP  04/05/2015 (Originally 11/21/1960)  . MAMMOGRAM  08/03/2016  . COLONOSCOPY  12/24/2016  . DEXA SCAN  Completed  . PNA vac Low Risk Adult  Completed    Review of Systems   Review of Systems  Unable to perform ROS: dementia     Objective:   Filed Vitals:   01/09/15 1455  Pulse: 72  Temp: 98.3 F (36.8 C)  TempSrc: Oral  Resp: 20  Height: 5\' 2"  (1.575 m)  Weight: 113 lb 3.2 oz (51.347 kg)  SpO2: 98%       Physical Exam  Constitutional: She is well-developed, well-nourished, and in no distress.  She did not undress  HENT:  Head: Normocephalic and atraumatic.  Right Ear: External ear normal.  Left Ear: External ear normal.  Mouth/Throat: Oropharynx is clear and moist. No oropharyngeal exudate.  Eyes: Conjunctivae and EOM are normal. Pupils are equal, round, and reactive to light. No scleral icterus.  Neck: Normal range of motion. Neck supple. Carotid bruit is not present. No tracheal deviation present. No thyromegaly  present.  Cardiovascular: Normal rate, regular rhythm, normal heart sounds and intact distal pulses.  Exam reveals no gallop and no friction rub.   No murmur heard. Pulmonary/Chest: Effort normal and breath sounds normal. She has no wheezes. She has no rhonchi. She has no rales.  Abdominal: Soft. Bowel sounds are normal. She exhibits no distension and no mass. There is no hepatosplenomegaly. There is tenderness (epigastric). There is no rebound and no guarding.  Genitourinary:  Deferred. She will f/u with GYN  Musculoskeletal: Normal range of motion.  Lymphadenopathy:    She has no cervical adenopathy.  Neurological: She is alert. She has normal reflexes. Gait normal.  Skin: Skin is warm and dry. Rash (eczematous appaearing; vitilgo present) noted.  Psychiatric: Affect and  judgment normal.   VISION:  OU 20/30 with correction   MMSE - Mini Mental State Exam 01/09/2015  Orientation to time 5  Orientation to Place 5  Registration 3  Attention/ Calculation 0  Recall 3  Language- name 2 objects 2  Language- repeat 1  Language- follow 3 step command 3  Language- read & follow direction 1  Write a sentence 1  Copy design 0  Total score 24   Recent Results (from the past 2160 hour(s))  CBC with Differential     Status: None   Collection Time: 01/05/15  8:22 AM  Result Value Ref Range   WBC 3.9 3.4 - 10.8 x10E3/uL   RBC 4.80 3.77 - 5.28 x10E6/uL   Hemoglobin 13.3 11.1 - 15.9 g/dL   Hematocrit 41.9 34.0 - 46.6 %   MCV 87 79 - 97 fL   MCH 27.7 26.6 - 33.0 pg   MCHC 31.7 31.5 - 35.7 g/dL   RDW 14.3 12.3 - 15.4 %   Platelets 210 150 - 379 x10E3/uL   Neutrophils 41 %   Lymphs 43 %   Monocytes 9 %   Eos 5 %   Basos 1 %   Neutrophils Absolute 1.6 1.4 - 7.0 x10E3/uL   Lymphocytes Absolute 1.7 0.7 - 3.1 x10E3/uL   Monocytes Absolute 0.4 0.1 - 0.9 x10E3/uL   EOS (ABSOLUTE) 0.2 0.0 - 0.4 x10E3/uL   Basophils Absolute 0.0 0.0 - 0.2 x10E3/uL   Immature Granulocytes 1 %   Immature Grans (Abs) 0.0 0.0 - 0.1 x10E3/uL  CMP     Status: Abnormal   Collection Time: 01/05/15  8:22 AM  Result Value Ref Range   Glucose 84 65 - 99 mg/dL   BUN 15 8 - 27 mg/dL   Creatinine, Ser 1.07 (H) 0.57 - 1.00 mg/dL   GFR calc non Af Amer 52 (L) >59 mL/min/1.73   GFR calc Af Amer 60 >59 mL/min/1.73   BUN/Creatinine Ratio 14 11 - 26   Sodium 142 134 - 144 mmol/L    Comment: **Effective January 19, 2015 the reference interval**   for Sodium, Serum will be changing to:                                             136 - 144    Potassium 4.2 3.5 - 5.2 mmol/L    Comment: **Effective January 19, 2015 the reference interval**   for Potassium, Serum will be changing to:  0 -  7 days        3.7 - 5.2                          8 - 30 days        3.7 - 6.4                           1 -  6 months      3.8 - 6.0                   7 months -  1 year        3.8 - 5.3                              >1 year        3.5 - 5.2    Chloride 101 97 - 108 mmol/L    Comment: **Effective January 19, 2015 the reference interval**   for Chloride, Serum will be changing to:                                              97 - 106    CO2 25 18 - 29 mmol/L   Calcium 9.9 8.7 - 10.3 mg/dL   Total Protein 7.2 6.0 - 8.5 g/dL   Albumin 4.5 3.5 - 4.8 g/dL   Globulin, Total 2.7 1.5 - 4.5 g/dL   Albumin/Globulin Ratio 1.7 1.1 - 2.5   Bilirubin Total 0.5 0.0 - 1.2 mg/dL   Alkaline Phosphatase 57 39 - 117 IU/L   AST 32 0 - 40 IU/L   ALT 20 0 - 32 IU/L  Lipid Panel     Status: Abnormal   Collection Time: 01/05/15  8:22 AM  Result Value Ref Range   Cholesterol, Total 200 (H) 100 - 199 mg/dL   Triglycerides 142 0 - 149 mg/dL   HDL 68 >39 mg/dL    Comment: According to ATP-III Guidelines, HDL-C >59 mg/dL is considered a negative risk factor for CHD.    VLDL Cholesterol Cal 28 5 - 40 mg/dL   LDL Calculated 104 (H) 0 - 99 mg/dL   Chol/HDL Ratio 2.9 0.0 - 4.4 ratio units    Comment:                                   T. Chol/HDL Ratio                                             Men  Women                               1/2 Avg.Risk  3.4    3.3                                   Avg.Risk  5.0    4.4  2X Avg.Risk  9.6    7.1                                3X Avg.Risk 23.4   11.0   TSH     Status: None   Collection Time: 01/05/15  8:22 AM  Result Value Ref Range   TSH 2.020 0.450 - 4.500 uIU/mL  T4, Free     Status: None   Collection Time: 01/05/15  8:22 AM  Result Value Ref Range   Free T4 1.51 0.82 - 1.77 ng/dL  Urinalysis with Reflex Microscopic     Status: None   Collection Time: 01/05/15  8:30 AM  Result Value Ref Range   Specific Gravity, UA 1.016 1.005 - 1.030   pH, UA 5.5 5.0 - 7.5   Color, UA Yellow Yellow   Appearance Ur Clear  Clear   Leukocytes, UA Negative Negative   Protein, UA Negative Negative/Trace   Glucose, UA Negative Negative   Ketones, UA Negative Negative   RBC, UA Negative Negative   Bilirubin, UA Negative Negative   Urobilinogen, Ur 0.2 0.2 - 1.0 mg/dL   Nitrite, UA Negative Negative   Microscopic Examination Comment     Comment: Microscopic not indicated and not performed.     Assessment:    Healthy female exam.       ICD-9-CM ICD-10-CM   1. Well adult exam V70.0 Z00.00   2. Gastroesophageal reflux disease, esophagitis presence not specified - uncontrolled 530.81 K21.9 ranitidine (ZANTAC) 150 MG tablet  3. Essential hypertension, benign 401.1 O11 Basic Metabolic Panel  4. Hypothyroidism due to acquired atrophy of thyroid 244.8 E03.8 TSH   246.8 E03.4   5. Hyperlipidemia 272.4 E78.5   6. CKD (chronic kidney disease) stage 3, GFR 30-59 ml/min 585.3 N18.3   7. Sliding hiatal hernia 553.3 K44.9   8. Osteopenia 733.90 M85.80   9. Other seasonal allergic rhinitis 477.8 J30.2   10. Encounter for immunization [place]   11.    Dementia without behavioral disturbance  Plan:      Pt is UTD on health maintenance. Vaccinations are UTD. Pt maintains a healthy lifestyle. Encouraged pt to exercise 30-45 minutes 4-5 times per week. Eat a well balanced diet. Avoid smoking. Limit alcohol intake. Wear seatbelt when riding in the car. Wear sun block (SPF >50) when spending extended times outside.  Start zantac at bedtime. Continue daily protonix  Continue all other medications as ordered  Follow up in 6 mos for routine visit. Labs 2-3 days prior  Flu shot given today  Will follow dementia. Pt declines med today  F/u for colonoscopy as last one done in 2011  Idara Woodside S. Perlie Gold  [facility] [address] [place], [place] [address] [phone] Cell (Monday-Friday 8 AM - 5 PM) [phone] After 5 PM and follow prompts

## 2015-01-09 NOTE — Patient Instructions (Addendum)
Encouraged her to exercise 30-45 minutes 4-5 times per week. Eat a well balanced diet. Avoid smoking. Limit alcohol intake. Wear seatbelt when riding in the car. Wear sun block (SPF >50) when spending extended times outside.  Start zantac at bedtime. Continue daily protonix  Continue all other medications as ordered  Follow up in 6 mos for routine visit. Labs 2-3 days prior  Flu shot given today  Scheduled colonoscopy appt as you are due for one

## 2015-01-09 NOTE — Progress Notes (Signed)
Failed clock test 

## 2015-01-27 ENCOUNTER — Other Ambulatory Visit: Payer: Self-pay | Admitting: *Deleted

## 2015-01-27 DIAGNOSIS — K219 Gastro-esophageal reflux disease without esophagitis: Secondary | ICD-10-CM

## 2015-01-27 MED ORDER — RANITIDINE HCL 150 MG PO TABS: 150.0000 mg | ORAL_TABLET | Freq: Every day | ORAL | Status: DC

## 2015-01-27 MED ORDER — LEVOTHYROXINE SODIUM 75 MCG PO TABS: ORAL_TABLET | ORAL | Status: DC

## 2015-01-27 MED ORDER — PANTOPRAZOLE SODIUM 40 MG PO TBEC: 40.0000 mg | DELAYED_RELEASE_TABLET | Freq: Every day | ORAL | Status: DC

## 2015-01-27 MED ORDER — AMLODIPINE BESYLATE 5 MG PO TABS: ORAL_TABLET | ORAL | Status: DC

## 2015-01-27 NOTE — Telephone Encounter (Signed)
[facility] 

## 2015-03-26 ENCOUNTER — Encounter: Payer: Self-pay | Admitting: Nurse Practitioner

## 2015-03-26 ENCOUNTER — Ambulatory Visit (INDEPENDENT_AMBULATORY_CARE_PROVIDER_SITE_OTHER): Payer: Commercial Managed Care - HMO | Admitting: Nurse Practitioner

## 2015-03-26 VITALS — BP 116/66 | HR 76 | Temp 98.2°F | Resp 20 | Ht 62.0 in | Wt 115.0 lb

## 2015-03-26 DIAGNOSIS — B009 Herpesviral infection, unspecified: Secondary | ICD-10-CM | POA: Diagnosis not present

## 2015-03-26 MED ORDER — ACYCLOVIR 400 MG PO TABS: 400.0000 mg | ORAL_TABLET | Freq: Three times a day (TID) | ORAL | Status: DC

## 2015-03-26 NOTE — Progress Notes (Signed)
Patient ID: Tina Roberts, female   DOB: [birth year], 74 y.o.   MRN: [id number]    PCP: Gildardo Cranker, DO  Advanced Directive information Does patient have an advance directive?: No, Would patient like information on creating an advanced directive?: Yes - Educational materials given  No Known Allergies  Chief Complaint  Patient presents with  . Acute Visit     Patients c/o she has sore throat and her lip and tounge are white started about three weeks ago      HPI: Patient is a 74 y.o. female seen in the office today due to ulcer on lip. Ulcer has been on lower lip for 3 weeks. Very painful. Draining around mouth. Pins and needles around mouth.  Hurts when she eats and when she brushes her teeth Hx no history of cold sores or herpes.  Complains of dry mouth.  Review of Systems:  Review of Systems  HENT: Positive for mouth sores.   Skin: Positive for color change and wound.       Ulcer on lip    Past Medical History  Diagnosis Date  . Hypertension   . Hyperlipidemia   . Osteoporosis   . Kidney stone   . Hypothyroidism   . Hepatic lesion   . History of gallstones   . Vitiligo   . GERD (gastroesophageal reflux disease)   . Vitamin D deficiency   . Myalgia and myositis, unspecified   . Osteoporosis, unspecified   . Reflux esophagitis   . Other dysphagia   . Disorder of bone and cartilage, unspecified   . Lacrimal disorder   . Conjunctivitis unspecified   . Osteopenia   . Unspecified vitamin D deficiency   . Vitiligo   . Osteoarthrosis, unspecified whether generalized or localized, unspecified site   . Pain in joint, forearm   . Unspecified disorder of kidney and ureter   . Unspecified hypothyroidism   . Other and unspecified hyperlipidemia   . Unspecified essential hypertension    Past Surgical History  Procedure Laterality Date  . Tubal ligation  1973  . Leg surgery      right   Social History:   reports that she has never smoked. She has never used  smokeless tobacco. She reports that she does not drink alcohol or use illicit drugs.  Family History  Problem Relation Age of Onset  . Colon cancer Mother 19  . Diabetes Mother   . Diabetes Sister     Medications: Patient's Medications  New Prescriptions   No medications on file  Previous Medications   AMLODIPINE (NORVASC) 5 MG TABLET    Take one tablet by mouth once daily   CALCIUM-VITAMIN D (OSCAL 500/200 D-3) 500-200 MG-UNIT PER TABLET    Take 1 tablet by mouth 2 (two) times daily.   LEVOTHYROXINE (SYNTHROID, LEVOTHROID) 75 MCG TABLET    Take one tablet by mouth once daily for thyroid   LORATADINE (CLARITIN) 10 MG TABLET    Take 1 tablet (10 mg total) by mouth daily.   PANTOPRAZOLE (PROTONIX) 40 MG TABLET    Take 1 tablet (40 mg total) by mouth daily.   PREDNISONE (DELTASONE) 10 MG TABLET    Take 3 tabs po daily x 2 then 2 tabs po daily x 2 then 1 tab po daily x 2 and stop   ZOSTER VACCINE LIVE, PF, (ZOSTAVAX) 91478 UNT/0.65ML INJECTION    Inject 19,400 Units into the skin once.  Modified Medications   No medications on file  Discontinued Medications   METRONIDAZOLE (METROGEL) 0.75 % VAGINAL GEL    Place 1 Applicatorful vaginally at bedtime. Use for 5 days   RANITIDINE (ZANTAC) 150 MG TABLET    Take 1 tablet (150 mg total) by mouth at bedtime.     Physical Exam:  Filed Vitals:   03/26/15 1544  BP: 116/66  Pulse: 76  Temp: 98.2 F (36.8 C)  TempSrc: Oral  Resp: 20  Height: 5\' 2"  (1.575 m)  Weight: 115 lb (52.164 kg)  SpO2: 98%   Body mass index is 21.03 kg/(m^2).  Physical Exam  Constitutional: She appears well-developed and well-nourished.  HENT:  Mouth/Throat: Oral lesions (large lesion to lower lip) present.  Neurological: She is alert.  Skin: Skin is warm and dry. She is not diaphoretic.    Labs reviewed: Basic Metabolic Panel:  Recent Labs  06/03/14 1147 01/05/15 0822  NA 141 142  K 4.0 4.2  CL 99 101  CO2 24 25  GLUCOSE 87 84  BUN 18 15    CREATININE 0.98 1.07*  CALCIUM 10.0 9.9  TSH 0.558 2.020   Liver Function Tests:  Recent Labs  06/03/14 1147 01/05/15 0822  AST 28 32  ALT 18 20  ALKPHOS 47 57  BILITOT 0.3 0.5  PROT 7.4 7.2  ALBUMIN 4.3 4.5   No results for input(s): LIPASE, AMYLASE in the last 8760 hours. No results for input(s): AMMONIA in the last 8760 hours. CBC:  Recent Labs  01/05/15 0822  WBC 3.9  NEUTROABS 1.6  HCT 41.9   Lipid Panel:  Recent Labs  01/05/15 0822  CHOL 200*  HDL 68  LDLCALC 104*  TRIG 142  CHOLHDL 2.9   TSH:  Recent Labs  06/03/14 1147 01/05/15 0822  TSH 0.558 2.020   A1C: No results found for: HGBA1C   Assessment/Plan 1. Herpes simplex - acyclovir (ZOVIRAX) 400 MG tablet; Take 1 tablet (400 mg total) by mouth 3 (three) times daily.  Dispense: 21 tablet; Refill: 0 -may use abreva lip cream as needed -return precautions discussed   Persephonie Hegwood K. Harle Battiest  [facility] & [facility] [phone] 8 am - 5 pm) [phone] (after hours)

## 2015-03-26 NOTE — Patient Instructions (Signed)
To take acyclovir 400 mg three times daily for 1 week May use Abreva OTC lip cream as needed  Follow up in 1 week if no improvement or worsening   Cold Sore A cold sore (fever blister) is a skin infection caused by the herpes simplex virus (HSV-1). HSV-1 is closely related to the virus that causes genital herpes (HSV-2), but they are not the same even though both viruses can cause oral and genital infections. Cold sores are small, fluid-filled sores inside of the mouth or on the lips, gums, nose, chin, cheeks, or fingers.  The herpes simplex virus can be easily passed (contagious) to other people through close personal contact, such as kissing or sharing personal items. The virus can also spread to other parts of the body, such as the eyes or genitals. Cold sores are contagious until the sores crust over completely. They often heal within 2 weeks.  Once a person is infected, the herpes simplex virus remains permanently in the body. Therefore, there is no cure for cold sores, and they often recur when a person is tired, stressed, sick, or gets too much sun. Additional factors that can cause a recurrence include hormone changes in menstruation or pregnancy, certain drugs, and cold weather.  CAUSES  Cold sores are caused by the herpes simplex virus. The virus is spread from person to person through close contact, such as through kissing, touching the affected area, or sharing personal items such as lip balm, razors, or eating utensils.  SYMPTOMS  The first infection may not cause symptoms. If symptoms develop, the symptoms often go through different stages. Here is how a cold sore develops:   Tingling, itching, or burning is felt 1-2 days before the outbreak.   Fluid-filled blisters appear on the lips, inside the mouth, nose, or on the cheeks.   The blisters start to ooze clear fluid.   The blisters dry up and a yellow crust appears in its place.   The crust falls off.  Symptoms depend on  whether it is the initial outbreak or a recurrence. Some other symptoms with the first outbreak may include:   Fever.   Sore throat.   Headache.   Muscle aches.   Swollen neck glands.  DIAGNOSIS  A diagnosis is often made based on your symptoms and looking at the sores. Sometimes, a sore may be swabbed and then examined in the lab to make a final diagnosis. If the sores are not present, blood tests can find the herpes simplex virus.  TREATMENT  There is no cure for cold sores and no vaccine for the herpes simplex virus. Within 2 weeks, most cold sores go away on their own without treatment. Medicines cannot make the infection go away, but medicine can help relieve some of the pain associated with the sores, can work to stop the virus from multiplying, and can also shorten healing time. Medicine may be in the form of creams, gels, pills, or a shot.  HOME CARE INSTRUCTIONS   Only take over-the-counter or prescription medicines for pain, discomfort, or fever as directed by your caregiver. Do not use aspirin.   Use a cotton-tip swab to apply creams or gels to your sores.   Do not touch the sores or pick the scabs. Wash your hands often. Do not touch your eyes without washing your hands first.   Avoid kissing, oral sex, and sharing personal items until sores heal.   Apply an ice pack on your sores for 10-15 minutes to  ease any discomfort.   Avoid hot, cold, or salty foods because they may hurt your mouth. Eat a soft, bland diet to avoid irritating the sores. Use a straw to drink if you have pain when drinking out of a glass.   Keep sores clean and dry to prevent an infection of other tissues.   Avoid the sun and limit stress if these things trigger outbreaks. If sun causes cold sores, apply sunscreen on the lips before being out in the sun.  SEEK MEDICAL CARE IF:   You have a fever or persistent symptoms for more than 2-3 days.   You have a fever and your symptoms  suddenly get worse.   You have pus, not clear fluid, coming from the sores.   You have redness that is spreading.   You have pain or irritation in your eye.   You get sores on your genitals.   Your sores do not heal within 2 weeks.   You have a weakened immune system.   You have frequent recurrences of cold sores.  MAKE SURE YOU:   Understand these instructions.  Will watch your condition.  Will get help right away if you are not doing well or get worse.   This information is not intended to replace advice given to you by your health care provider. Make sure you discuss any questions you have with your health care provider.   Document Released: 03/18/2000 Document Revised: 04/11/2014 Document Reviewed: 08/03/2011 Elsevier Interactive Patient Education [employer].

## 2015-04-22 ENCOUNTER — Ambulatory Visit (INDEPENDENT_AMBULATORY_CARE_PROVIDER_SITE_OTHER): Payer: Commercial Managed Care - HMO | Admitting: Internal Medicine

## 2015-04-22 ENCOUNTER — Encounter: Payer: Self-pay | Admitting: Internal Medicine

## 2015-04-22 VITALS — BP 106/76 | HR 72 | Temp 98.2°F | Resp 20 | Ht 62.0 in | Wt 113.6 lb

## 2015-04-22 DIAGNOSIS — L309 Dermatitis, unspecified: Secondary | ICD-10-CM | POA: Diagnosis not present

## 2015-04-22 DIAGNOSIS — B009 Herpesviral infection, unspecified: Secondary | ICD-10-CM | POA: Diagnosis not present

## 2015-04-22 MED ORDER — LIDOCAINE VISCOUS 2 % MT SOLN: 15.0000 mL | OROMUCOSAL | Status: DC | PRN

## 2015-04-22 MED ORDER — VALACYCLOVIR HCL 1 G PO TABS: 1000.0000 mg | ORAL_TABLET | Freq: Two times a day (BID) | ORAL | Status: DC

## 2015-04-22 NOTE — Progress Notes (Signed)
Patient ID: Tina Roberts, female   DOB: [birth year], 75 y.o.   MRN: [id number]    Location:    [facility]   Place of Service:   OFFICE  Chief Complaint  Patient presents with  . Acute Visit    Patients c/o has sore on lip that's not healing    HPI:  75 yo female seen today for sore on her lip. She has a hx vitiligo and eczematous dermatitis. She c/o cold sore x 1 month. It appears to resolve but then becomes red and tingling. Min bleeding. She notes similar areas in distant past with cold sores. Appetite reduced due to pain in mouth. She is applying OTC lip balm with Lysine. Also tried breva She reports increased financial stressors.  Past Medical History  Diagnosis Date  . Hypertension   . Hyperlipidemia   . Osteoporosis   . Kidney stone   . Hypothyroidism   . Hepatic lesion   . History of gallstones   . Vitiligo   . GERD (gastroesophageal reflux disease)   . Vitamin D deficiency   . Myalgia and myositis, unspecified   . Osteoporosis, unspecified   . Reflux esophagitis   . Other dysphagia   . Disorder of bone and cartilage, unspecified   . Lacrimal disorder   . Conjunctivitis unspecified   . Osteopenia   . Unspecified vitamin D deficiency   . Vitiligo   . Osteoarthrosis, unspecified whether generalized or localized, unspecified site   . Pain in joint, forearm   . Unspecified disorder of kidney and ureter   . Unspecified hypothyroidism   . Other and unspecified hyperlipidemia   . Unspecified essential hypertension     Past Surgical History  Procedure Laterality Date  . Tubal ligation  1973  . Leg surgery      right    Patient Care Team: Gildardo Cranker, DO as PCP - General (Internal Medicine)  Social History   Social History  . Marital Status: Single    Spouse Name: N/A  . Number of Children: 4  . Years of Education: N/A   Occupational History  . Not on file.   Social History Main Topics  . Smoking status: Never Smoker   . Smokeless tobacco: Never Used  .  Alcohol Use: No  . Drug Use: No  . Sexual Activity: Not on file   Other Topics Concern  . Not on file   Social History Narrative     reports that she has never smoked. She has never used smokeless tobacco. She reports that she does not drink alcohol or use illicit drugs.  No Known Allergies  Medications: Patient's Medications  New Prescriptions   No medications on file  Previous Medications   AMLODIPINE (NORVASC) 5 MG TABLET    Take one tablet by mouth once daily   CALCIUM-VITAMIN D (OSCAL 500/200 D-3) 500-200 MG-UNIT PER TABLET    Take 1 tablet by mouth 2 (two) times daily.   DIGESTIVE ENZYMES (ENZYME DIGEST PO)    Take 1 tablet ( 500 mg ) by mouth at meal time  For acid reflux   LEVOTHYROXINE (SYNTHROID, LEVOTHROID) 75 MCG TABLET    Take one tablet by mouth once daily for thyroid   LORATADINE (CLARITIN) 10 MG TABLET    Take 1 tablet (10 mg total) by mouth daily.   PANTOPRAZOLE (PROTONIX) 40 MG TABLET    Take 1 tablet (40 mg total) by mouth daily.   PREDNISONE (DELTASONE) 10 MG TABLET  Take 3 tabs po daily x 2 then 2 tabs po daily x 2 then 1 tab po daily x 2 and stop   ZOSTER VACCINE LIVE, PF, (ZOSTAVAX) 09811 UNT/0.65ML INJECTION    Inject 19,400 Units into the skin once.  Modified Medications   No medications on file  Discontinued Medications   ACYCLOVIR (ZOVIRAX) 400 MG TABLET    Take 1 tablet (400 mg total) by mouth 3 (three) times daily.    Review of Systems  Constitutional: Positive for unexpected weight change.  HENT: Positive for mouth sores.   Skin: Positive for rash.  All other systems reviewed and are negative.   Filed Vitals:   04/22/15 1041  BP: 106/76  Pulse: 72  Temp: 98.2 F (36.8 C)  TempSrc: Oral  Resp: 20  Height: 5\' 2"  (1.575 m)  Weight: 113 lb 9.6 oz (51.529 kg)  SpO2: 98%   Body mass index is 20.77 kg/(m^2).  Physical Exam  Constitutional: She is oriented to person, place, and time. She appears well-developed. No distress.  HENT:  Left  lateral tongue vesicles noted with tip of tongue increased redness  Neurological: She is alert and oriented to person, place, and time.  Skin: Skin is warm and dry. Rash (vesicular rash with red base on lower internal lip with swelling. min bleeding. eczematous rash generalized) noted.  Vitiligo present  Psychiatric: She has a normal mood and affect. Her behavior is normal. Thought content normal.     Labs reviewed: No visits with results within 3 Month(s) from this visit. Latest known visit with results is:  Appointment on 01/05/2015  Component Date Value Ref Range Status  . WBC 01/05/2015 3.9  3.4 - 10.8 x10E3/uL Final  . RBC 01/05/2015 4.80  3.77 - 5.28 x10E6/uL Final  . Hemoglobin 01/05/2015 13.3  11.1 - 15.9 g/dL Final  . Hematocrit 01/05/2015 41.9  34.0 - 46.6 % Final  . MCV 01/05/2015 87  79 - 97 fL Final  . MCH 01/05/2015 27.7  26.6 - 33.0 pg Final  . MCHC 01/05/2015 31.7  31.5 - 35.7 g/dL Final  . RDW 01/05/2015 14.3  12.3 - 15.4 % Final  . Platelets 01/05/2015 210  150 - 379 x10E3/uL Final  . Neutrophils 01/05/2015 41   Final  . Lymphs 01/05/2015 43   Final  . Monocytes 01/05/2015 9   Final  . Eos 01/05/2015 5   Final  . Basos 01/05/2015 1   Final  . Neutrophils Absolute 01/05/2015 1.6  1.4 - 7.0 x10E3/uL Final  . Lymphocytes Absolute 01/05/2015 1.7  0.7 - 3.1 x10E3/uL Final  . Monocytes Absolute 01/05/2015 0.4  0.1 - 0.9 x10E3/uL Final  . EOS (ABSOLUTE) 01/05/2015 0.2  0.0 - 0.4 x10E3/uL Final  . Basophils Absolute 01/05/2015 0.0  0.0 - 0.2 x10E3/uL Final  . Immature Granulocytes 01/05/2015 1   Final  . Immature Grans (Abs) 01/05/2015 0.0  0.0 - 0.1 x10E3/uL Final  . Glucose 01/05/2015 84  65 - 99 mg/dL Final  . BUN 01/05/2015 15  8 - 27 mg/dL Final  . Creatinine, Ser 01/05/2015 1.07* 0.57 - 1.00 mg/dL Final  . GFR calc non Af Amer 01/05/2015 52* >59 mL/min/1.73 Final  . GFR calc Af Amer 01/05/2015 60  >59 mL/min/1.73 Final  . BUN/Creatinine Ratio 01/05/2015 14  11  - 26 Final  . Sodium 01/05/2015 142  134 - 144 mmol/L Final   Comment: **Effective January 19, 2015 the reference interval**   for Sodium, Serum will  be changing to:                                             136 - 144   . Potassium 01/05/2015 4.2  3.5 - 5.2 mmol/L Final   Comment: **Effective January 19, 2015 the reference interval**   for Potassium, Serum will be changing to:                          0 -  7 days        3.7 - 5.2                          8 - 30 days        3.7 - 6.4                          1 -  6 months      3.8 - 6.0                   7 months -  1 year        3.8 - 5.3                              >1 year        3.5 - 5.2   . Chloride 01/05/2015 101  97 - 108 mmol/L Final   Comment: **Effective January 19, 2015 the reference interval**   for Chloride, Serum will be changing to:                                              97 - 106   . CO2 01/05/2015 25  18 - 29 mmol/L Final  . Calcium 01/05/2015 9.9  8.7 - 10.3 mg/dL Final  . Total Protein 01/05/2015 7.2  6.0 - 8.5 g/dL Final  . Albumin 01/05/2015 4.5  3.5 - 4.8 g/dL Final  . Globulin, Total 01/05/2015 2.7  1.5 - 4.5 g/dL Final  . Albumin/Globulin Ratio 01/05/2015 1.7  1.1 - 2.5 Final  . Bilirubin Total 01/05/2015 0.5  0.0 - 1.2 mg/dL Final  . Alkaline Phosphatase 01/05/2015 57  39 - 117 IU/L Final  . AST 01/05/2015 32  0 - 40 IU/L Final  . ALT 01/05/2015 20  0 - 32 IU/L Final  . Cholesterol, Total 01/05/2015 200* 100 - 199 mg/dL Final  . Triglycerides 01/05/2015 142  0 - 149 mg/dL Final  . HDL 01/05/2015 68  >39 mg/dL Final   Comment: According to ATP-III Guidelines, HDL-C >59 mg/dL is considered a negative risk factor for CHD.   Marland Kitchen VLDL Cholesterol Cal 01/05/2015 28  5 - 40 mg/dL Final  . LDL Calculated 01/05/2015 104* 0 - 99 mg/dL Final  . Chol/HDL Ratio 01/05/2015 2.9  0.0 - 4.4 ratio units Final   Comment:                                   T. Chol/HDL Ratio  Men  Women                               1/2 Avg.Risk  3.4    3.3                                   Avg.Risk  5.0    4.4                                2X Avg.Risk  9.6    7.1                                3X Avg.Risk 23.4   11.0   . TSH 01/05/2015 2.020  0.450 - 4.500 uIU/mL Final  . Free T4 01/05/2015 1.51  0.82 - 1.77 ng/dL Final  . Specific Gravity, UA 01/05/2015 1.016  1.005 - 1.030 Final  . pH, UA 01/05/2015 5.5  5.0 - 7.5 Final  . Color, UA 01/05/2015 Yellow  Yellow Final  . Appearance Ur 01/05/2015 Clear  Clear Final  . Leukocytes, UA 01/05/2015 Negative  Negative Final  . Protein, UA 01/05/2015 Negative  Negative/Trace Final  . Glucose, UA 01/05/2015 Negative  Negative Final  . Ketones, UA 01/05/2015 Negative  Negative Final  . RBC, UA 01/05/2015 Negative  Negative Final  . Bilirubin, UA 01/05/2015 Negative  Negative Final  . Urobilinogen, Ur 01/05/2015 0.2  0.2 - 1.0 mg/dL Final  . Nitrite, UA 01/05/2015 Negative  Negative Final  . Microscopic Examination 01/05/2015 Comment   Final   Microscopic not indicated and not performed.    No results found.   Assessment/Plan   ICD-9-CM ICD-10-CM   1. Herpes simplex 054.9 B00.9 valACYclovir (VALTREX) 1000 MG tablet     lidocaine (XYLOCAINE) 2 % solution  2. Eczematous dermatitis 692.9 L30.9     Take valtrex 1 tablet by mouth 2 times daily x 10 days  May use abreva as directed  May need dermatology eval if it does not resolve after therapy  Continue other medications as ordered  Continue moisturizing lotion to skin  Follow up as scheduled  Taniya Dasher S. Perlie Gold  [facility] [address] [place], [place] [address] [phone] Cell (Monday-Friday 8 AM - 5 PM) [phone] After 5 PM and follow prompts

## 2015-04-22 NOTE — Patient Instructions (Signed)
Take valtrex 1 tablet by mouth 2 times daily x 10 days  May use abreva as directed  May need dermatology eval if it does not resolve after therapy  Continue other medications as ordered  Follow up as scheduled

## 2015-04-30 ENCOUNTER — Telehealth: Payer: Self-pay

## 2015-04-30 DIAGNOSIS — L309 Dermatitis, unspecified: Secondary | ICD-10-CM

## 2015-04-30 DIAGNOSIS — B009 Herpesviral infection, unspecified: Secondary | ICD-10-CM

## 2015-04-30 NOTE — Telephone Encounter (Signed)
Ok to refer to dermatology [facility] dermatology)  for herpes simplex rash and eczematous dermatitis

## 2015-04-30 NOTE — Telephone Encounter (Signed)
Patient called and stated that she would needed a referral to [facility] and [place], [phone]. Patient stated that need for dermatology was discussed at last OV.

## 2015-05-01 NOTE — Telephone Encounter (Signed)
Referral order placed. Patient was called and notified that referral was placed.

## 2015-06-15 ENCOUNTER — Other Ambulatory Visit: Payer: Self-pay | Admitting: Internal Medicine

## 2015-07-08 ENCOUNTER — Other Ambulatory Visit: Payer: Commercial Managed Care - HMO

## 2015-07-08 DIAGNOSIS — I10 Essential (primary) hypertension: Secondary | ICD-10-CM

## 2015-07-08 DIAGNOSIS — E034 Atrophy of thyroid (acquired): Secondary | ICD-10-CM

## 2015-07-09 LAB — BASIC METABOLIC PANEL
BUN/Creatinine Ratio: 9 — ABNORMAL LOW (ref 12–28)
BUN: 9 mg/dL (ref 8–27)
CO2: 25 mmol/L (ref 18–29)
Calcium: 9.6 mg/dL (ref 8.7–10.3)
Chloride: 102 mmol/L (ref 96–106)
Creatinine, Ser: 1.01 mg/dL — ABNORMAL HIGH (ref 0.57–1.00)
GFR calc Af Amer: 64 mL/min/{1.73_m2} (ref >59)
GFR calc non Af Amer: 55 mL/min/{1.73_m2} — ABNORMAL LOW (ref >59)
Glucose: 94 mg/dL (ref 65–99)
Potassium: 3.7 mmol/L (ref 3.5–5.2)
Sodium: 144 mmol/L (ref 134–144)

## 2015-07-09 LAB — TSH: TSH: 2.08 u[IU]/mL (ref 0.450–4.500)

## 2015-07-10 ENCOUNTER — Encounter: Payer: Self-pay | Admitting: Internal Medicine

## 2015-07-10 ENCOUNTER — Ambulatory Visit (INDEPENDENT_AMBULATORY_CARE_PROVIDER_SITE_OTHER): Payer: Commercial Managed Care - HMO | Admitting: Internal Medicine

## 2015-07-10 VITALS — BP 138/78 | HR 75 | Temp 98.2°F | Resp 20 | Ht 62.0 in | Wt 117.8 lb

## 2015-07-10 DIAGNOSIS — K219 Gastro-esophageal reflux disease without esophagitis: Secondary | ICD-10-CM

## 2015-07-10 DIAGNOSIS — E038 Other specified hypothyroidism: Secondary | ICD-10-CM

## 2015-07-10 DIAGNOSIS — J302 Other seasonal allergic rhinitis: Secondary | ICD-10-CM | POA: Diagnosis not present

## 2015-07-10 DIAGNOSIS — E034 Atrophy of thyroid (acquired): Secondary | ICD-10-CM

## 2015-07-10 DIAGNOSIS — E785 Hyperlipidemia, unspecified: Secondary | ICD-10-CM | POA: Diagnosis not present

## 2015-07-10 DIAGNOSIS — K148 Other diseases of tongue: Secondary | ICD-10-CM | POA: Diagnosis not present

## 2015-07-10 DIAGNOSIS — I10 Essential (primary) hypertension: Secondary | ICD-10-CM | POA: Diagnosis not present

## 2015-07-10 DIAGNOSIS — B009 Herpesviral infection, unspecified: Secondary | ICD-10-CM | POA: Diagnosis not present

## 2015-07-10 MED ORDER — VALACYCLOVIR HCL 500 MG PO TABS: 500.0000 mg | ORAL_TABLET | Freq: Every day | ORAL | Status: DC

## 2015-07-10 NOTE — Progress Notes (Signed)
Patient ID: Tina Roberts, female   DOB: 11/15/1940, 75 y.o.   MRN: [id number]    Location:   [facility]    Place of Service:   OFFICE  Chief Complaint  Patient presents with  . Medical Management of Chronic Issues    6 mo f/u- still having tingling in her lower lip    HPI:  75 yo female seen today for f/u. She continues to have intermittent numbness and burning at tip of tongue. She has completed valtrex. HSV infection resolved. No difficulty chewing, tasting or swallowing  She has hx elevated cholesterol but controls it with diet and exercise  She is taking calcium w D for osteoporosis. Fosamax stopped several mos ago due to ADRs. Never tried prolia injections  BP stable on amlodipine  She has GERD/HH and takes digestive enzymes and protonix. Sx's controlled most times  Hypothyroid - stable on levothyroxine. TSH 2.080  Past Medical History  Diagnosis Date  . Hypertension   . Hyperlipidemia   . Osteoporosis   . Kidney stone   . Hypothyroidism   . Hepatic lesion   . History of gallstones   . Vitiligo   . GERD (gastroesophageal reflux disease)   . Vitamin D deficiency   . Myalgia and myositis, unspecified   . Osteoporosis, unspecified   . Reflux esophagitis   . Other dysphagia   . Disorder of bone and cartilage, unspecified   . Lacrimal disorder   . Conjunctivitis unspecified   . Osteopenia   . Unspecified vitamin D deficiency   . Vitiligo   . Osteoarthrosis, unspecified whether generalized or localized, unspecified site   . Pain in joint, forearm   . Unspecified disorder of kidney and ureter   . Unspecified hypothyroidism   . Other and unspecified hyperlipidemia   . Unspecified essential hypertension     Past Surgical History  Procedure Laterality Date  . Tubal ligation  1973  . Leg surgery      right    Patient Care Team: Gildardo Cranker, DO as PCP - General (Internal Medicine)  Social History   Social History  . Marital Status: Single    Spouse Name: N/A   . Number of Children: 4  . Years of Education: N/A   Occupational History  . Not on file.   Social History Main Topics  . Smoking status: Never Smoker   . Smokeless tobacco: Never Used  . Alcohol Use: No  . Drug Use: No  . Sexual Activity: Not on file   Other Topics Concern  . Not on file   Social History Narrative     reports that she has never smoked. She has never used smokeless tobacco. She reports that she does not drink alcohol or use illicit drugs.  No Known Allergies  Medications: Patient's Medications  New Prescriptions   No medications on file  Previous Medications   AMLODIPINE (NORVASC) 5 MG TABLET    Take one tablet by mouth once daily   CALCIUM-VITAMIN D (OSCAL 500/200 D-3) 500-200 MG-UNIT PER TABLET    Take 1 tablet by mouth 2 (two) times daily.   DIGESTIVE ENZYMES (ENZYME DIGEST PO)    Take 1 tablet ( 500 mg ) by mouth at meal time  For acid reflux   LEVOTHYROXINE (SYNTHROID, LEVOTHROID) 75 MCG TABLET    Take one tablet by mouth once daily for thyroid   LIDOCAINE (XYLOCAINE) 2 % SOLUTION    Use as directed 15 mLs in the mouth or throat as  needed for mouth pain.   LORATADINE (CLARITIN) 10 MG TABLET    Take 1 tablet (10 mg total) by mouth daily.   PANTOPRAZOLE (PROTONIX) 40 MG TABLET    Take 1 tablet (40 mg total) by mouth daily.   ZOSTER VACCINE LIVE, PF, (ZOSTAVAX) 29562 UNT/0.65ML INJECTION    Inject 19,400 Units into the skin once.  Modified Medications   No medications on file  Discontinued Medications   PREDNISONE (DELTASONE) 10 MG TABLET    Take 3 tabs po daily x 2 then 2 tabs po daily x 2 then 1 tab po daily x 2 and stop   VALACYCLOVIR (VALTREX) 1000 MG TABLET    TAKE 1 TABLET (1,000 MG TOTAL) BY MOUTH 2 (TWO) TIMES DAILY.    Review of Systems  HENT: Positive for mouth sores.   Neurological: Positive for numbness.  All other systems reviewed and are negative.   Filed Vitals:   07/10/15 1137  BP: 138/78  Pulse: 75  Temp: 98.2 F (36.8 C)    TempSrc: Oral  Resp: 20  Height: 5\' 2"  (1.575 m)  Weight: 117 lb 12.8 oz (53.434 kg)  SpO2: 97%   Body mass index is 21.54 kg/(m^2).  Physical Exam  Constitutional: She is oriented to person, place, and time. She appears well-developed.  Frail appearing   HENT:  Mouth/Throat: Oropharynx is clear and moist.  L>R distal tongue redness and change in mucosa. No ulceration. No masses seen. Poor dentition but no signs of secondary infection  Eyes: Pupils are equal, round, and reactive to light. No scleral icterus.  Neck: Neck supple. Carotid bruit is not present. No tracheal deviation present. No thyromegaly present.  Cardiovascular: Normal rate, regular rhythm, normal heart sounds and intact distal pulses.  Exam reveals no gallop and no friction rub.   No murmur heard. No LE edema b/l. no calf TTP.   Pulmonary/Chest: Effort normal and breath sounds normal. No stridor. No respiratory distress. She has no wheezes. She has no rales.  Abdominal: Soft. Bowel sounds are normal. She exhibits no distension and no mass. There is no hepatomegaly. There is no tenderness. There is no rebound and no guarding.  Musculoskeletal: She exhibits edema (right knee swelling).  Lymphadenopathy:       Head (right side): No submental and no submandibular adenopathy present.       Head (left side): No submental and no submandibular adenopathy present.    She has no cervical adenopathy.  Neurological: She is alert and oriented to person, place, and time.  Skin: Skin is warm and dry. Rash (vitiligo) noted.  Psychiatric: She has a normal mood and affect. Her behavior is normal. Thought content normal.     Labs reviewed: Appointment on 07/08/2015  Component Date Value Ref Range Status  . Glucose 07/08/2015 94  65 - 99 mg/dL Final  . BUN 07/08/2015 9  8 - 27 mg/dL Final  . Creatinine, Ser 07/08/2015 1.01* 0.57 - 1.00 mg/dL Final  . GFR calc non Af Amer 07/08/2015 55* >59 mL/min/1.73 Final  . GFR calc Af Amer  07/08/2015 64  >59 mL/min/1.73 Final  . BUN/Creatinine Ratio 07/08/2015 9* 12 - 28 Final                 **Please note reference interval change**  . Sodium 07/08/2015 144  134 - 144 mmol/L Final  . Potassium 07/08/2015 3.7  3.5 - 5.2 mmol/L Final  . Chloride 07/08/2015 102  96 - 106 mmol/L Final  .  CO2 07/08/2015 25  18 - 29 mmol/L Final  . Calcium 07/08/2015 9.6  8.7 - 10.3 mg/dL Final  . TSH 07/08/2015 2.080  0.450 - 4.500 uIU/mL Final    No results found.   Assessment/Plan   ICD-9-CM ICD-10-CM   1. Tongue lesion 529.8 K14.8 Ambulatory referral to ENT  2. Essential hypertension, benign 401.1 I10   3. Gastroesophageal reflux disease, esophagitis presence not specified 530.81 K21.9   4. Herpes simplex virus infection - nongenital; recurrent 054.9 B00.9 valACYclovir (VALTREX) 500 MG tablet  5. Other seasonal allergic rhinitis 477.8 J30.2   6. Hypothyroidism due to acquired atrophy of thyroid 244.8 E03.8    246.8 E03.4   7. Hyperlipidemia 272.4 E78.5     Start valtrex daily to suppress frequency of cold sores  Will call with ENT referral  Continue current medications as ordered  [facility] for right knee brace. She will contact DME supplier and have them to fax order  Follow up in 6 mos for CPE/MMSE. Fasting labs prior to appt  [place] S. Perlie Gold  [facility] [address] [place], [place] [address] [phone] Cell (Monday-Friday 8 AM - 5 PM) [phone] After 5 PM and follow prompts

## 2015-07-10 NOTE — Patient Instructions (Signed)
Start valtrex daily to suppress frequency of cold sores  Will call with ENT referral  Continue current medications as ordered  Follow up in 6 mos for CPE/MMSE. Fasting labs prior to appt

## 2015-08-04 ENCOUNTER — Telehealth: Payer: Self-pay

## 2015-08-04 DIAGNOSIS — B009 Herpesviral infection, unspecified: Secondary | ICD-10-CM

## 2015-08-04 MED ORDER — VALACYCLOVIR HCL 500 MG PO TABS: 500.0000 mg | ORAL_TABLET | Freq: Every day | ORAL | Status: DC

## 2015-08-04 NOTE — Telephone Encounter (Signed)
RX sent per patient request, mail order company would not fill because patient does not have a credit card

## 2015-08-29 ENCOUNTER — Emergency Department (HOSPITAL_COMMUNITY)
Admission: EM | Admit: 2015-08-29 | Discharge: 2015-08-29 | Disposition: A | Discharge status: Home / Self Care | Payer: Commercial Managed Care - HMO | Attending: Emergency Medicine | Admitting: Emergency Medicine

## 2015-08-29 ENCOUNTER — Encounter (HOSPITAL_COMMUNITY): Payer: Self-pay | Admitting: *Deleted

## 2015-08-29 DIAGNOSIS — I10 Essential (primary) hypertension: Secondary | ICD-10-CM | POA: Diagnosis not present

## 2015-08-29 DIAGNOSIS — Z79899 Other long term (current) drug therapy: Secondary | ICD-10-CM | POA: Insufficient documentation

## 2015-08-29 DIAGNOSIS — M436 Torticollis: Secondary | ICD-10-CM | POA: Insufficient documentation

## 2015-08-29 DIAGNOSIS — M542 Cervicalgia: Secondary | ICD-10-CM | POA: Diagnosis present

## 2015-08-29 DIAGNOSIS — Z791 Long term (current) use of non-steroidal anti-inflammatories (NSAID): Secondary | ICD-10-CM | POA: Insufficient documentation

## 2015-08-29 DIAGNOSIS — E785 Hyperlipidemia, unspecified: Secondary | ICD-10-CM | POA: Insufficient documentation

## 2015-08-29 MED ORDER — TIZANIDINE HCL 2 MG PO TABS: 1.0000 mg | ORAL_TABLET | Freq: Three times a day (TID) | ORAL | Status: DC | PRN

## 2015-08-29 MED ORDER — METHOCARBAMOL 500 MG PO TABS
250.0000 mg | ORAL_TABLET | Freq: Once | ORAL | Status: AC
  Administered 2015-08-29: 250 mg via ORAL
  Filled 2015-08-29: qty 1

## 2015-08-29 MED ORDER — DICLOFENAC SODIUM 1 % TD GEL: 2.0000 g | Freq: Four times a day (QID) | TRANSDERMAL | Status: DC

## 2015-08-29 NOTE — ED Notes (Signed)
C/o neck pain onset Wed stes she was fine when she went to bed woke up with pain. Denies injury

## 2015-08-29 NOTE — ED Provider Notes (Signed)
CSN: [id number]     Arrival date & time 08/29/15  [id number] History   First MD Initiated Contact with Patient 08/29/15 [phone]     Chief Complaint  Patient presents with  . Neck Pain     Patient is a 75 y.o. female presenting with neck pain. The history is provided by the patient. No language interpreter was used.  Neck Pain  Tina Roberts is a 75 y.o. female who presents to the Emergency Department complaining of neck pain.  5 days ago she awoke with a right posterior neck ache. The pain is worse with bending over and turning her neck to the side. She has no known injury. There are no associated symptoms. She denies any headache, sore throat, chest pain, shortness of breath, fever, numbness, weakness. Symptoms are mild to moderate and constant in nature.  Past Medical History  Diagnosis Date  . Hypertension   . Hyperlipidemia   . Osteoporosis   . Kidney stone   . Hypothyroidism   . Hepatic lesion   . History of gallstones   . Vitiligo   . GERD (gastroesophageal reflux disease)   . Vitamin D deficiency   . Myalgia and myositis, unspecified   . Osteoporosis, unspecified   . Reflux esophagitis   . Other dysphagia   . Disorder of bone and cartilage, unspecified   . Lacrimal disorder   . Conjunctivitis unspecified   . Osteopenia   . Unspecified vitamin D deficiency   . Vitiligo   . Osteoarthrosis, unspecified whether generalized or localized, unspecified site   . Pain in joint, forearm   . Unspecified disorder of kidney and ureter   . Unspecified hypothyroidism   . Other and unspecified hyperlipidemia   . Unspecified essential hypertension    Past Surgical History  Procedure Laterality Date  . Tubal ligation  1973  . Leg surgery      right   Family History  Problem Relation Age of Onset  . Colon cancer Mother 53  . Diabetes Mother   . Diabetes Sister    Social History  Substance Use Topics  . Smoking status: Never Smoker   . Smokeless tobacco: Never Used  . Alcohol Use:  No   OB History    No data available     Review of Systems  Musculoskeletal: Positive for neck pain.  All other systems reviewed and are negative.     Allergies  Review of patient's allergies indicates no known allergies.  Home Medications   Prior to Admission medications   Medication Sig Start Date End Date Taking? Authorizing Provider  amLODipine (NORVASC) 5 MG tablet Take one tablet by mouth once daily 01/27/15   Estill Dooms, MD  calcium-vitamin D (OSCAL 500/200 D-3) 500-200 MG-UNIT per tablet Take 1 tablet by mouth 2 (two) times daily. Patient taking differently: Take 1 tablet by mouth daily.  06/03/14   Blanchie Serve, MD  diclofenac sodium (VOLTAREN) 1 % GEL Apply 2 g topically 4 (four) times daily. 08/29/15   Quintella Reichert, MD  Digestive Enzymes (ENZYME DIGEST PO) Take 1 tablet ( 500 mg ) by mouth at meal time  For acid reflux    Historical Provider, MD  levothyroxine (SYNTHROID, LEVOTHROID) 75 MCG tablet Take one tablet by mouth once daily for thyroid 01/27/15   Estill Dooms, MD  lidocaine (XYLOCAINE) 2 % solution Use as directed 15 mLs in the mouth or throat as needed for mouth pain. 04/22/15   Gildardo Cranker, DO  loratadine (  CLARITIN) 10 MG tablet Take 1 tablet (10 mg total) by mouth daily. 09/03/14   Gildardo Cranker, DO  pantoprazole (PROTONIX) 40 MG tablet Take 1 tablet (40 mg total) by mouth daily. 01/27/15   Estill Dooms, MD  tiZANidine (ZANAFLEX) 2 MG tablet Take 0.5-1 tablets (1-2 mg total) by mouth every 8 (eight) hours as needed for muscle spasms. 08/29/15   Quintella Reichert, MD  valACYclovir (VALTREX) 500 MG tablet Take 1 tablet (500 mg total) by mouth daily. 08/04/15   Gildardo Cranker, DO  zoster vaccine live, PF, (ZOSTAVAX) 32440 UNT/0.65ML injection Inject 19,400 Units into the skin once. Patient not taking: Reported on 07/10/2015 06/03/14   Mahima Bubba Camp, MD   BP 139/96 mmHg  Pulse 90  Temp(Src) 98.6 F (37 C) (Oral)  Resp 14  Ht 5\' 2"  (1.575 m)  Wt 114 lb (51.71 kg)   BMI 20.85 kg/m2  SpO2 99% Physical Exam  Constitutional: She is oriented to person, place, and time. She appears well-developed and well-nourished.  HENT:  Head: Normocephalic and atraumatic.  Neck:  There is no C or T-spine tenderness to palpation. There is pain on lateral rotation of the neck to the right and to the left. No significant muscle spasm on examination or significant muscular tenderness on exam  Cardiovascular: Normal rate and regular rhythm.   No murmur heard. Pulmonary/Chest: Effort normal and breath sounds normal. No respiratory distress.  Abdominal: Soft. There is no tenderness. There is no rebound and no guarding.  Musculoskeletal: She exhibits no edema or tenderness.  2+ radial pulses bilaterally  Neurological: She is alert and oriented to person, place, and time.  5 out of 5 grip strength in bilateral upper extremities. 5 out of 5 strength in all 4 extremities. Sensation light touch intact in all 4 extremities  Skin: Skin is warm and dry.  Psychiatric: She has a normal mood and affect. Her behavior is normal.  Nursing note and vitals reviewed.   ED Course  Procedures (including critical care time) Labs Review Labs Reviewed - No data to display  Imaging Review No results found. I have personally reviewed and evaluated these images and lab results as part of my medical decision-making.   EKG Interpretation None      MDM   Final diagnoses:  Torticollis, acute  Patient here for evaluation of nontraumatic posterior neck pain. She is neurovascularly intact on examination. She does have pain with lateral rotation of the neck. Presentation is consistent with torticollis. There is no evidence of acute fracture, infectious process, nerve impingiment on examination. Discussed home care, outpatient follow-up, reassured precautions.  Quintella Reichert, MD 08/30/15 [phone]

## 2015-08-29 NOTE — Discharge Instructions (Signed)
Please be very careful when taking the muscle relaxant as this can make you weak and at risk for falls. Have someone assist you around the house when you take the medicine. You can apply a warm compress to your neck for pain. Get rechecked immediately if you have any new or worrisome symptoms.   Acute Torticollis Torticollis is a condition in which the muscles of the neck tighten (contract) abnormally, causing the neck to twist and the head to move into an unnatural position. Torticollis that develops suddenly is called acute torticollis. If torticollis becomes chronic and is left untreated, the face and neck can become deformed. CAUSES This condition may be caused by:  Sleeping in an awkward position (common).  Extending or twisting the neck muscles beyond their normal position.  Infection. In some cases, the cause may not be known. SYMPTOMS Symptoms of this condition include:  An unnatural position of the head.  Neck pain.  A limited ability to move the neck.  Twisting of the neck to one side. DIAGNOSIS This condition is diagnosed with a physical exam. You may also have imaging tests, such as an X-ray, CT scan, or MRI. TREATMENT Treatment for this condition involves trying to relax the neck muscles. It may include:  Medicines or shots.  Physical therapy.  Surgery. This may be done in severe cases. HOME CARE INSTRUCTIONS  Take medicines only as directed by your health care provider.  Do stretching exercises and massage your neck as directed by your health care provider.  Keep all follow-up visits as directed by your health care provider. This is important. SEEK MEDICAL CARE IF:  You develop a fever. SEEK IMMEDIATE MEDICAL CARE IF:  You develop difficulty breathing.  You develop noisy breathing (stridor).  You start drooling.  You have trouble swallowing or have pain with swallowing.  You develop numbness or weakness in your hands or feet.  You have changes in  your speech, understanding, or vision.  Your pain gets worse.   This information is not intended to replace advice given to you by your health care provider. Make sure you discuss any questions you have with your health care provider.   Document Released: 03/18/2000 Document Revised: 08/05/2014 Document Reviewed: 03/17/2014 Elsevier Interactive Patient Education [employer].

## 2015-09-04 ENCOUNTER — Encounter: Payer: Self-pay | Admitting: Internal Medicine

## 2015-09-04 ENCOUNTER — Ambulatory Visit (INDEPENDENT_AMBULATORY_CARE_PROVIDER_SITE_OTHER): Payer: Commercial Managed Care - HMO | Admitting: Internal Medicine

## 2015-09-04 VITALS — BP 102/78 | HR 76 | Temp 97.9°F | Ht 62.0 in | Wt 113.8 lb

## 2015-09-04 DIAGNOSIS — M6248 Contracture of muscle, other site: Secondary | ICD-10-CM

## 2015-09-04 DIAGNOSIS — M62838 Other muscle spasm: Secondary | ICD-10-CM

## 2015-09-04 DIAGNOSIS — M542 Cervicalgia: Secondary | ICD-10-CM | POA: Diagnosis not present

## 2015-09-04 MED ORDER — TETANUS-DIPHTH-ACELL PERTUSSIS 5-2.5-18.5 LF-MCG/0.5 IM SUSP: 0.5000 mL | Freq: Once | INTRAMUSCULAR | Status: DC

## 2015-09-04 MED ORDER — ZOSTER VACCINE LIVE 19400 UNT/0.65ML ~~LOC~~ SUSR: 0.6500 mL | Freq: Once | SUBCUTANEOUS | Status: DC

## 2015-09-04 MED ORDER — TIZANIDINE HCL 2 MG PO TABS: 1.0000 mg | ORAL_TABLET | Freq: Three times a day (TID) | ORAL | Status: DC | PRN

## 2015-09-04 NOTE — Patient Instructions (Signed)
Use muscle relaxer as needed for muscle spasms  Continue voltaren gel for pain  Continue other medications as ordered  Follow up as scheduled or before then if symptoms persist

## 2015-09-04 NOTE — Progress Notes (Signed)
Patient ID: Tina Roberts, female   DOB: 1941-03-01, 75 y.o.   MRN: [id number]    Location:  [facility] Place of Service: OFFICE  Chief Complaint  Patient presents with  . Follow-up    follow up for ED    HPI:  75 yo female seen today for ED f/u. She presented to the ER on 5/27th with acute neck pain not relieved with topical agents. No imaging performed. She was Rx zanaflex which helps. She is also applying voltaren gel which helps. Feels a little tight at base but pain improved. No known trauma.   She continues to have intermittent numbness and burning at tip of tongue. She has completed valtrex. HSV infection resolved. No difficulty chewing, tasting or swallowing   Past Medical History  Diagnosis Date  . Hypertension   . Hyperlipidemia   . Osteoporosis   . Kidney stone   . Hypothyroidism   . Hepatic lesion   . History of gallstones   . Vitiligo   . GERD (gastroesophageal reflux disease)   . Vitamin D deficiency   . Myalgia and myositis, unspecified   . Osteoporosis, unspecified   . Reflux esophagitis   . Other dysphagia   . Disorder of bone and cartilage, unspecified   . Lacrimal disorder   . Conjunctivitis unspecified   . Osteopenia   . Unspecified vitamin D deficiency   . Vitiligo   . Osteoarthrosis, unspecified whether generalized or localized, unspecified site   . Pain in joint, forearm   . Unspecified disorder of kidney and ureter   . Unspecified hypothyroidism   . Other and unspecified hyperlipidemia   . Unspecified essential hypertension     Past Surgical History  Procedure Laterality Date  . Tubal ligation  1973  . Leg surgery      right    Patient Care Team: Gildardo Cranker, DO as PCP - General (Internal Medicine)  Social History   Social History  . Marital Status: Single    Spouse Name: N/A  . Number of Children: 4  . Years of Education: N/A   Occupational History  . Not on file.   Social History Main Topics  . Smoking status: Never Smoker     . Smokeless tobacco: Never Used  . Alcohol Use: No  . Drug Use: No  . Sexual Activity: Not on file   Other Topics Concern  . Not on file   Social History Narrative     reports that she has never smoked. She has never used smokeless tobacco. She reports that she does not drink alcohol or use illicit drugs.  Family History  Problem Relation Age of Onset  . Colon cancer Mother 29  . Diabetes Mother   . Diabetes Sister    Family Status  Relation Status Death Age  . Mother Deceased   . Sister Alive   . Father Deceased   . Brother Alive   . Son Alive   . Brother Alive   . Brother Alive   . Son Alive   . Son Alive   . Son Alive      No Known Allergies  Medications: Patient's Medications  New Prescriptions   ZOSTER VACCINE LIVE, PF, (ZOSTAVAX) 16109 UNT/0.65ML INJECTION    Inject 19,400 Units into the skin once.  Previous Medications   AMLODIPINE (NORVASC) 5 MG TABLET    Take one tablet by mouth once daily   CALCIUM-VITAMIN D (OSCAL WITH D) 500-200 MG-UNIT TABLET    Take 1 tablet  by mouth daily with breakfast.   DICLOFENAC SODIUM (VOLTAREN) 1 % GEL    Apply 2 g topically 4 (four) times daily.   DIGESTIVE ENZYMES (ENZYME DIGEST PO)    Take 1 tablet ( 500 mg ) by mouth at meal time  For acid reflux   LEVOTHYROXINE (SYNTHROID, LEVOTHROID) 75 MCG TABLET    Take one tablet by mouth once daily for thyroid   LORATADINE (CLARITIN) 10 MG TABLET    Take 1 tablet (10 mg total) by mouth daily.   PANTOPRAZOLE (PROTONIX) 40 MG TABLET    Take 1 tablet (40 mg total) by mouth daily.   TIZANIDINE (ZANAFLEX) 2 MG TABLET    Take 0.5-1 tablets (1-2 mg total) by mouth every 8 (eight) hours as needed for muscle spasms.   VALACYCLOVIR (VALTREX) 500 MG TABLET    Take 1 tablet (500 mg total) by mouth daily.  Modified Medications   Modified Medication Previous Medication   TDAP (BOOSTRIX) 5-2.5-18.5 LF-MCG/0.5 INJECTION Tdap (BOOSTRIX) 5-2.5-18.5 LF-MCG/0.5 injection      Inject 0.5 mLs into the  muscle once.    Inject 0.5 mLs into the muscle once.  Discontinued Medications   CALCIUM-VITAMIN D (OSCAL 500/200 D-3) 500-200 MG-UNIT PER TABLET    Take 1 tablet by mouth 2 (two) times daily.   LIDOCAINE (XYLOCAINE) 2 % SOLUTION    Use as directed 15 mLs in the mouth or throat as needed for mouth pain.   ZOSTER VACCINE LIVE, PF, (ZOSTAVAX) 16109 UNT/0.65ML INJECTION    Inject 19,400 Units into the skin once.    Review of Systems  Musculoskeletal: Positive for joint swelling, arthralgias and neck pain.  All other systems reviewed and are negative.   Filed Vitals:   09/04/15 1027  BP: 102/78  Pulse: 76  Temp: 97.9 F (36.6 C)  TempSrc: Oral  Height: 5\' 2"  (1.575 m)  Weight: 113 lb 12.8 oz (51.619 kg)  SpO2: 92%   Body mass index is 20.81 kg/(m^2).  Physical Exam  Constitutional: She is oriented to person, place, and time. She appears well-developed.  Neck: Neck supple. No JVD present. Muscular tenderness present. No spinous process tenderness present. Carotid bruit is not present. No rigidity. Decreased range of motion (L>R rotation) present. No tracheal deviation and no edema present. No Brudzinski's sign and no Kernig's sign noted. No thyromegaly present.    Musculoskeletal: She exhibits edema and tenderness.  Lymphadenopathy:    She has no cervical adenopathy.  Neurological: She is alert and oriented to person, place, and time.  Psychiatric: She has a normal mood and affect. Her behavior is normal. Thought content normal.     Labs reviewed: Appointment on 07/08/2015  Component Date Value Ref Range Status  . Glucose 07/08/2015 94  65 - 99 mg/dL Final  . BUN 07/08/2015 9  8 - 27 mg/dL Final  . Creatinine, Ser 07/08/2015 1.01* 0.57 - 1.00 mg/dL Final  . GFR calc non Af Amer 07/08/2015 55* >59 mL/min/1.73 Final  . GFR calc Af Amer 07/08/2015 64  >59 mL/min/1.73 Final  . BUN/Creatinine Ratio 07/08/2015 9* 12 - 28 Final                 **Please note reference interval  change**  . Sodium 07/08/2015 144  134 - 144 mmol/L Final  . Potassium 07/08/2015 3.7  3.5 - 5.2 mmol/L Final  . Chloride 07/08/2015 102  96 - 106 mmol/L Final  . CO2 07/08/2015 25  18 - 29 mmol/L Final  .  Calcium 07/08/2015 9.6  8.7 - 10.3 mg/dL Final  . TSH 07/08/2015 2.080  0.450 - 4.500 uIU/mL Final    No results found.   Assessment/Plan   ICD-9-CM ICD-10-CM   1. Neck pain 723.1 M54.2   2. Muscle spasms of neck 728.85 M62.48 tiZANidine (ZANAFLEX) 2 MG tablet    Continue muscle relaxer and voltaren gel as needed  May need cervical spine xray if pain persists or gets worse  Follow up as scheduled  Maudie Shingledecker S. Perlie Gold  [facility] [address] [place], [place] [address] [phone] Cell (Monday-Friday 8 AM - 5 PM) [phone] After 5 PM and follow prompts

## 2015-09-07 ENCOUNTER — Telehealth: Payer: Self-pay

## 2015-09-07 NOTE — Telephone Encounter (Signed)
Patient was seen in office on 09-04-15 and was given prescriptions for Tdap and Zoster vaccines to be taken to pharmacy of patient's choice. Prescriptions were left at office, so both of these were mailed to the patient on 09-07-15. Tina Roberts

## 2015-09-18 ENCOUNTER — Other Ambulatory Visit: Payer: Self-pay | Admitting: Internal Medicine

## 2015-09-18 DIAGNOSIS — Z1231 Encounter for screening mammogram for malignant neoplasm of breast: Secondary | ICD-10-CM

## 2015-09-25 ENCOUNTER — Other Ambulatory Visit: Payer: Self-pay | Admitting: Internal Medicine

## 2015-10-22 ENCOUNTER — Ambulatory Visit
Admission: RE | Admit: 2015-10-22 | Discharge: 2015-10-22 | Disposition: A | Discharge status: Home / Self Care | Payer: Commercial Managed Care - HMO | Source: Ambulatory Visit | Attending: Internal Medicine | Admitting: Internal Medicine

## 2015-10-22 DIAGNOSIS — Z1231 Encounter for screening mammogram for malignant neoplasm of breast: Secondary | ICD-10-CM

## 2015-10-28 ENCOUNTER — Encounter: Payer: Self-pay | Admitting: *Deleted

## 2015-11-25 NOTE — Addendum Note (Signed)
Addended by: Rafael Bihari A on: 11/25/2015 11:58 AM   Modules accepted: Orders

## 2015-12-10 ENCOUNTER — Other Ambulatory Visit: Payer: Self-pay | Admitting: *Deleted

## 2015-12-10 MED ORDER — PANTOPRAZOLE SODIUM 40 MG PO TBEC: 40.0000 mg | DELAYED_RELEASE_TABLET | Freq: Every day | ORAL | 3 refills | Status: DC

## 2015-12-10 MED ORDER — LEVOTHYROXINE SODIUM 75 MCG PO TABS: ORAL_TABLET | ORAL | 3 refills | Status: DC

## 2015-12-10 MED ORDER — AMLODIPINE BESYLATE 5 MG PO TABS: ORAL_TABLET | ORAL | 3 refills | Status: DC

## 2015-12-23 ENCOUNTER — Encounter: Payer: Self-pay | Admitting: Internal Medicine

## 2016-01-11 ENCOUNTER — Other Ambulatory Visit: Payer: Commercial Managed Care - HMO

## 2016-01-13 ENCOUNTER — Encounter: Payer: Commercial Managed Care - HMO | Admitting: Internal Medicine

## 2016-02-10 ENCOUNTER — Ambulatory Visit (INDEPENDENT_AMBULATORY_CARE_PROVIDER_SITE_OTHER): Payer: Commercial Managed Care - HMO | Admitting: Internal Medicine

## 2016-02-10 ENCOUNTER — Encounter: Payer: Self-pay | Admitting: Internal Medicine

## 2016-02-10 VITALS — BP 128/76 | HR 69 | Temp 97.9°F | Ht 62.0 in | Wt 123.6 lb

## 2016-02-10 DIAGNOSIS — R21 Rash and other nonspecific skin eruption: Secondary | ICD-10-CM | POA: Diagnosis not present

## 2016-02-10 DIAGNOSIS — L249 Irritant contact dermatitis, unspecified cause: Secondary | ICD-10-CM | POA: Diagnosis not present

## 2016-02-10 MED ORDER — TRIAMCINOLONE ACETONIDE 0.1 % EX CREA: 1.0000 "application " | TOPICAL_CREAM | Freq: Two times a day (BID) | CUTANEOUS | 0 refills | Status: DC

## 2016-02-10 NOTE — Patient Instructions (Addendum)
Recommend switching detergent back to previous one you were using as it could be the trigger to rash  Apply warm compress to nipple as needed for discomfort  Continue other medications as ordered  May take OTC mylanta and tussin as needed  Follow up as scheduled

## 2016-02-10 NOTE — Progress Notes (Signed)
Patient ID: Tina Roberts, female   DOB: 1941/03/04, 75 y.o.   MRN: [id number]    Location:  [facility] Place of Service: OFFICE  Chief Complaint  Patient presents with  . Acute Visit    Sore on breast on right side    HPI:  75 yo female seen today for right breast sore x 2 weeks. Initially painful but now resolved. She noticed white d/c when she presses on it and also had some swelling. No hx breast cancer. Last mammogram in July 2017 was benign. She changed laundry detergents to a cheaper brand prior to onset of sx's  Past Medical History:  Diagnosis Date  . Conjunctivitis unspecified   . Disorder of bone and cartilage, unspecified   . GERD (gastroesophageal reflux disease)   . Hepatic lesion   . History of gallstones   . Hyperlipidemia   . Hypertension   . Hypothyroidism   . Kidney stone   . Lacrimal disorder   . Myalgia and myositis, unspecified   . Osteoarthrosis, unspecified whether generalized or localized, unspecified site   . Osteopenia   . Osteoporosis   . Osteoporosis, unspecified   . Other and unspecified hyperlipidemia   . Other dysphagia   . Pain in joint, forearm   . Reflux esophagitis   . Unspecified disorder of kidney and ureter   . Unspecified essential hypertension   . Unspecified hypothyroidism   . Unspecified vitamin D deficiency   . Vitamin D deficiency   . Vitiligo   . Vitiligo     Past Surgical History:  Procedure Laterality Date  . LEG SURGERY     right  . [place]    Patient Care Team: Gildardo Cranker, DO as PCP - General (Internal Medicine)  Social History   Social History  . Marital status: Single    Spouse name: N/A  . Number of children: 4  . Years of education: N/A   Occupational History  . Not on file.   Social History Main Topics  . Smoking status: Never Smoker  . Smokeless tobacco: Never Used  . Alcohol use No  . Drug use: No  . Sexual activity: Not on file   Other Topics Concern  . Not on file   Social  History Narrative  . No narrative on file     reports that she has never smoked. She has never used smokeless tobacco. She reports that she does not drink alcohol or use drugs.  Family History  Problem Relation Age of Onset  . Colon cancer Mother 32  . Diabetes Mother   . Diabetes Sister    Family Status  Relation Status  . Mother Deceased  . Sister Alive  . Father Deceased  . Brother Alive  . Son Alive  . Brother Alive  . Brother Alive  . Son Alive  . Son Alive  . Son Alive     No Known Allergies  Medications: Patient's Medications  New Prescriptions   No medications on file  Previous Medications   AMLODIPINE (NORVASC) 5 MG TABLET    Take one tablet by mouth once daily   CALCIUM-VITAMIN D (OSCAL WITH D) 500-200 MG-UNIT TABLET    Take 1 tablet by mouth daily with breakfast.   CALCIUM-VITAMIN D (OSCAL WITH D) 500-200 MG-UNIT TABS TABLET    TAKE 1 TABLET BY MOUTH TWICE A DAY   DICLOFENAC SODIUM (VOLTAREN) 1 % GEL    Apply 2 g topically 4 (four) times daily.  DIGESTIVE ENZYMES (ENZYME DIGEST PO)    Take 1 tablet ( 500 mg ) by mouth at meal time  For acid reflux   LEVOTHYROXINE (SYNTHROID, LEVOTHROID) 75 MCG TABLET    Take one tablet by mouth once daily for thyroid   LORATADINE (CLARITIN) 10 MG TABLET    Take 1 tablet (10 mg total) by mouth daily.   PANTOPRAZOLE (PROTONIX) 40 MG TABLET    Take 1 tablet (40 mg total) by mouth daily.   TDAP (BOOSTRIX) 5-2.5-18.5 LF-MCG/0.5 INJECTION    Inject 0.5 mLs into the muscle once.   TIZANIDINE (ZANAFLEX) 2 MG TABLET    Take 0.5-1 tablets (1-2 mg total) by mouth every 8 (eight) hours as needed for muscle spasms.   VALACYCLOVIR (VALTREX) 500 MG TABLET    Take 1 tablet (500 mg total) by mouth daily.   ZOSTER VACCINE LIVE, PF, (ZOSTAVAX) 16109 UNT/0.65ML INJECTION    Inject 19,400 Units into the skin once.  Modified Medications   No medications on file  Discontinued Medications   No medications on file    Review of Systems  Skin:  Positive for rash.  All other systems reviewed and are negative.   Vitals:   02/10/16 1031  BP: 128/76  Pulse: 69  Temp: 97.9 F (36.6 C)  TempSrc: Oral  SpO2: 95%  Weight: 123 lb 9.6 oz (56.1 kg)  Height: 5\' 2"  (1.575 m)   Body mass index is 22.61 kg/m.  Physical Exam  Pulmonary/Chest: Right breast exhibits skin change. Right breast exhibits no inverted nipple, no mass, no nipple discharge and no tenderness. Left breast exhibits no inverted nipple, no mass, no nipple discharge, no skin change and no tenderness. Breasts are symmetrical.    Skin: Skin is warm and dry. Rash noted.  vitiligo     Labs reviewed: No visits with results within 3 Month(s) from this visit.  Latest known visit with results is:  Appointment on 07/08/2015  Component Date Value Ref Range Status  . Glucose 07/09/2015 94  65 - 99 mg/dL Final  . BUN 07/09/2015 9  8 - 27 mg/dL Final  . Creatinine, Ser 07/09/2015 1.01* 0.57 - 1.00 mg/dL Final  . GFR calc non Af Amer 07/09/2015 55* >59 mL/min/1.73 Final  . GFR calc Af Amer 07/09/2015 64  >59 mL/min/1.73 Final  . BUN/Creatinine Ratio 07/09/2015 9* 12 - 28 Final  . Sodium 07/09/2015 144  134 - 144 mmol/L Final  . Potassium 07/09/2015 3.7  3.5 - 5.2 mmol/L Final  . Chloride 07/09/2015 102  96 - 106 mmol/L Final  . CO2 07/09/2015 25  18 - 29 mmol/L Final  . Calcium 07/09/2015 9.6  8.7 - 10.3 mg/dL Final  . TSH 07/09/2015 2.080  0.450 - 4.500 uIU/mL Final    No results found.   Assessment/Plan   ICD-9-CM ICD-10-CM   1. Irritant contact dermatitis, unspecified trigger 692.9 L24.9 triamcinolone cream (KENALOG) 0.1 %  2. Rash 782.1 R21    Recommend switching detergent back to previous one you were using as it could be the trigger to rash  Apply warm compress to nipple as needed for discomfort  Continue other medications as ordered  May take OTC mylanta and tussin as needed  Follow up as scheduled   Erryn Dickison S. Perlie Gold    [facility] [address] [place], [place] [address] [phone] Cell (Monday-Friday 8 AM - 5 PM) [phone] After 5 PM and follow  prompts

## 2016-03-16 ENCOUNTER — Other Ambulatory Visit: Payer: Commercial Managed Care - HMO

## 2016-03-16 ENCOUNTER — Ambulatory Visit (INDEPENDENT_AMBULATORY_CARE_PROVIDER_SITE_OTHER): Payer: Commercial Managed Care - HMO

## 2016-03-16 VITALS — BP 132/80 | HR 66 | Temp 98.1°F | Ht 62.0 in | Wt 115.8 lb

## 2016-03-16 DIAGNOSIS — I10 Essential (primary) hypertension: Secondary | ICD-10-CM

## 2016-03-16 DIAGNOSIS — Z0000 Encounter for general adult medical examination without abnormal findings: Secondary | ICD-10-CM | POA: Diagnosis not present

## 2016-03-16 DIAGNOSIS — E034 Atrophy of thyroid (acquired): Secondary | ICD-10-CM

## 2016-03-16 DIAGNOSIS — H9193 Unspecified hearing loss, bilateral: Secondary | ICD-10-CM

## 2016-03-16 DIAGNOSIS — E785 Hyperlipidemia, unspecified: Secondary | ICD-10-CM

## 2016-03-16 LAB — TSH: TSH: 3.71 mIU/L

## 2016-03-16 LAB — T4, FREE: Free T4: 1.2 ng/dL (ref 0.8–1.8)

## 2016-03-16 LAB — CBC WITH DIFFERENTIAL/PLATELET
Basophils Absolute: 42 cells/uL (ref 0–200)
Basophils Relative: 1 %
Eosinophils Absolute: 126 cells/uL (ref 15–500)
Eosinophils Relative: 3 %
HCT: 42.7 % (ref 35.0–45.0)
Hemoglobin: 14 g/dL (ref 11.7–15.5)
Lymphocytes Relative: 41 %
Lymphs Abs: 1722 cells/uL (ref 850–3900)
MCH: 28.3 pg (ref 27.0–33.0)
MCHC: 32.8 g/dL (ref 32.0–36.0)
MCV: 86.4 fL (ref 80.0–100.0)
MPV: 9.3 fL (ref 7.5–12.5)
Monocytes Absolute: 420 cells/uL (ref 200–950)
Monocytes Relative: 10 %
Neutro Abs: 1890 cells/uL (ref 1500–7800)
Neutrophils Relative %: 45 %
Platelets: 192 10*3/uL (ref 140–400)
RBC: 4.94 MIL/uL (ref 3.80–5.10)
RDW: 14 % (ref 11.0–15.0)
WBC: 4.2 10*3/uL (ref 3.8–10.8)

## 2016-03-16 LAB — LIPID PANEL
Cholesterol: 211 mg/dL — ABNORMAL HIGH (ref <200)
HDL: 70 mg/dL (ref >50)
LDL Cholesterol: 124 mg/dL — ABNORMAL HIGH (ref <100)
Total CHOL/HDL Ratio: 3 Ratio (ref <5.0)
Triglycerides: 85 mg/dL (ref <150)
VLDL: 17 mg/dL (ref <30)

## 2016-03-16 LAB — COMPREHENSIVE METABOLIC PANEL
ALT: 21 U/L (ref 6–29)
AST: 34 U/L (ref 10–35)
Albumin: 4.3 g/dL (ref 3.6–5.1)
Alkaline Phosphatase: 47 U/L (ref 33–130)
BUN: 25 mg/dL (ref 7–25)
CO2: 26 mmol/L (ref 20–31)
Calcium: 9.8 mg/dL (ref 8.6–10.4)
Chloride: 102 mmol/L (ref 98–110)
Creat: 1.18 mg/dL — ABNORMAL HIGH (ref 0.60–0.93)
Glucose, Bld: 87 mg/dL (ref 65–99)
Potassium: 4.2 mmol/L (ref 3.5–5.3)
Sodium: 139 mmol/L (ref 135–146)
Total Bilirubin: 0.4 mg/dL (ref 0.2–1.2)
Total Protein: 7.7 g/dL (ref 6.1–8.1)

## 2016-03-16 NOTE — Progress Notes (Signed)
Quick Notes   Health Maintenance:   Not due    Abnormal Screen:  None; MMSE-24/30; Passed Clock Test   Patient Concerns:  Pt c/o burning and tingling in her mouth on occasion. Also, states she has a greenish color vaginal discharge occasionally. Denies odor, burning or tingling in the vagina.     Nurse Concerns:  Pt failed my hearing screen. Referral sent for Audiology due to hearing loss.  Marland Kitchen

## 2016-03-16 NOTE — Progress Notes (Signed)
Subjective:   Tina Roberts is a 75 y.o. female who presents for an Initial Medicare Annual Wellness Visit.  Review of Systems     Cardiac Risk Factors include: advanced age (>41men, >46 women);hypertension     Objective:    Today's Vitals   03/16/16 0827 03/16/16 0828  BP: 132/80   Pulse: 66   Temp: 98.1 F (36.7 C)   TempSrc: Oral   SpO2: 98%   Weight: 115 lb 12.8 oz (52.5 kg)   Height: 5\' 2"  (1.575 m)   PainSc:  1    Body mass index is 21.18 kg/m.   Current Medications (verified) Outpatient Encounter Prescriptions as of 03/16/2016  Medication Sig  . amLODipine (NORVASC) 5 MG tablet Take one tablet by mouth once daily  . calcium-vitamin D (OSCAL WITH D) 500-200 MG-UNIT tablet Take 1 tablet by mouth daily with breakfast.  . diclofenac sodium (VOLTAREN) 1 % GEL Apply 2 g topically 4 (four) times daily.  . Digestive Enzymes (ENZYME DIGEST PO) Take 1 tablet ( 500 mg ) by mouth at meal time  For acid reflux  . levothyroxine (SYNTHROID, LEVOTHROID) 75 MCG tablet Take one tablet by mouth once daily for thyroid  . pantoprazole (PROTONIX) 40 MG tablet Take 1 tablet (40 mg total) by mouth daily.  . Tdap (BOOSTRIX) 5-2.5-18.5 LF-MCG/0.5 injection Inject 0.5 mLs into the muscle once.  . triamcinolone cream (KENALOG) 0.1 % Apply 1 application topically 2 (two) times daily. Apply to right nipple until rash is gone  . Zoster Vaccine Live, PF, (ZOSTAVAX) 28413 UNT/0.65ML injection Inject 19,400 Units into the skin once.  . [DISCONTINUED] valACYclovir (VALTREX) 500 MG tablet Take 1 tablet (500 mg total) by mouth daily.  . [DISCONTINUED] calcium-vitamin D (OSCAL WITH D) 500-200 MG-UNIT TABS tablet TAKE 1 TABLET BY MOUTH TWICE A DAY  . [DISCONTINUED] loratadine (CLARITIN) 10 MG tablet Take 1 tablet (10 mg total) by mouth daily.  . [DISCONTINUED] tiZANidine (ZANAFLEX) 2 MG tablet Take 0.5-1 tablets (1-2 mg total) by mouth every 8 (eight) hours as needed for muscle spasms.   No  facility-administered encounter medications on file as of 03/16/2016.     Allergies (verified) Patient has no known allergies.   History: Past Medical History:  Diagnosis Date  . Conjunctivitis unspecified   . Disorder of bone and cartilage, unspecified   . GERD (gastroesophageal reflux disease)   . Hepatic lesion   . History of gallstones   . Hyperlipidemia   . Hypertension   . Hypothyroidism   . Kidney stone   . Lacrimal disorder   . Myalgia and myositis, unspecified   . Osteoarthrosis, unspecified whether generalized or localized, unspecified site   . Osteopenia   . Osteoporosis   . Osteoporosis, unspecified   . Other and unspecified hyperlipidemia   . Other dysphagia   . Pain in joint, forearm   . Reflux esophagitis   . Unspecified disorder of kidney and ureter   . Unspecified essential hypertension   . Unspecified hypothyroidism   . Unspecified vitamin D deficiency   . Vitamin D deficiency   . Vitiligo   . Vitiligo    Past Surgical History:  Procedure Laterality Date  . LEG SURGERY     right  . TUBAL LIGATION  1973   Family History  Problem Relation Age of Onset  . Colon cancer Mother 64  . Diabetes Mother   . Diabetes Sister    Social History   Occupational History  . Not on  file.   Social History Main Topics  . Smoking status: Never Smoker  . Smokeless tobacco: Never Used  . Alcohol use No  . Drug use: No  . Sexual activity: No    Tobacco Counseling Counseling given: No   Activities of Daily Living In your present state of health, do you have any difficulty performing the following activities: 03/16/2016  Hearing? Y  Vision? Y  Difficulty concentrating or making decisions? N  Walking or climbing stairs? N  Dressing or bathing? N  Doing errands, shopping? N  Preparing Food and eating ? N  Using the Toilet? N  In the past six months, have you accidently leaked urine? N  Do you have problems with loss of bowel control? N  Managing your  Medications? N  Managing your Finances? N  Housekeeping or managing your Housekeeping? N  Some recent data might be hidden    Immunizations and Health Maintenance Immunization History  Administered Date(s) Administered  . Influenza Whole 12/03/2012  . Influenza,inj,Quad PF,36+ Mos 01/29/2014, 01/09/2015  . Influenza-Unspecified 01/20/2016  . Pneumococcal Conjugate-13 01/29/2014  . Pneumococcal Polysaccharide-23 11/20/2012   There are no preventive care reminders to display for this patient.  Patient Care Team: Gildardo Cranker, DO as PCP - General (Internal Medicine)  Indicate any recent Medical Services you may have received from other than [facility] providers in the past year (date may be approximate).     Assessment:   This is a routine wellness examination for Tina Roberts.  Hearing/Vision screen  Hearing Screening   125Hz  250Hz  500Hz  1000Hz  2000Hz  3000Hz  4000Hz  6000Hz  8000Hz   Right ear:   0 0 0  0    Left ear:   0 0 0  0    Comments: Pt due for full hearing screen. Referral needed.   Vision Screening Comments: Last eye exam done 06/2015 w. Dr. Gershon Crane.   Dietary issues and exercise activities discussed: Current Exercise Habits: Structured exercise class, Type of exercise: walking;strength training/weights;stretching, Time (Minutes): 30, Frequency (Times/Week): 4, Weekly Exercise (Minutes/Week): 120, Intensity: Mild  Goals    . Exercise 3x per week (30 min per time)          Starting 03/16/16, I will maintain my current exercise routine of 3 days per week.       Depression Screen PHQ 2/9 Scores 03/16/2016 07/10/2015 01/09/2015 04/11/2014 01/29/2014 01/29/2014 02/27/2013  PHQ - 2 Score 0 0 0 0 0 0 0    Fall Risk Fall Risk  03/16/2016 02/10/2016 09/04/2015 07/10/2015 04/22/2015  Falls in the past year? No No No No No    Cognitive Function: MMSE - Mini Mental State Exam 03/16/2016 01/09/2015  Orientation to time 5 5  Orientation to Place 5 5  Registration 3 3  Attention/  Calculation 0 0  Recall 3 3  Language- name 2 objects 2 2  Language- repeat 1 1  Language- follow 3 step command 3 3  Language- read & follow direction 1 1  Write a sentence 1 1  Copy design 0 0  Total score 24 24        Screening Tests Health Maintenance  Topic Date Due  . ZOSTAVAX  04/03/2017 (Originally 11/21/2001)  . TETANUS/TDAP  04/03/2017 (Originally 11/21/1960)  . MAMMOGRAM  10/21/2017  . COLONOSCOPY  01/21/2020  . INFLUENZA VACCINE  Completed  . DEXA SCAN  Completed  . PNA vac Low Risk Adult  Completed      Plan:    I have personally reviewed and addressed  the Medicare Annual Wellness questionnaire and have noted the following in the patient's chart:  A. Medical and social history B. Use of alcohol, tobacco or illicit drugs  C. Current medications and supplements D. Functional ability and status E.  Nutritional status F.  Physical activity G. Advance directives H. List of other physicians I.  Hospitalizations, surgeries, and ER visits in previous 12 months J.  [place] to include hearing, vision, cognitive, depression L. Referrals and appointments - none  In addition, I have reviewed and discussed with patient certain preventive protocols, quality metrics, and best practice recommendations. A written personalized care plan for preventive services as well as general preventive health recommendations were provided to patient.  See attached scanned questionnaire for additional information.   Signed,   Allyn Kenner, [place]. Perlie Gold  [facility] [address] [place], [place] [address] [phone] Cell (Monday-Friday 8 AM - 5 PM) [phone] After 5 PM and follow prompts

## 2016-03-16 NOTE — Patient Instructions (Addendum)
Tina Roberts , Thank you for taking time to come for your Medicare Wellness Visit. I appreciate your ongoing commitment to your health goals. Please review the following plan we discussed and let me know if I can assist you in the future.   These are the goals we discussed: Goals    . Exercise 3x per week (30 min per time)          Starting 03/16/16, I will maintain my current exercise routine of 3 days per week.        This is a list of the screening recommended for you and due dates:  Health Maintenance  Topic Date Due  . Shingles Vaccine  04/03/2017*  . Tetanus Vaccine  04/03/2017*  . Mammogram  10/21/2017  . Colon Cancer Screening  01/21/2020  . Flu Shot  Completed  . DEXA scan (bone density measurement)  Completed  . Pneumonia vaccines  Completed  *Topic was postponed. The date shown is not the original due date.  Preventive Care for Adults  A healthy lifestyle and preventive care can promote health and wellness. Preventive health guidelines for adults include the following key practices.  . A routine yearly physical is a good way to check with your health care provider about your health and preventive screening. It is a chance to share any concerns and updates on your health and to receive a thorough exam.  . Visit your dentist for a routine exam and preventive care every 6 months. Brush your teeth twice a day and floss once a day. Good oral hygiene prevents tooth decay and gum disease.  . The frequency of eye exams is based on your age, health, family medical history, use  of contact lenses, and other factors. Follow your health care provider's ecommendations for frequency of eye exams.  . Eat a healthy diet. Foods like vegetables, fruits, whole grains, low-fat dairy products, and lean protein foods contain the nutrients you need without too many calories. Decrease your intake of foods high in solid fats, added sugars, and salt. Eat the right amount of calories for you. Get  information about a proper diet from your health care provider, if necessary.  . Regular physical exercise is one of the most important things you can do for your health. Most adults should get at least 150 minutes of moderate-intensity exercise (any activity that increases your heart rate and causes you to sweat) each week. In addition, most adults need muscle-strengthening exercises on 2 or more days a week.  Silver Sneakers may be a benefit available to you. To determine eligibility, you may visit the website: www.silversneakers.com or contact program at (401) [phone] Mon-Fri between 8AM-8PM.   . Maintain a healthy weight. The body mass index (BMI) is a screening tool to identify possible weight problems. It provides an estimate of body fat based on height and weight. Your health care provider can find your BMI and can help you achieve or maintain a healthy weight.   For adults 20 years and older: ? A BMI below 18.5 is considered underweight. ? A BMI of 18.5 to 24.9 is normal. ? A BMI of 25 to 29.9 is considered overweight. ? A BMI of 30 and above is considered obese.   . Maintain normal blood lipids and cholesterol levels by exercising and minimizing your intake of saturated fat. Eat a balanced diet with plenty of fruit and vegetables. Blood tests for lipids and cholesterol should begin at age 23 and be repeated every 5 years.  If your lipid or cholesterol levels are high, you are over 50, or you are at high risk for heart disease, you may need your cholesterol levels checked more frequently. Ongoing high lipid and cholesterol levels should be treated with medicines if diet and exercise are not working.  . If you smoke, find out from your health care provider how to quit. If you do not use tobacco, please do not start.  . If you choose to drink alcohol, please do not consume more than 2 drinks per day. One drink is considered to be 12 ounces (355 mL) of beer, 5 ounces (148 mL) of wine, or 1.5  ounces (44 mL) of liquor.  . If you are 12-10 years old, ask your health care provider if you should take aspirin to prevent strokes.  . Use sunscreen. Apply sunscreen liberally and repeatedly throughout the day. You should seek shade when your shadow is shorter than you. Protect yourself by wearing long sleeves, pants, a wide-brimmed hat, and sunglasses year round, whenever you are outdoors.  . Once a month, do a whole body skin exam, using a mirror to look at the skin on your back. Tell your health care provider of new moles, moles that have irregular borders, moles that are larger than a pencil eraser, or moles that have changed in shape or color.

## 2016-03-17 LAB — URINALYSIS, ROUTINE W REFLEX MICROSCOPIC
Bilirubin Urine: NEGATIVE
Glucose, UA: NEGATIVE
Hgb urine dipstick: NEGATIVE
Ketones, ur: NEGATIVE
Leukocytes, UA: NEGATIVE
Nitrite: NEGATIVE
Protein, ur: NEGATIVE
Specific Gravity, Urine: 1.013 (ref 1.001–1.035)
pH: 6 (ref 5.0–8.0)

## 2016-03-18 ENCOUNTER — Other Ambulatory Visit: Payer: Commercial Managed Care - HMO

## 2016-03-23 ENCOUNTER — Ambulatory Visit (INDEPENDENT_AMBULATORY_CARE_PROVIDER_SITE_OTHER): Payer: Commercial Managed Care - HMO | Admitting: Internal Medicine

## 2016-03-23 ENCOUNTER — Encounter: Payer: Commercial Managed Care - HMO | Admitting: Internal Medicine

## 2016-03-23 ENCOUNTER — Encounter: Payer: Self-pay | Admitting: Internal Medicine

## 2016-03-23 VITALS — BP 112/68 | HR 68 | Temp 98.3°F | Ht 62.0 in | Wt 118.8 lb

## 2016-03-23 DIAGNOSIS — E034 Atrophy of thyroid (acquired): Secondary | ICD-10-CM | POA: Diagnosis not present

## 2016-03-23 DIAGNOSIS — E782 Mixed hyperlipidemia: Secondary | ICD-10-CM

## 2016-03-23 DIAGNOSIS — J302 Other seasonal allergic rhinitis: Secondary | ICD-10-CM | POA: Diagnosis not present

## 2016-03-23 DIAGNOSIS — Z0000 Encounter for general adult medical examination without abnormal findings: Secondary | ICD-10-CM

## 2016-03-23 DIAGNOSIS — I10 Essential (primary) hypertension: Secondary | ICD-10-CM

## 2016-03-23 DIAGNOSIS — L249 Irritant contact dermatitis, unspecified cause: Secondary | ICD-10-CM

## 2016-03-23 MED ORDER — TETANUS-DIPHTH-ACELL PERTUSSIS 5-2.5-18.5 LF-MCG/0.5 IM SUSP: 0.5000 mL | Freq: Once | INTRAMUSCULAR | 0 refills | Status: AC

## 2016-03-23 MED ORDER — ZOSTER VACCINE LIVE 19400 UNT/0.65ML ~~LOC~~ SUSR: 0.6500 mL | Freq: Once | SUBCUTANEOUS | 0 refills | Status: AC

## 2016-03-23 NOTE — Patient Instructions (Addendum)
Encouraged her to exercise 30-45 minutes 4-5 times per week. Eat a well balanced diet. Avoid smoking. Limit alcohol intake. Wear seatbelt when riding in the car. Wear sun block (SPF >50) when spending extended times outside.  Continue current medications as ordered  Follow up in 6 mos for routine visit. Fasting labs prior to appt

## 2016-03-23 NOTE — Progress Notes (Signed)
Patient ID: Tina Roberts, female   DOB: May 16, 1940, 75 y.o.   MRN: [id number]   Location:  [facility]  Place of Service:  OFFICE  Provider: Arletha Grippe, DO  Patient Care Team: Gildardo Cranker, DO as PCP - General (Internal Medicine)  Extended Emergency Contact Information Primary Emergency Contact: Andrew,Geraldine Address: [address]          [place], [place] Phone: [phone] Relation: Sister Secondary Emergency Contact: [facility] Address: [address]           [place], [place] [address] John[place]te [place] of [place] Phone: [phone] Relation: Son  Code Status:  Goals of Care: Advanced Directive information Advanced Directives 03/16/2016  Does Patient Have a Medical Advance Directive? Yes  Type of Advance Directive [place] in Chart? No - copy requested  Would patient like information on creating a medical advance directive? -     Chief Complaint  Patient presents with  . Medical Management of Chronic Issues    Extended Visit, MMSE completed on 03/16/16 (24/30, passed clock drawing), EKG, and discuss labs (copy printed)   . Breast Problem    Examine left breast/nipple, patient c/o slow healing rash.     HPI: Patient is a 75 y.o. female seen in today for an annual wellness exam.    Right breast irritant contact dermatitis - improving with triamcinolone cream  She has hx elevated cholesterol but controls it with diet and exercise. LDL 124; Tchol 211; HDL 70  She is taking calcium w D for osteoporosis. Fosamax stopped several mos ago due to ADRs. Never tried prolia injections  BP stable on amlodipine  She has GERD/HH and takes digestive enzymes and protonix. Sx's controlled most times  Hypothyroid - stable on levothyroxine. TSH 3.71; T4 free 1.2  Tongue paresthesias - now using OTC mouth wash with spicy taste which helps. She has seen ENT    Depression screen [facility] 2/9  03/16/2016 07/10/2015 01/09/2015 04/11/2014 01/29/2014  Decreased Interest 0 0 0 0 0  Down, Depressed, Hopeless 0 0 0 0 0  PHQ - 2 Score 0 0 0 0 0    Fall Risk  03/16/2016 02/10/2016 09/04/2015 07/10/2015 04/22/2015  Falls in the past year? No No No No No   MMSE - Mini Mental State Exam 03/16/2016 01/09/2015  Orientation to time 5 5  Orientation to Place 5 5  Registration 3 3  Attention/ Calculation 0 0  Recall 3 3  Language- name 2 objects 2 2  Language- repeat 1 1  Language- follow 3 step command 3 3  Language- read & follow direction 1 1  Write a sentence 1 1  Copy design 0 0  Total score 24 24     Health Maintenance  Topic Date Due  . ZOSTAVAX  04/03/2017 (Originally 11/21/2001)  . TETANUS/TDAP  04/03/2017 (Originally 11/21/1960)  . MAMMOGRAM  10/21/2017  . COLONOSCOPY  01/21/2020  . INFLUENZA VACCINE  Completed  . DEXA SCAN  Completed  . PNA vac Low Risk Adult  Completed    Functional Status Survey: Is the patient deaf or have difficulty hearing?: Yes Does the patient have difficulty seeing, even when wearing glasses/contacts?: Yes Does the patient have difficulty concentrating, remembering, or making decisions?: No Does the patient have difficulty walking or climbing stairs?: No Does the patient have difficulty dressing or bathing?: No Does the patient have difficulty doing errands alone such as visiting a  doctor's office or shopping?: No  No exam data present   Past Medical History:  Diagnosis Date  . Conjunctivitis unspecified   . Disorder of bone and cartilage, unspecified   . GERD (gastroesophageal reflux disease)   . Hepatic lesion   . History of gallstones   . Hyperlipidemia   . Hypertension   . Hypothyroidism   . Kidney stone   . Lacrimal disorder   . Myalgia and myositis, unspecified   . Osteoarthrosis, unspecified whether generalized or localized, unspecified site   . Osteopenia   . Osteoporosis   . Osteoporosis, unspecified   . Other and unspecified  hyperlipidemia   . Other dysphagia   . Pain in joint, forearm   . Reflux esophagitis   . Unspecified disorder of kidney and ureter   . Unspecified essential hypertension   . Unspecified hypothyroidism   . Unspecified vitamin D deficiency   . Vitamin D deficiency   . Vitiligo   . Vitiligo     Past Surgical History:  Procedure Laterality Date  . LEG SURGERY     right  . TUBAL LIGATION  1973    Family History  Problem Relation Age of Onset  . Colon cancer Mother 98  . Diabetes Mother   . Diabetes Sister    Family Status  Relation Status  . Mother Deceased  . Sister Alive  . Father Deceased  . Brother Alive  . Son Alive  . Brother Alive  . Brother Alive  . Son Alive  . Son Alive  . Son Alive    Social History   Social History  . Marital status: Married    Spouse name: N/A  . Number of children: 4  . Years of education: N/A   Occupational History  . Not on file.   Social History Main Topics  . Smoking status: Never Smoker  . Smokeless tobacco: Never Used  . Alcohol use No  . Drug use: No  . Sexual activity: No   Other Topics Concern  . Not on file   Social History Narrative  . No narrative on file    No Known Allergies  Allergies as of 03/23/2016   No Known Allergies     Medication List       Accurate as of 03/23/16 10:47 AM. Always use your most recent med list.          amLODipine 5 MG tablet Commonly known as:  NORVASC Take one tablet by mouth once daily   calcium-vitamin D 500-200 MG-UNIT tablet Commonly known as:  OSCAL WITH D Take 1 tablet by mouth daily with breakfast.   diclofenac sodium 1 % Gel Commonly known as:  VOLTAREN Apply 2 g topically 4 (four) times daily.   ENZYME DIGEST PO Take 1 tablet ( 500 mg ) by mouth at meal time  For acid reflux   levothyroxine 75 MCG tablet Commonly known as:  SYNTHROID, LEVOTHROID Take one tablet by mouth once daily for thyroid   pantoprazole 40 MG tablet Commonly known as:   PROTONIX Take 1 tablet (40 mg total) by mouth daily.   Tdap 5-2.5-18.5 LF-MCG/0.5 injection Commonly known as:  BOOSTRIX Inject 0.5 mLs into the muscle once.   triamcinolone cream 0.1 % Commonly known as:  KENALOG Apply 1 application topically 2 (two) times daily. Apply to right nipple until rash is gone   Zoster Vaccine Live (PF) 19400 UNT/0.65ML injection Commonly known as:  ZOSTAVAX Inject 19,400 Units into the skin once.  Review of Systems:  Review of Systems  Gastrointestinal: Positive for abdominal pain.  Skin: Positive for rash.  Neurological: Positive for numbness.  All other systems reviewed and are negative.   Physical Exam: Vitals:   03/23/16 0957  BP: 112/68  Pulse: 68  Temp: 98.3 F (36.8 C)  TempSrc: Oral  SpO2: 97%  Weight: 118 lb 12.8 oz (53.9 kg)  Height: '5\' 2"'  (1.575 m)   Body mass index is 21.73 kg/m. Physical Exam  Constitutional: She is oriented to person, place, and time. She appears well-developed and well-nourished. No distress.  Frail appearing   HENT:  Head: Normocephalic and atraumatic.  Right Ear: Hearing, tympanic membrane, external ear and ear canal normal.  Left Ear: Hearing, tympanic membrane, external ear and ear canal normal.  Mouth/Throat: Uvula is midline, oropharynx is clear and moist and mucous membranes are normal. She does not have dentures.  L>R distal tongue redness and change in mucosa. No ulceration. No masses seen. Poor dentition but no signs of secondary infection  Eyes: Conjunctivae, EOM and lids are normal. Pupils are equal, round, and reactive to light. No scleral icterus.  Neck: Trachea normal and normal range of motion. Neck supple. Carotid bruit is not present. No tracheal deviation present. No thyroid mass and no thyromegaly present.  Cardiovascular: Normal rate, regular rhythm, normal heart sounds and intact distal pulses.  Exam reveals no gallop and no friction rub.   No murmur heard. No LE edema b/l.  no calf TTP.   Pulmonary/Chest: Effort normal and breath sounds normal. No stridor. No respiratory distress. She has no wheezes. She has no rhonchi. She has no rales. Right breast exhibits no inverted nipple, no mass, no nipple discharge, no skin change and no tenderness. Left breast exhibits no inverted nipple, no mass, no nipple discharge, no skin change and no tenderness. Breasts are symmetrical.    Abdominal: Soft. Normal appearance, normal aorta and bowel sounds are normal. She exhibits no distension, no pulsatile midline mass and no mass. There is no hepatosplenomegaly or hepatomegaly. There is no tenderness. There is no rigidity, no rebound and no guarding. No hernia.  Musculoskeletal: Normal range of motion. She exhibits edema (right knee swelling).  Lymphadenopathy:       Head (right side): No submental, no submandibular and no posterior auricular adenopathy present.       Head (left side): No submental, no submandibular and no posterior auricular adenopathy present.    She has no cervical adenopathy.       Right: No supraclavicular adenopathy present.       Left: No supraclavicular adenopathy present.  Neurological: She is alert and oriented to person, place, and time. She has normal strength and normal reflexes. No cranial nerve deficit. Gait normal.  Skin: Skin is warm, dry and intact. Rash (vitiligo) noted. Nails show no clubbing.  Psychiatric: She has a normal mood and affect. Her speech is normal and behavior is normal. Thought content normal. Cognition and memory are normal.    Labs reviewed:  Basic Metabolic Panel:  Recent Labs  07/08/15 0824 03/16/16 0810  NA 144 139  K 3.7 4.2  CL 102 102  CO2 25 26  GLUCOSE 94 87  BUN 9 25  CREATININE 1.01* 1.18*  CALCIUM 9.6 9.8  TSH 2.080 3.71   Liver Function Tests:  Recent Labs  03/16/16 0810  AST 34  ALT 21  ALKPHOS 47  BILITOT 0.4  PROT 7.7  ALBUMIN 4.3   No results for  input(s): LIPASE, AMYLASE in the last  8760 hours. No results for input(s): AMMONIA in the last 8760 hours. CBC:  Recent Labs  03/16/16 0810  WBC 4.2  NEUTROABS 1,890  HGB 14.0  HCT 42.7  MCV 86.4  PLT 192   Lipid Panel:  Recent Labs  03/16/16 0810  CHOL 211*  HDL 70  LDLCALC 124*  TRIG 85  CHOLHDL 3.0   No results found for: HGBA1C  Procedures: ECG OBTAINED AND REVIEWED BY MYSELF: NSR @ 66 bpm, nml axis, LAE, poor R wave progression. No acute ischemic changes. No change since 07/2013  Assessment/Plan   ICD-9-CM ICD-10-CM   1. Well adult exam V70.0 Z00.00   2. Essential hypertension, benign 401.1 I10 EKG 12-Lead     BMP with eGFR     ALT  3. Irritant contact dermatitis, unspecified trigger 692.9 L24.9   4. Other seasonal allergic rhinitis 477.8 J30.2   5. Hypothyroidism due to acquired atrophy of thyroid 244.8 E03.4 TSH   246.8    6. Mixed hyperlipidemia 272.2 E78.2 ALT     Lipid Panel     Pt is UTD on health maintenance. Vaccinations are UTD. Pt maintains a healthy lifestyle. Encouraged pt to exercise 30-45 minutes 4-5 times per week. Eat a well balanced diet. Avoid smoking. Limit alcohol intake. Wear seatbelt when riding in the car. Wear sun block (SPF >50) when spending extended times outside.  Continue current medications as ordered  Follow up in 6 mos for routine visit. Fasting labs prior to appt   [place] S. Perlie Gold  [facility] [address] [place], [place] [address] [phone] Cell (Monday-Friday 8 AM - 5 PM) [phone] After 5 PM and follow prompts

## 2016-06-09 ENCOUNTER — Other Ambulatory Visit: Payer: Self-pay | Admitting: *Deleted

## 2016-06-09 MED ORDER — PANTOPRAZOLE SODIUM 40 MG PO TBEC: 40.0000 mg | DELAYED_RELEASE_TABLET | Freq: Every day | ORAL | 3 refills | Status: DC

## 2016-06-09 MED ORDER — LEVOTHYROXINE SODIUM 75 MCG PO TABS: ORAL_TABLET | ORAL | 3 refills | Status: DC

## 2016-06-09 MED ORDER — AMLODIPINE BESYLATE 5 MG PO TABS: ORAL_TABLET | ORAL | 3 refills | Status: DC

## 2016-06-09 NOTE — Telephone Encounter (Signed)
Patient requested Rx's to be sent to [facility]. Patient changed from [facility]

## 2016-06-17 ENCOUNTER — Encounter: Payer: Self-pay | Admitting: Internal Medicine

## 2016-06-21 ENCOUNTER — Other Ambulatory Visit: Payer: Self-pay | Admitting: Internal Medicine

## 2016-06-22 ENCOUNTER — Other Ambulatory Visit: Payer: Self-pay | Admitting: *Deleted

## 2016-06-22 MED ORDER — LEVOTHYROXINE SODIUM 75 MCG PO TABS: ORAL_TABLET | ORAL | 0 refills | Status: DC

## 2016-08-04 ENCOUNTER — Telehealth: Payer: Self-pay

## 2016-08-04 ENCOUNTER — Other Ambulatory Visit: Payer: Self-pay | Admitting: Internal Medicine

## 2016-08-04 DIAGNOSIS — R21 Rash and other nonspecific skin eruption: Secondary | ICD-10-CM

## 2016-08-04 DIAGNOSIS — Z1231 Encounter for screening mammogram for malignant neoplasm of breast: Secondary | ICD-10-CM

## 2016-08-04 NOTE — Telephone Encounter (Signed)
Patient called requesting order for mammogram due to ongoing rash on nipple and breast x 2 months with no improvement.   Please advise if ok to order mammogram or if patient needs to see a specialist (dermatologist)  If mammogram to be ordered please specific diagnosis

## 2016-08-04 NOTE — Telephone Encounter (Signed)
She had mammogram July 2017 that was normal. It would be better for her to see dermatology at this time.

## 2016-08-04 NOTE — Telephone Encounter (Signed)
Referral pending, will try to reach patient later to give a status update of message

## 2016-08-04 NOTE — Telephone Encounter (Signed)
Spoke with female was informed patient not there and call was abruptly ended. I will try to reach patient again later

## 2016-08-04 NOTE — Telephone Encounter (Signed)
Called patient to inform her of status of request, unable to make contact (phone kept ringing), no voicemail set up.  I will try to contact patient again later

## 2016-09-19 ENCOUNTER — Other Ambulatory Visit: Payer: Self-pay | Admitting: *Deleted

## 2016-09-19 ENCOUNTER — Other Ambulatory Visit: Payer: PPO

## 2016-09-19 DIAGNOSIS — I10 Essential (primary) hypertension: Secondary | ICD-10-CM

## 2016-09-19 DIAGNOSIS — E034 Atrophy of thyroid (acquired): Secondary | ICD-10-CM

## 2016-09-19 DIAGNOSIS — E782 Mixed hyperlipidemia: Secondary | ICD-10-CM | POA: Diagnosis not present

## 2016-09-19 LAB — BASIC METABOLIC PANEL WITH GFR
BUN: 17 mg/dL (ref 7–25)
CO2: 23 mmol/L (ref 20–31)
Calcium: 9.5 mg/dL (ref 8.6–10.4)
Chloride: 103 mmol/L (ref 98–110)
Creat: 1.09 mg/dL — ABNORMAL HIGH (ref 0.60–0.93)
GFR, Est African American: 58 mL/min — ABNORMAL LOW (ref >=60)
GFR, Est Non African American: 50 mL/min — ABNORMAL LOW (ref >=60)
Glucose, Bld: 82 mg/dL (ref 65–99)
Potassium: 3.8 mmol/L (ref 3.5–5.3)
Sodium: 140 mmol/L (ref 135–146)

## 2016-09-19 LAB — TSH: TSH: 3.12 mIU/L

## 2016-09-19 LAB — LIPID PANEL
Cholesterol: 198 mg/dL (ref <200)
HDL: 69 mg/dL (ref >50)
LDL Cholesterol: 107 mg/dL — ABNORMAL HIGH (ref <100)
Total CHOL/HDL Ratio: 2.9 Ratio (ref <5.0)
Triglycerides: 110 mg/dL (ref <150)
VLDL: 22 mg/dL (ref <30)

## 2016-09-19 LAB — ALT: ALT: 17 U/L (ref 6–29)

## 2016-09-19 MED ORDER — LEVOTHYROXINE SODIUM 75 MCG PO TABS: ORAL_TABLET | ORAL | 0 refills | Status: DC

## 2016-09-19 MED ORDER — AMLODIPINE BESYLATE 5 MG PO TABS: ORAL_TABLET | ORAL | 0 refills | Status: DC

## 2016-09-19 MED ORDER — PANTOPRAZOLE SODIUM 40 MG PO TBEC: 40.0000 mg | DELAYED_RELEASE_TABLET | Freq: Every day | ORAL | 0 refills | Status: DC

## 2016-09-19 NOTE — Telephone Encounter (Signed)
Patient requested to be sent to local pharmacy. Stated that she no longer wishes to use the mail order service. Faxed to [facility]. Confirmed upcoming appointment with patient.

## 2016-09-21 ENCOUNTER — Ambulatory Visit (INDEPENDENT_AMBULATORY_CARE_PROVIDER_SITE_OTHER): Payer: PPO | Admitting: Internal Medicine

## 2016-09-21 ENCOUNTER — Encounter: Payer: Self-pay | Admitting: Internal Medicine

## 2016-09-21 VITALS — BP 122/68 | HR 69 | Temp 98.0°F | Ht 62.0 in | Wt 120.2 lb

## 2016-09-21 DIAGNOSIS — L249 Irritant contact dermatitis, unspecified cause: Secondary | ICD-10-CM

## 2016-09-21 DIAGNOSIS — E782 Mixed hyperlipidemia: Secondary | ICD-10-CM | POA: Diagnosis not present

## 2016-09-21 DIAGNOSIS — E034 Atrophy of thyroid (acquired): Secondary | ICD-10-CM | POA: Diagnosis not present

## 2016-09-21 DIAGNOSIS — R448 Other symptoms and signs involving general sensations and perceptions: Secondary | ICD-10-CM | POA: Diagnosis not present

## 2016-09-21 DIAGNOSIS — I10 Essential (primary) hypertension: Secondary | ICD-10-CM | POA: Diagnosis not present

## 2016-09-21 DIAGNOSIS — K219 Gastro-esophageal reflux disease without esophagitis: Secondary | ICD-10-CM

## 2016-09-21 NOTE — Patient Instructions (Addendum)
Ok to use magnesium citrate sparingly to cleanse colon.  Continue current medications as ordered  Follow up in 6 mos for CPE/AWV. Fasting labs prior to appt

## 2016-09-21 NOTE — Progress Notes (Signed)
Patient ID: Tina Roberts, female   DOB: [birth year], 76 y.o.   MRN: [id number]    Location:  [facility] Place of Service: OFFICE  Chief Complaint  Patient presents with  . Medical Management of Chronic Issues    6 month routine visit    HPI:  76 yo female seen today for f/u. She has no concerns.   Right breast irritant contact dermatitis - improving with triamcinolone cream  She has hx elevated cholesterol but controls it with diet and exercise. LDL 107; Tchol 124 ; HDL 69  She is taking calcium w D for osteoporosis. Fosamax stopped several mos ago due to ADRs. Never tried prolia injections  BP stable on amlodipine  She has GERD/HH and takes digestive enzymes and protonix. Sx's controlled most times. She takes vinegar occasionally  Hypothyroid - stable on levothyroxine. TSH 3.12; T4 free 1.2  Tongue paresthesias - affects tip of tongue. now using OTC mouth wash with spicy taste which helps. She has seen ENT Dr Harrington Challenger.   Past Medical History:  Diagnosis Date  . Conjunctivitis unspecified   . Disorder of bone and cartilage, unspecified   . GERD (gastroesophageal reflux disease)   . Hepatic lesion   . History of gallstones   . Hyperlipidemia   . Hypertension   . Hypothyroidism   . Kidney stone   . Lacrimal disorder   . Myalgia and myositis, unspecified   . Osteoarthrosis, unspecified whether generalized or localized, unspecified site   . Osteopenia   . Osteoporosis   . Osteoporosis, unspecified   . Other and unspecified hyperlipidemia   . Other dysphagia   . Pain in joint, forearm   . Reflux esophagitis   . Unspecified disorder of kidney and ureter   . Unspecified essential hypertension   . Unspecified hypothyroidism   . Unspecified vitamin D deficiency   . Vitamin D deficiency   . Vitiligo   . Vitiligo     Past Surgical History:  Procedure Laterality Date  . LEG SURGERY     right  . [place]    Patient Care Team: Gildardo Cranker, DO as PCP - General  (Internal Medicine)  Social History   Social History  . Marital status: Married    Spouse name: N/A  . Number of children: 4  . Years of education: N/A   Occupational History  . Not on file.   Social History Main Topics  . Smoking status: Never Smoker  . Smokeless tobacco: Never Used  . Alcohol use No  . Drug use: No  . Sexual activity: No   Other Topics Concern  . Not on file   Social History Narrative  . No narrative on file     reports that she has never smoked. She has never used smokeless tobacco. She reports that she does not drink alcohol or use drugs.  Family History  Problem Relation Age of Onset  . Colon cancer Mother 52  . Diabetes Mother   . Diabetes Sister    Family Status  Relation Status  . Mother Deceased  . Sister Alive  . Father Deceased  . Brother Alive  . Son Alive  . Brother Alive  . Brother Alive  . Son Alive  . Son Alive  . Son Alive     No Known Allergies  Medications: Patient's Medications  New Prescriptions   No medications on file  Previous Medications   AMLODIPINE (NORVASC) 5 MG TABLET    Take one tablet  by mouth once daily   CALCIUM-VITAMIN D (OSCAL WITH D) 500-200 MG-UNIT TABLET    Take 1 tablet by mouth daily with breakfast.   DICLOFENAC SODIUM (VOLTAREN) 1 % GEL    Apply 2 g topically 4 (four) times daily.   DIGESTIVE ENZYMES (ENZYME DIGEST PO)    Take 1 tablet ( 500 mg ) by mouth at meal time  For acid reflux   LEVOTHYROXINE (SYNTHROID, LEVOTHROID) 75 MCG TABLET    Take one tablet by mouth once daily for thyroid   PANTOPRAZOLE (PROTONIX) 40 MG TABLET    Take 1 tablet (40 mg total) by mouth daily.   TRIAMCINOLONE CREAM (KENALOG) 0.1 %    Apply 1 application topically 2 (two) times daily. Apply to right nipple until rash is gone  Modified Medications   No medications on file  Discontinued Medications   No medications on file    Review of Systems  Vitals:   09/21/16 0926  BP: 122/68  Pulse: 69  Temp: 98 F (36.7  C)  TempSrc: Oral  SpO2: 95%  Weight: 120 lb 3.2 oz (54.5 kg)  Height: '5\' 2"'  (1.575 m)   Body mass index is 21.98 kg/m.  Physical Exam  Constitutional: She is oriented to person, place, and time. She appears well-developed.  Frail appearing   HENT:  Mouth/Throat: Oropharynx is clear and moist.  L>R distal tongue redness and change in mucosa. No ulceration. No masses seen. Poor dentition but no signs of secondary infection  Eyes: Pupils are equal, round, and reactive to light. No scleral icterus.  Neck: Neck supple. Carotid bruit is not present. No tracheal deviation present. No thyromegaly present.  Cardiovascular: Normal rate, regular rhythm, normal heart sounds and intact distal pulses.  Exam reveals no gallop and no friction rub.   No murmur heard. No LE edema b/l. no calf TTP.   Pulmonary/Chest: Effort normal and breath sounds normal. No stridor. No respiratory distress. She has no wheezes. She has no rhonchi. She has no rales.    Abdominal: Soft. Bowel sounds are normal. She exhibits no distension and no mass. There is no hepatomegaly. There is no tenderness. There is no rebound and no guarding.  Musculoskeletal: She exhibits edema (right knee swelling).  Lymphadenopathy:       Head (right side): No submental and no submandibular adenopathy present.       Head (left side): No submental and no submandibular adenopathy present.    She has no cervical adenopathy.  Neurological: She is alert and oriented to person, place, and time.  Skin: Skin is warm and dry. Rash (vitiligo) noted.  Psychiatric: She has a normal mood and affect. Her behavior is normal. Thought content normal.     Labs reviewed: Appointment on 09/19/2016  Component Date Value Ref Range Status  . Sodium 09/19/2016 140  135 - 146 mmol/L Final  . Potassium 09/19/2016 3.8  3.5 - 5.3 mmol/L Final  . Chloride 09/19/2016 103  98 - 110 mmol/L Final  . CO2 09/19/2016 23  20 - 31 mmol/L Final  . Glucose, Bld  09/19/2016 82  65 - 99 mg/dL Final  . BUN 09/19/2016 17  7 - 25 mg/dL Final  . Creat 09/19/2016 1.09* 0.60 - 0.93 mg/dL Final   Comment:   For patients > or = 75 years of age: The upper reference limit for Creatinine is approximately 13% higher for people identified as African-American.     . Calcium 09/19/2016 9.5  8.6 - 10.4  mg/dL Final  . GFR, Est African American 09/19/2016 58* >=60 mL/min Final  . GFR, Est Non African American 09/19/2016 50* >=60 mL/min Final  . ALT 09/19/2016 17  6 - 29 U/L Final  . TSH 09/19/2016 3.12  mIU/L Final   Comment:   Reference Range   > or = 20 Years  0.40-4.50   Pregnancy Range First trimester  0.26-2.66 Second trimester 0.55-2.73 Third trimester  0.43-2.91     . Cholesterol 09/19/2016 198  <200 mg/dL Final  . Triglycerides 09/19/2016 110  <150 mg/dL Final  . HDL 09/19/2016 69  >50 mg/dL Final  . Total CHOL/HDL Ratio 09/19/2016 2.9  <5.0 Ratio Final  . VLDL 09/19/2016 22  <30 mg/dL Final  . LDL Cholesterol 09/19/2016 107* <100 mg/dL Final    No results found.   Assessment/Plan   ICD-10-CM   1. Mixed hyperlipidemia E78.2 Lipid Panel  2. Irritant contact dermatitis, unspecified trigger L24.9    improved  3. Essential hypertension, benign I10 CMP with eGFR    CBC with Differential/Platelets    Urinalysis with Reflex Microscopic  4. Hypothyroidism due to acquired atrophy of thyroid E03.4 CMP with eGFR    TSH    T4, Free  5. Paresthesia of tongue R44.8 CMP with eGFR  6. Gastroesophageal reflux disease, esophagitis presence not specified K21.9    Ok to use magnesium citrate sparingly to cleanse colon.  Continue current medications as ordered  Follow up in 6 mos for CPE/AWV. Fasting labs prior to appt  [place]ige[place] S. Perlie Gold  [facility] [address] [place], [place] [address] [phone] Cell (Monday-Friday 8 AM - 5 PM) [phone] After 5 PM and follow prompts

## 2016-10-04 NOTE — Telephone Encounter (Signed)
Call never returned  

## 2016-10-24 ENCOUNTER — Ambulatory Visit
Admission: RE | Admit: 2016-10-24 | Discharge: 2016-10-24 | Disposition: A | Discharge status: Home / Self Care | Payer: PPO | Source: Ambulatory Visit | Attending: Internal Medicine | Admitting: Internal Medicine

## 2016-10-24 DIAGNOSIS — Z1231 Encounter for screening mammogram for malignant neoplasm of breast: Secondary | ICD-10-CM | POA: Diagnosis not present

## 2016-11-22 ENCOUNTER — Other Ambulatory Visit: Payer: PPO

## 2016-11-22 DIAGNOSIS — E782 Mixed hyperlipidemia: Secondary | ICD-10-CM

## 2016-11-22 DIAGNOSIS — I10 Essential (primary) hypertension: Secondary | ICD-10-CM

## 2016-11-22 DIAGNOSIS — R448 Other symptoms and signs involving general sensations and perceptions: Secondary | ICD-10-CM | POA: Diagnosis not present

## 2016-11-22 DIAGNOSIS — E034 Atrophy of thyroid (acquired): Secondary | ICD-10-CM | POA: Diagnosis not present

## 2016-11-22 LAB — T4, FREE: Free T4: 1.3 ng/dL (ref 0.8–1.8)

## 2016-11-22 LAB — CBC WITH DIFFERENTIAL/PLATELET
Basophils Absolute: 46 cells/uL (ref 0–200)
Basophils Relative: 1 %
Eosinophils Absolute: 92 cells/uL (ref 15–500)
Eosinophils Relative: 2 %
HCT: 40.9 % (ref 35.0–45.0)
Hemoglobin: 13.2 g/dL (ref 11.7–15.5)
Lymphocytes Relative: 41 %
Lymphs Abs: 1886 cells/uL (ref 850–3900)
MCH: 27.9 pg (ref 27.0–33.0)
MCHC: 32.3 g/dL (ref 32.0–36.0)
MCV: 86.5 fL (ref 80.0–100.0)
MPV: 9.7 fL (ref 7.5–12.5)
Monocytes Absolute: 276 cells/uL (ref 200–950)
Monocytes Relative: 6 %
Neutro Abs: 2300 cells/uL (ref 1500–7800)
Neutrophils Relative %: 50 %
Platelets: 190 10*3/uL (ref 140–400)
RBC: 4.73 MIL/uL (ref 3.80–5.10)
RDW: 14.1 % (ref 11.0–15.0)
WBC: 4.6 10*3/uL (ref 3.8–10.8)

## 2016-11-22 LAB — TSH: TSH: 1.18 mIU/L

## 2016-11-23 LAB — LIPID PANEL
Cholesterol: 200 mg/dL — ABNORMAL HIGH (ref <200)
HDL: 76 mg/dL (ref >50)
LDL Cholesterol: 106 mg/dL — ABNORMAL HIGH (ref <100)
Total CHOL/HDL Ratio: 2.6 Ratio (ref <5.0)
Triglycerides: 90 mg/dL (ref <150)
VLDL: 18 mg/dL (ref <30)

## 2016-11-23 LAB — URINALYSIS, ROUTINE W REFLEX MICROSCOPIC
Bilirubin Urine: NEGATIVE
Glucose, UA: NEGATIVE
Hgb urine dipstick: NEGATIVE
Ketones, ur: NEGATIVE
Leukocytes, UA: NEGATIVE
Nitrite: NEGATIVE
Protein, ur: NEGATIVE
Specific Gravity, Urine: 1.011 (ref 1.001–1.035)
pH: 7 (ref 5.0–8.0)

## 2016-11-23 LAB — COMPLETE METABOLIC PANEL WITH GFR
ALT: 15 U/L (ref 6–29)
AST: 23 U/L (ref 10–35)
Albumin: 4.3 g/dL (ref 3.6–5.1)
Alkaline Phosphatase: 48 U/L (ref 33–130)
BUN: 15 mg/dL (ref 7–25)
CO2: 21 mmol/L (ref 20–32)
Calcium: 9.5 mg/dL (ref 8.6–10.4)
Chloride: 104 mmol/L (ref 98–110)
Creat: 0.9 mg/dL (ref 0.60–0.93)
GFR, Est African American: 72 mL/min (ref >=60)
GFR, Est Non African American: 63 mL/min (ref >=60)
Glucose, Bld: 101 mg/dL — ABNORMAL HIGH (ref 65–99)
Potassium: 3.6 mmol/L (ref 3.5–5.3)
Sodium: 140 mmol/L (ref 135–146)
Total Bilirubin: 0.5 mg/dL (ref 0.2–1.2)
Total Protein: 7.1 g/dL (ref 6.1–8.1)

## 2016-11-25 ENCOUNTER — Ambulatory Visit (INDEPENDENT_AMBULATORY_CARE_PROVIDER_SITE_OTHER): Payer: PPO

## 2016-11-25 VITALS — BP 122/60 | HR 71 | Temp 98.1°F | Ht 62.0 in | Wt 118.0 lb

## 2016-11-25 DIAGNOSIS — Z0000 Encounter for general adult medical examination without abnormal findings: Secondary | ICD-10-CM | POA: Diagnosis not present

## 2016-11-25 DIAGNOSIS — Z23 Encounter for immunization: Secondary | ICD-10-CM | POA: Diagnosis not present

## 2016-11-25 NOTE — Progress Notes (Signed)
Subjective:   Tina Roberts is a 76 y.o. female who presents for Medicare Annual (Subsequent) preventive examination.  Last AWV-03/16/2016    Objective:     Vitals: BP 122/60 (BP Location: Left Arm, Patient Position: Sitting)   Pulse 71   Temp 98.1 F (36.7 C) (Oral)   Ht 5\' 2"  (1.575 m)   Wt 118 lb (53.5 kg)   SpO2 96%   BMI 21.58 kg/m   Body mass index is 21.58 kg/m.   Tobacco History  Smoking Status  . Never Smoker  Smokeless Tobacco  . Never Used     Counseling given: Not Answered   Past Medical History:  Diagnosis Date  . Conjunctivitis unspecified   . Disorder of bone and cartilage, unspecified   . GERD (gastroesophageal reflux disease)   . Hepatic lesion   . History of gallstones   . Hyperlipidemia   . Hypertension   . Hypothyroidism   . Kidney stone   . Lacrimal disorder   . Myalgia and myositis, unspecified   . Osteoarthrosis, unspecified whether generalized or localized, unspecified site   . Osteopenia   . Osteoporosis   . Osteoporosis, unspecified   . Other and unspecified hyperlipidemia   . Other dysphagia   . Pain in joint, forearm   . Reflux esophagitis   . Unspecified disorder of kidney and ureter   . Unspecified essential hypertension   . Unspecified hypothyroidism   . Unspecified vitamin D deficiency   . Vitamin D deficiency   . Vitiligo   . Vitiligo    Past Surgical History:  Procedure Laterality Date  . LEG SURGERY     right  . TUBAL LIGATION  1973   Family History  Problem Relation Age of Onset  . Colon cancer Mother 67  . Diabetes Mother   . Diabetes Sister    History  Sexual Activity  . Sexual activity: No    Outpatient Encounter Prescriptions as of 11/25/2016  Medication Sig  . amLODipine (NORVASC) 5 MG tablet Take one tablet by mouth once daily  . calcium-vitamin D (OSCAL WITH D) 500-200 MG-UNIT tablet Take 1 tablet by mouth daily with breakfast.  . diclofenac sodium (VOLTAREN) 1 % GEL Apply 2 g topically 4  (four) times daily.  . Digestive Enzymes (ENZYME DIGEST PO) Take 1 tablet ( 500 mg ) by mouth at meal time  For acid reflux  . levothyroxine (SYNTHROID, LEVOTHROID) 75 MCG tablet Take one tablet by mouth once daily for thyroid  . pantoprazole (PROTONIX) 40 MG tablet Take 1 tablet (40 mg total) by mouth daily.  Marland Kitchen triamcinolone cream (KENALOG) 0.1 % Apply 1 application topically 2 (two) times daily. Apply to right nipple until rash is gone   No facility-administered encounter medications on file as of 11/25/2016.     Activities of Daily Living In your present state of health, do you have any difficulty performing the following activities: 11/25/2016 03/23/2016  Hearing? N Y  Vision? Y Y  Comment - -  Difficulty concentrating or making decisions? N N  Walking or climbing stairs? N N  Dressing or bathing? N N  Doing errands, shopping? N [place] and eating ? N -  Using the Toilet? N -  In the past six months, have you accidently leaked urine? N -  Do you have problems with loss of bowel control? N -  Managing your Medications? N -  Managing your Finances? N -  Housekeeping  or managing your Housekeeping? N -  Some recent data might be hidden    Patient Care Team: Gildardo Cranker, DO as PCP - General (Internal Medicine)    Assessment:     Exercise Activities and Dietary recommendations Current Exercise Habits: Home exercise routine, Type of exercise: walking;Other - see comments (aerobics), Time (Minutes): 40, Frequency (Times/Week): 4, Weekly Exercise (Minutes/Week): 160, Intensity: Mild  Goals    . Exercise 3x per week (30 min per time)          Starting 03/16/16, I will maintain my current exercise routine of 3 days per week.     . Maintain Lifestyle          Pt will maintain lifestyle.      Fall Risk Fall Risk  11/25/2016 09/21/2016 03/16/2016 02/10/2016 09/04/2015  Falls in the past year? No No No No No   Depression Screen PHQ 2/9 Scores 11/25/2016  03/16/2016 07/10/2015 01/09/2015  PHQ - 2 Score 0 0 0 0     Cognitive Function MMSE - Mini Mental State Exam 11/25/2016 03/16/2016 01/09/2015  Orientation to time 5 5 5   Orientation to Place 5 5 5   Registration 3 3 3   Attention/ Calculation 0 0 0  Recall 1 3 3   Language- name 2 objects 2 2 2   Language- repeat 1 1 1   Language- follow 3 step command 3 3 3   Language- read & follow direction 1 1 1   Write a sentence 1 1 1   Copy design 0 0 0  Total score 22 24 24         Immunization History  Administered Date(s) Administered  . Influenza Whole 12/03/2012  . Influenza,inj,Quad PF,6+ Mos 01/29/2014, 01/09/2015  . Influenza-Unspecified 01/20/2016  . Pneumococcal Conjugate-13 01/29/2014  . Pneumococcal Polysaccharide-23 11/20/2012   Screening Tests Health Maintenance  Topic Date Due  . INFLUENZA VACCINE  11/02/2016  . TETANUS/TDAP  09/14/2017 (Originally 11/21/1959)  . COLONOSCOPY  01/21/2020  . DEXA SCAN  Completed  . PNA vac Low Risk Adult  Completed      Plan:  I have personally reviewed and addressed the Medicare Annual Wellness questionnaire and have noted the following in the patient's chart:  A. Medical and social history B. Use of alcohol, tobacco or illicit drugs  C. Current medications and supplements D. Functional ability and status E.  Nutritional status F.  Physical activity G. Advance directives H. List of other physicians I.  Hospitalizations, surgeries, and ER visits in previous 12 months J.  [place] to include hearing, vision, cognitive, depression L. Referrals and appointments - none  In addition, I have reviewed and discussed with patient certain preventive protocols, quality metrics, and best practice recommendations. A written personalized care plan for preventive services as well as general preventive health recommendations were provided to patient.  See attached scanned questionnaire for additional information.   Signed,   Rich Reining, RN Nurse Health Advisor   Quick Notes   Health Maintenance: TDAP due     Abnormal Screen: MMSE 22/30. Passed clock drawing     Patient Concerns: C/o phlegm in throat-Mucinex prescription? Rash on R breast is still there-but better than before.     Nurse Concerns: None

## 2016-11-25 NOTE — Patient Instructions (Signed)
Tina Roberts , Thank you for taking time to come for your Medicare Wellness Visit. I appreciate your ongoing commitment to your health goals. Please review the following plan we discussed and let me know if I can assist you in the future.   Screening recommendations/referrals: Colonoscopy excluded, pt age 76 Recommended yearly ophthalmology/optometry visit for glaucoma screening and checkup Recommended yearly dental visit for hygiene and checkup  Vaccinations: Influenza vaccine given. Due 2019 flu season  Pneumococcal vaccine Up to date Tdap vaccine due Shingles vaccine due    Advanced directives: Please bring [place] your copy when you come next week.  Conditions/risks identified: None  Next appointment: Dr. Eulas Post 12/02/16 @8 :45am  Preventive Care 80 Years and Older, Female Preventive care refers to lifestyle choices and visits with your health care provider that can promote health and wellness. What does preventive care include?  A yearly physical exam. This is also called an annual well check.  Dental exams once or twice a year.  Routine eye exams. Ask your health care provider how often you should have your eyes checked.  Personal lifestyle choices, including:  Daily care of your teeth and gums.  Regular physical activity.  Eating a healthy diet.  Avoiding tobacco and drug use.  Limiting alcohol use.  Practicing safe sex.  Taking low doses of aspirin every day.  Taking vitamin and mineral supplements as recommended by your health care provider. What happens during an annual well check? The services and screenings done by your health care provider during your annual well check will depend on your age, overall health, lifestyle risk factors, and family history of disease. Counseling  Your health care provider may ask you questions about your:  Alcohol use.  Tobacco use.  Drug use.  Emotional well-being.  Home and relationship well-being.  Sexual  activity.  Eating habits.  History of falls.  Memory and ability to understand (cognition).  Work and work Statistician. Screening  You may have the following tests or measurements:  Height, weight, and BMI.  Blood pressure.  Lipid and cholesterol levels. These may be checked every 5 years, or more frequently if you are over 26 years old.  Skin check.  Lung cancer screening. You may have this screening every year starting at age 59 if you have a 30-pack-year history of smoking and currently smoke or have quit within the past 15 years.  Fecal occult blood test (FOBT) of the stool. You may have this test every year starting at age 34.  Flexible sigmoidoscopy or colonoscopy. You may have a sigmoidoscopy every 5 years or a colonoscopy every 10 years starting at age 40.  Prostate cancer screening. Recommendations will vary depending on your family history and other risks.  Hepatitis C blood test.  Hepatitis B blood test.  Sexually transmitted disease (STD) testing.  Diabetes screening. This is done by checking your blood sugar (glucose) after you have not eaten for a while (fasting). You may have this done every 1-3 years.  Abdominal aortic aneurysm (AAA) screening. You may need this if you are a current or former smoker.  Osteoporosis. You may be screened starting at age 12 if you are at high risk. Talk with your health care provider about your test results, treatment options, and if necessary, the need for more tests. Vaccines  Your health care provider may recommend certain vaccines, such as:  Influenza vaccine. This is recommended every year.  Tetanus, diphtheria, and acellular pertussis (Tdap, Td) vaccine. You may need a Td  booster every 10 years.  Zoster vaccine. You may need this after age 55.  Pneumococcal 13-valent conjugate (PCV13) vaccine. One dose is recommended after age 47.  Pneumococcal polysaccharide (PPSV23) vaccine. One dose is recommended after age  39. Talk to your health care provider about which screenings and vaccines you need and how often you need them. This information is not intended to replace advice given to you by your health care provider. Make sure you discuss any questions you have with your health care provider. Document Released: 04/17/2015 Document Revised: 12/09/2015 Document Reviewed: 01/20/2015 Elsevier Interactive Patient Education  2017 [employer] Prevention in the Home Falls can cause injuries. They can happen to people of all ages. There are many things you can do to make your home safe and to help prevent falls. What can I do on the outside of my home?  Regularly fix the edges of walkways and driveways and fix any cracks.  Remove anything that might make you trip as you walk through a door, such as a raised step or threshold.  Trim any bushes or trees on the path to your home.  Use bright outdoor lighting.  Clear any walking paths of anything that might make someone trip, such as rocks or tools.  Regularly check to see if handrails are loose or broken. Make sure that both sides of any steps have handrails.  Any raised decks and porches should have guardrails on the edges.  Have any leaves, snow, or ice cleared regularly.  Use sand or salt on walking paths during winter.  Clean up any spills in your garage right away. This includes oil or grease spills. What can I do in the bathroom?  Use night lights.  Install grab bars by the toilet and in the tub and shower. Do not use towel bars as grab bars.  Use non-skid mats or decals in the tub or shower.  If you need to sit down in the shower, use a plastic, non-slip stool.  Keep the floor dry. Clean up any water that spills on the floor as soon as it happens.  Remove soap buildup in the tub or shower regularly.  Attach bath mats securely with double-sided non-slip rug tape.  Do not have throw rugs and other things on the floor that can make  you trip. What can I do in the bedroom?  Use night lights.  Make sure that you have a light by your bed that is easy to reach.  Do not use any sheets or blankets that are too big for your bed. They should not hang down onto the floor.  Have a firm chair that has side arms. You can use this for support while you get dressed.  Do not have throw rugs and other things on the floor that can make you trip. What can I do in the kitchen?  Clean up any spills right away.  Avoid walking on wet floors.  Keep items that you use a lot in easy-to-reach places.  If you need to reach something above you, use a strong step stool that has a grab bar.  Keep electrical cords out of the way.  Do not use floor polish or wax that makes floors slippery. If you must use wax, use non-skid floor wax.  Do not have throw rugs and other things on the floor that can make you trip. What can I do with my stairs?  Do not leave any items on the stairs.  Make sure that there are handrails on both sides of the stairs and use them. Fix handrails that are broken or loose. Make sure that handrails are as long as the stairways.  Check any carpeting to make sure that it is firmly attached to the stairs. Fix any carpet that is loose or worn.  Avoid having throw rugs at the top or bottom of the stairs. If you do have throw rugs, attach them to the floor with carpet tape.  Make sure that you have a light switch at the top of the stairs and the bottom of the stairs. If you do not have them, ask someone to add them for you. What else can I do to help prevent falls?  Wear shoes that:  Do not have high heels.  Have rubber bottoms.  Are comfortable and fit you well.  Are closed at the toe. Do not wear sandals.  If you use a stepladder:  Make sure that it is fully opened. Do not climb a closed stepladder.  Make sure that both sides of the stepladder are locked into place.  Ask someone to hold it for you, if  possible.  Clearly mark and make sure that you can see:  Any grab bars or handrails.  First and last steps.  Where the edge of each step is.  Use tools that help you move around (mobility aids) if they are needed. These include:  Canes.  Walkers.  Scooters.  Crutches.  Turn on the lights when you go into a dark area. Replace any light bulbs as soon as they burn out.  Set up your furniture so you have a clear path. Avoid moving your furniture around.  If any of your floors are uneven, fix them.  If there are any pets around you, be aware of where they are.  Review your medicines with your doctor. Some medicines can make you feel dizzy. This can increase your chance of falling. Ask your doctor what other things that you can do to help prevent falls. This information is not intended to replace advice given to you by your health care provider. Make sure you discuss any questions you have with your health care provider. Document Released: 01/15/2009 Document Revised: 08/27/2015 Document Reviewed: 04/25/2014 Elsevier Interactive Patient Education  2017 [employer].

## 2016-12-02 ENCOUNTER — Encounter: Payer: Self-pay | Admitting: Internal Medicine

## 2016-12-02 ENCOUNTER — Ambulatory Visit (INDEPENDENT_AMBULATORY_CARE_PROVIDER_SITE_OTHER): Payer: PPO | Admitting: Internal Medicine

## 2016-12-02 VITALS — BP 118/66 | HR 61 | Temp 98.1°F | Ht 62.0 in | Wt 117.2 lb

## 2016-12-02 DIAGNOSIS — I10 Essential (primary) hypertension: Secondary | ICD-10-CM | POA: Diagnosis not present

## 2016-12-02 DIAGNOSIS — K219 Gastro-esophageal reflux disease without esophagitis: Secondary | ICD-10-CM | POA: Diagnosis not present

## 2016-12-02 DIAGNOSIS — E782 Mixed hyperlipidemia: Secondary | ICD-10-CM

## 2016-12-02 DIAGNOSIS — E034 Atrophy of thyroid (acquired): Secondary | ICD-10-CM | POA: Diagnosis not present

## 2016-12-02 NOTE — Progress Notes (Signed)
Patient ID: Tina Roberts, female   DOB: 1941-03-01, 76 y.o.   MRN: [id number]    Location:  [facility] Place of Service: OFFICE  Chief Complaint  Patient presents with  . Medical Management of Chronic Issues    2 month follow-up, discuss labs (copy printed)   . Immunizations    RX was given at last visit for TDaP and Shingrix   . Medication Refill    No refills needed     HPI:  76 yo female seen today for f/u. She c/o severe acid reflux. She admits to eating prior to going to bed and also takes PPI 20 minutes before meal. No N/V. No increased gas. She has no other concerns. She was unable to afford Shingrix or Tdap and never rec'd the injection  Right breast irritant contact dermatitis - improving slowly with triamcinolone cream  Hyperlipidemia - diet controlled. LDL 106; Tchol 200 ; HDL 76  Osteoporosis - takes calcium w D. Fosamax stopped several mos ago due to ADRs. Never tried prolia injections  HTN - BP stable on amlodipine  GERD/HH - uncontrolled. takes digestive enzymes and prn protonix. She takes vinegar occasionally  Hypothyroidism - stable on levothyroxine. TSH 1.18; T4 free 1.3  Tongue paresthesias - stable. affects tip of tongue. Uses OTC mouth wash with spicy taste which helps. She has seen ENT Dr Harrington Challenger in the past.   Joint pain - stable on voltaren gel  Past Medical History:  Diagnosis Date  . Conjunctivitis unspecified   . Disorder of bone and cartilage, unspecified   . GERD (gastroesophageal reflux disease)   . Hepatic lesion   . History of gallstones   . Hyperlipidemia   . Hypertension   . Hypothyroidism   . Kidney stone   . Lacrimal disorder   . Myalgia and myositis, unspecified   . Osteoarthrosis, unspecified whether generalized or localized, unspecified site   . Osteopenia   . Osteoporosis   . Osteoporosis, unspecified   . Other and unspecified hyperlipidemia   . Other dysphagia   . Pain in joint, forearm   . Reflux esophagitis   . Unspecified  disorder of kidney and ureter   . Unspecified essential hypertension   . Unspecified hypothyroidism   . Unspecified vitamin D deficiency   . Vitamin D deficiency   . Vitiligo   . Vitiligo     Past Surgical History:  Procedure Laterality Date  . LEG SURGERY     right  . [place]    Patient Care Team: Gildardo Cranker, DO as PCP - General (Internal Medicine)  Social History   Social History  . Marital status: Married    Spouse name: N/A  . Number of children: 4  . Years of education: N/A   Occupational History  . Not on file.   Social History Main Topics  . Smoking status: Never Smoker  . Smokeless tobacco: Never Used  . Alcohol use No     Comment: occasional drink  . Drug use: No  . Sexual activity: No   Other Topics Concern  . Not on file   Social History Narrative  . No narrative on file     reports that she has never smoked. She has never used smokeless tobacco. She reports that she does not drink alcohol or use drugs.  Family History  Problem Relation Age of Onset  . Colon cancer Mother 7  . Diabetes Mother   . Diabetes Sister    Family Status  Relation Status  . Mother Deceased  . Sister Alive  . Father Deceased  . Brother Alive  . Son Alive  . Brother Alive  . Brother Alive  . Son Alive  . Son Alive  . Son Alive     No Known Allergies  Medications: Patient's Medications  New Prescriptions   No medications on file  Previous Medications   AMLODIPINE (NORVASC) 5 MG TABLET    Take one tablet by mouth once daily   CALCIUM-VITAMIN D (OSCAL WITH D) 500-200 MG-UNIT TABLET    Take 1 tablet by mouth daily with breakfast.   DICLOFENAC SODIUM (VOLTAREN) 1 % GEL    Apply 2 g topically 4 (four) times daily.   DIGESTIVE ENZYMES (ENZYME DIGEST PO)    Take 1 tablet ( 500 mg ) by mouth at meal time  For acid reflux   LEVOTHYROXINE (SYNTHROID, LEVOTHROID) 75 MCG TABLET    Take one tablet by mouth once daily for thyroid   PANTOPRAZOLE  (PROTONIX) 40 MG TABLET    Take 1 tablet (40 mg total) by mouth daily.   TRIAMCINOLONE CREAM (KENALOG) 0.1 %    Apply 1 application topically 2 (two) times daily. Apply to right nipple until rash is gone  Modified Medications   No medications on file  Discontinued Medications   No medications on file    Review of Systems  HENT: Negative for trouble swallowing.   Gastrointestinal: Positive for abdominal distention. Negative for abdominal pain.  Neurological: Positive for numbness.  All other systems reviewed and are negative.   Vitals:   12/02/16 0845  BP: 118/66  Pulse: 61  Temp: 98.1 F (36.7 C)  TempSrc: Oral  SpO2: 97%  Weight: 117 lb 3.2 oz (53.2 kg)  Height: _0  (1.575 m)   Body mass index is 21.44 kg/m.  Physical Exam  Constitutional: She is oriented to person, place, and time. She appears well-developed and well-nourished.  HENT:  Mouth/Throat: Oropharynx is clear and moist. No oropharyngeal exudate.  MMM; no oral thrush  Eyes: Pupils are equal, round, and reactive to light. No scleral icterus.  Neck: Neck supple. Carotid bruit is not present. No tracheal deviation present. No thyromegaly present.  Cardiovascular: Normal rate, regular rhythm, normal heart sounds and intact distal pulses.  Exam reveals no gallop and no friction rub.   No murmur heard. No LE edema b/l. no calf TTP.   Pulmonary/Chest: Effort normal and breath sounds normal. No stridor. No respiratory distress. She has no wheezes. She has no rales.  Abdominal: Soft. Normal appearance and bowel sounds are normal. She exhibits distension. She exhibits no mass. There is no hepatomegaly. There is tenderness. There is no rigidity, no rebound and no guarding. No hernia.  Musculoskeletal: She exhibits edema.  Lymphadenopathy:    She has no cervical adenopathy.  Neurological: She is alert and oriented to person, place, and time.  Skin: Skin is warm and dry. No rash noted.  (+) vitiligo  Psychiatric: She  has a normal mood and affect. Her behavior is normal. Judgment and thought content normal.     Labs reviewed: Appointment on 11/22/2016  Component Date Value Ref Range Status  . Sodium 11/22/2016 140  135 - 146 mmol/L Final  . Potassium 11/22/2016 3.6  3.5 - 5.3 mmol/L Final  . Chloride 11/22/2016 104  98 - 110 mmol/L Final  . CO2 11/22/2016 21  20 - 32 mmol/L Final   Comment: ** Please note change in reference range(s). **     .  Glucose, Bld 11/22/2016 101* 65 - 99 mg/dL Final  . BUN 11/22/2016 15  7 - 25 mg/dL Final  . Creat 11/22/2016 0.90  0.60 - 0.93 mg/dL Final   Comment:   For patients > or = 75 years of age: The upper reference limit for Creatinine is approximately 13% higher for people identified as African-American.     . Total Bilirubin 11/22/2016 0.5  0.2 - 1.2 mg/dL Final  . Alkaline Phosphatase 11/22/2016 48  33 - 130 U/L Final  . AST 11/22/2016 23  10 - 35 U/L Final  . ALT 11/22/2016 15  6 - 29 U/L Final  . Total Protein 11/22/2016 7.1  6.1 - 8.1 g/dL Final  . Albumin 11/22/2016 4.3  3.6 - 5.1 g/dL Final  . Calcium 11/22/2016 9.5  8.6 - 10.4 mg/dL Final  . GFR, Est African American 11/22/2016 72  >=60 mL/min Final  . GFR, Est Non African American 11/22/2016 63  >=60 mL/min Final  . Cholesterol 11/22/2016 200* <200 mg/dL Final  . Triglycerides 11/22/2016 90  <150 mg/dL Final  . HDL 11/22/2016 76  >50 mg/dL Final  . Total CHOL/HDL Ratio 11/22/2016 2.6  <5.0 Ratio Final  . VLDL 11/22/2016 18  <30 mg/dL Final  . LDL Cholesterol 11/22/2016 106* <100 mg/dL Final  . TSH 11/22/2016 1.18  mIU/L Final   Comment:   Reference Range   > or = 20 Years  0.40-4.50   Pregnancy Range First trimester  0.26-2.66 Second trimester 0.55-2.73 Third trimester  0.43-2.91     . Free T4 11/22/2016 1.3  0.8 - 1.8 ng/dL Final  . WBC 11/22/2016 4.6  3.8 - 10.8 K/uL Final  . RBC 11/22/2016 4.73  3.80 - 5.10 MIL/uL Final  . Hemoglobin 11/22/2016 13.2  11.7 - 15.5 g/dL Final  . HCT  11/22/2016 40.9  35.0 - 45.0 % Final  . MCV 11/22/2016 86.5  80.0 - 100.0 fL Final  . MCH 11/22/2016 27.9  27.0 - 33.0 pg Final  . MCHC 11/22/2016 32.3  32.0 - 36.0 g/dL Final  . RDW 11/22/2016 14.1  11.0 - 15.0 % Final  . Platelets 11/22/2016 190  140 - 400 K/uL Final  . MPV 11/22/2016 9.7  7.5 - 12.5 fL Final  . Neutro Abs 11/22/2016 2300  1,500 - 7,800 cells/uL Final  . Lymphs Abs 11/22/2016 1886  850 - 3,900 cells/uL Final  . Monocytes Absolute 11/22/2016 276  200 - 950 cells/uL Final  . Eosinophils Absolute 11/22/2016 92  15 - 500 cells/uL Final  . Basophils Absolute 11/22/2016 46  0 - 200 cells/uL Final  . Neutrophils Relative % 11/22/2016 50  % Final  . Lymphocytes Relative 11/22/2016 41  % Final  . Monocytes Relative 11/22/2016 6  % Final  . Eosinophils Relative 11/22/2016 2  % Final  . Basophils Relative 11/22/2016 1  % Final  . Smear Review 11/22/2016 Criteria for review not met   Final  . Color, Urine 11/22/2016 YELLOW  YELLOW Final  . APPearance 11/22/2016 CLEAR  CLEAR Final  . Specific Gravity, Urine 11/22/2016 1.011  1.001 - 1.035 Final  . pH 11/22/2016 7.0  5.0 - 8.0 Final  . Glucose, UA 11/22/2016 NEGATIVE  NEGATIVE Final  . Bilirubin Urine 11/22/2016 NEGATIVE  NEGATIVE Final  . Ketones, ur 11/22/2016 NEGATIVE  NEGATIVE Final  . Hgb urine dipstick 11/22/2016 NEGATIVE  NEGATIVE Final  . Protein, ur 11/22/2016 NEGATIVE  NEGATIVE Final  . Nitrite 11/22/2016 NEGATIVE  NEGATIVE Final  .  Leukocytes, UA 11/22/2016 NEGATIVE  NEGATIVE Final  Appointment on 09/19/2016  Component Date Value Ref Range Status  . Sodium 09/19/2016 140  135 - 146 mmol/L Final  . Potassium 09/19/2016 3.8  3.5 - 5.3 mmol/L Final  . Chloride 09/19/2016 103  98 - 110 mmol/L Final  . CO2 09/19/2016 23  20 - 31 mmol/L Final  . Glucose, Bld 09/19/2016 82  65 - 99 mg/dL Final  . BUN 09/19/2016 17  7 - 25 mg/dL Final  . Creat 09/19/2016 1.09* 0.60 - 0.93 mg/dL Final   Comment:   For patients > or = 75  years of age: The upper reference limit for Creatinine is approximately 13% higher for people identified as African-American.     . Calcium 09/19/2016 9.5  8.6 - 10.4 mg/dL Final  . GFR, Est African American 09/19/2016 58* >=60 mL/min Final  . GFR, Est Non African American 09/19/2016 50* >=60 mL/min Final  . ALT 09/19/2016 17  6 - 29 U/L Final  . TSH 09/19/2016 3.12  mIU/L Final   Comment:   Reference Range   > or = 20 Years  0.40-4.50   Pregnancy Range First trimester  0.26-2.66 Second trimester 0.55-2.73 Third trimester  0.43-2.91     . Cholesterol 09/19/2016 198  <200 mg/dL Final  . Triglycerides 09/19/2016 110  <150 mg/dL Final  . HDL 09/19/2016 69  >50 mg/dL Final  . Total CHOL/HDL Ratio 09/19/2016 2.9  <5.0 Ratio Final  . VLDL 09/19/2016 22  <30 mg/dL Final  . LDL Cholesterol 09/19/2016 107* <100 mg/dL Final    No results found.   Assessment/Plan   ICD-10-CM   1. Gastroesophageal reflux disease, esophagitis presence not specified - failing to change as expected K21.9   2. Hypothyroidism due to acquired atrophy of thyroid E03.4   3. Mixed hyperlipidemia E78.2   4. Essential hypertension, benign I10    Discussed importance of taking PPT properly and take it on a consistent basis  She already had her flu shot this season  Continue current medications as ordered  Take protonix at least 1 hr prior to meal  Do not eat at least 2 hours before bedtime  Follow up in 4 mos for AWV/EV. Fasting labs on day of appt (bmp, alt, tsh, lipid panel)  Handout on heartburn given  Adreana Coull S. Perlie Gold  [facility] [address] [place], [place] [address] [phone] Cell (Monday-Friday 8 AM - 5 PM) [phone] After 5 PM and follow prompts

## 2016-12-02 NOTE — Patient Instructions (Signed)
Continue current medications as ordered  Take protonix at least 1 hr prior to meal  Do not eat at least 2 hours before bedtime  Follow up in 4 mos for AWV/EV. Fasting labs on day of appt   Heartburn Heartburn is a type of pain or discomfort that can happen in the throat or chest. It is often described as a burning pain. It may also cause a bad taste in the mouth. Heartburn may feel worse when you lie down or bend over. It may be caused by stomach contents that move back up (reflux) into the tube that connects the mouth with the stomach (esophagus). Follow these instructions at home: Take these actions to lessen your discomfort and to help avoid problems. Diet  Follow a diet as told by your doctor. You may need to avoid foods and drinks such as: ? Coffee and tea (with or without caffeine). ? Drinks that contain alcohol. ? Energy drinks and sports drinks. ? Carbonated drinks or sodas. ? Chocolate and cocoa. ? Peppermint and mint flavorings. ? Garlic and onions. ? Horseradish. ? Spicy and acidic foods, such as peppers, chili powder, curry powder, vinegar, hot sauces, and BBQ sauce. ? Citrus fruit juices and citrus fruits, such as oranges, lemons, and limes. ? Tomato-based foods, such as red sauce, chili, salsa, and pizza with red sauce. ? Fried and fatty foods, such as donuts, french fries, potato chips, and high-fat dressings. ? High-fat meats, such as hot dogs, rib eye steak, sausage, ham, and bacon. ? High-fat dairy items, such as whole milk, butter, and cream cheese.  Eat small meals often. Avoid eating large meals.  Avoid drinking large amounts of liquid with your meals.  Avoid eating meals during the 2-3 hours before bedtime.  Avoid lying down right after you eat.  Do not exercise right after you eat. General instructions  Pay attention to any changes in your symptoms.  Take over-the-counter and prescription medicines only as told by your doctor. Do not take aspirin,  ibuprofen, or other NSAIDs unless your doctor says it is okay.  Do not use any tobacco products, including cigarettes, chewing tobacco, and e-cigarettes. If you need help quitting, ask your doctor.  Wear loose clothes. Do not wear anything tight around your waist.  Raise (elevate) the head of your bed about 6 inches (15 cm).  Try to lower your stress. If you need help doing this, ask your doctor.  If you are overweight, lose an amount of weight that is healthy for you. Ask your doctor about a safe weight loss goal.  Keep all follow-up visits as told by your doctor. This is important. Contact a doctor if:  You have new symptoms.  You lose weight and you do not know why it is happening.  You have trouble swallowing, or it hurts to swallow.  You have wheezing or a cough that keeps happening.  Your symptoms do not get better with treatment.  You have heartburn often for more than two weeks. Get help right away if:  You have pain in your arms, neck, jaw, teeth, or back.  You feel sweaty, dizzy, or light-headed.  You have chest pain or shortness of breath.  You throw up (vomit) and your throw up looks like blood or coffee grounds.  Your poop (stool) is bloody or black. This information is not intended to replace advice given to you by your health care provider. Make sure you discuss any questions you have with your health care provider.  Document Released: 12/01/2010 Document Revised: 08/27/2015 Document Reviewed: 07/16/2014 Elsevier Interactive Patient Education  [employer].

## 2016-12-15 ENCOUNTER — Other Ambulatory Visit: Payer: Self-pay | Admitting: Internal Medicine

## 2016-12-23 ENCOUNTER — Other Ambulatory Visit: Payer: Self-pay | Admitting: Internal Medicine

## 2016-12-26 ENCOUNTER — Other Ambulatory Visit: Payer: Self-pay | Admitting: Internal Medicine

## 2016-12-26 DIAGNOSIS — H524 Presbyopia: Secondary | ICD-10-CM | POA: Diagnosis not present

## 2017-04-05 ENCOUNTER — Ambulatory Visit (INDEPENDENT_AMBULATORY_CARE_PROVIDER_SITE_OTHER): Payer: Medicare Other | Admitting: Internal Medicine

## 2017-04-05 ENCOUNTER — Encounter: Payer: Self-pay | Admitting: Internal Medicine

## 2017-04-05 VITALS — BP 122/74 | HR 67 | Temp 98.0°F | Ht 62.0 in | Wt 123.0 lb

## 2017-04-05 DIAGNOSIS — E034 Atrophy of thyroid (acquired): Secondary | ICD-10-CM | POA: Diagnosis not present

## 2017-04-05 DIAGNOSIS — E782 Mixed hyperlipidemia: Secondary | ICD-10-CM | POA: Diagnosis not present

## 2017-04-05 DIAGNOSIS — M810 Age-related osteoporosis without current pathological fracture: Secondary | ICD-10-CM | POA: Diagnosis not present

## 2017-04-05 DIAGNOSIS — E2839 Other primary ovarian failure: Secondary | ICD-10-CM | POA: Diagnosis not present

## 2017-04-05 DIAGNOSIS — K219 Gastro-esophageal reflux disease without esophagitis: Secondary | ICD-10-CM | POA: Diagnosis not present

## 2017-04-05 DIAGNOSIS — R413 Other amnesia: Secondary | ICD-10-CM

## 2017-04-05 DIAGNOSIS — I10 Essential (primary) hypertension: Secondary | ICD-10-CM

## 2017-04-05 DIAGNOSIS — Z0000 Encounter for general adult medical examination without abnormal findings: Secondary | ICD-10-CM

## 2017-04-05 DIAGNOSIS — Z1211 Encounter for screening for malignant neoplasm of colon: Secondary | ICD-10-CM

## 2017-04-05 DIAGNOSIS — R448 Other symptoms and signs involving general sensations and perceptions: Secondary | ICD-10-CM | POA: Diagnosis not present

## 2017-04-05 LAB — BASIC METABOLIC PANEL WITH GFR
BUN: 15 mg/dL (ref 7–25)
CO2: 28 mmol/L (ref 20–32)
Calcium: 10.2 mg/dL (ref 8.6–10.4)
Chloride: 101 mmol/L (ref 98–110)
Creat: 0.88 mg/dL (ref 0.60–0.93)
GFR, Est African American: 74 mL/min/{1.73_m2} (ref >=60)
GFR, Est Non African American: 64 mL/min/{1.73_m2} (ref >=60)
Glucose, Bld: 86 mg/dL (ref 65–99)
Potassium: 3.8 mmol/L (ref 3.5–5.3)
Sodium: 139 mmol/L (ref 135–146)

## 2017-04-05 LAB — LIPID PANEL
Cholesterol: 218 mg/dL — ABNORMAL HIGH (ref <200)
HDL: 85 mg/dL (ref >50)
LDL Cholesterol (Calc): 112 mg/dL (calc) — ABNORMAL HIGH
Non-HDL Cholesterol (Calc): 133 mg/dL (calc) — ABNORMAL HIGH (ref <130)
Total CHOL/HDL Ratio: 2.6 (calc) (ref <5.0)
Triglycerides: 107 mg/dL (ref <150)

## 2017-04-05 LAB — TSH: TSH: 1.07 mIU/L (ref 0.40–4.50)

## 2017-04-05 LAB — ALT: ALT: 18 U/L (ref 6–29)

## 2017-04-05 MED ORDER — GABAPENTIN 100 MG PO CAPS: 100.0000 mg | ORAL_CAPSULE | Freq: Every day | ORAL | 3 refills | Status: DC

## 2017-04-05 NOTE — Progress Notes (Signed)
Patient ID: Tina Roberts, female   DOB: [birth year], 77 y.o.   MRN: [id number]   Location:  [facility]  Place of Service:  OFFICE  Provider: Arletha Grippe, DO  Patient Care Team: Gildardo Cranker, DO as PCP - General (Internal Medicine)  Extended Emergency Contact Information Primary Emergency Contact: Andrew,Geraldine Address: [place], [place] Phone: [phone] Relation: Sister Secondary Emergency Contact: Falkner,Lonnie Address: [address]           [place], [place] [address] [place] of [place] Phone: [phone] Relation: Son  Code Status: FULL CODE Goals of Care: Advanced Directive information Advanced Directives 04/05/2017  Does Patient Have a Medical Advance Directive? Yes  Type of Paramedic of [place];Living will  Does patient want to make changes to medical advance directive? No - Patient declined  Copy of [place] in Chart? Yes  Would patient like information on creating a medical advance directive? -     Chief Complaint  Patient presents with  . Annual Exam    Yearly Exam  . MMSE    22/30 Passed Clock Drawing    HPI: Patient is a 77 y.o. female seen in today for comprehensive exam. She completed her AWV on 11/25/16.  MMSE 22/30. She has tongue paresthesias that are worse than usual. She has seen ENT in the past. She continues to struggle with significant acid reflux and feels that it is worse at proximal neck.  Right breast irritant contact dermatitis - resolved with triamcinolone cream  Hyperlipidemia - diet controlled. LDL 106; Tchol 200 ; HDL 76  Osteoporosis - takes calcium w D. Fosamax stopped several mos ago due to ADRs. Never tried prolia injections. Last DXA in 2016  HTN - controlled on amlodipine  GERD/HH/LPR - uncontrolled overall. takes digestive enzymes qAC and daily protonix. Last EGD in 2015 showed small hiatal hernia but no other abnormality    Hypothyroidism - stable on levothyroxine. TSH 1.18; T4 free 1.3  Tongue paresthesias - stable. affects tip of tongue. Uses OTC mouth wash with spicy taste which does not help as much. She has seen ENT Dr Sallee Provencal in the past. She was told she has burning tongue syndrome and to dip tongue into 1 gtt Tabasco sauce + 8 oz water TID and increase gtts to max 3 gtts TID. She has not tried that in a while.   Joint pain - stable with voltaren gel    Depression screen [facility] 2/9 04/05/2017 11/25/2016 03/16/2016 07/10/2015 01/09/2015  Decreased Interest 0 0 0 0 0  Down, Depressed, Hopeless 0 0 0 0 0  PHQ - 2 Score 0 0 0 0 0    Fall Risk  04/05/2017 12/02/2016 11/25/2016 09/21/2016 03/16/2016  Falls in the past year? Yes No No No No  Number falls in past yr: 1 - - - -  Injury with Fall? No - - - -   MMSE - Mini Mental State Exam 11/25/2016 03/16/2016 01/09/2015  Orientation to time '5 5 5  ' Orientation to Place '5 5 5  ' Registration '3 3 3  ' Attention/ Calculation 0 0 0  Recall '1 3 3  ' Language- name 2 objects '2 2 2  ' Language- repeat '1 1 1  ' Language- follow 3 step command '3 3 3  ' Language- read & follow direction '1 1 1  ' Write a sentence '1 1 1  ' Copy design 0 0 0  Total score '22 24 24     ' Health Maintenance  Topic Date Due  . TETANUS/TDAP  09/14/2017 (Originally 11/21/1959)  . COLONOSCOPY  01/21/2020  . INFLUENZA VACCINE  Completed  . DEXA SCAN  Completed  . PNA vac Low Risk Adult  Completed     Past Medical History:  Diagnosis Date  . Conjunctivitis unspecified   . Disorder of bone and cartilage, unspecified   . GERD (gastroesophageal reflux disease)   . Hepatic lesion   . History of gallstones   . Hyperlipidemia   . Hypertension   . Hypothyroidism   . Kidney stone   . Lacrimal disorder   . Myalgia and myositis, unspecified   . Osteoarthrosis, unspecified whether generalized or localized, unspecified site   . Osteopenia   . Osteoporosis   . Osteoporosis, unspecified   . Other and  unspecified hyperlipidemia   . Other dysphagia   . Pain in joint, forearm   . Reflux esophagitis   . Unspecified disorder of kidney and ureter   . Unspecified essential hypertension   . Unspecified hypothyroidism   . Unspecified vitamin D deficiency   . Vitamin D deficiency   . Vitiligo   . Vitiligo     Past Surgical History:  Procedure Laterality Date  . LEG SURGERY     right  . TUBAL LIGATION  1973    Family History  Problem Relation Age of Onset  . Colon cancer Mother 84  . Diabetes Mother   . Diabetes Sister    Family Status  Relation Name Status  . Mother Mekaila Tarnow Deceased  . Sister Mia Creek  . Father Atleigh Gruen Deceased  . Brother Technical brewer  . Son Rosezena Sensor Alive  . Brother [employer]  . Brother [employer]  . Son Lillie [place]  . Son [place]  . Son Earlie Server    Social History   Socioeconomic History  . Marital status: Married    Spouse name: Not on file  . Number of children: 4  . Years of education: Not on file  . Highest education level: Not on file  Social Needs  . Financial resource strain: Not on file  . Food insecurity - worry: Not on file  . Food insecurity - inability: Not on file  . Transportation needs - medical: Not on file  . Transportation needs - non-medical: Not on file  Occupational History  . Not on file  Tobacco Use  . Smoking status: Never Smoker  . Smokeless tobacco: Never Used  Substance and Sexual Activity  . Alcohol use: No    Alcohol/week: 0.0 oz    Comment: occasional drink  . Drug use: No  . Sexual activity: No  Other Topics Concern  . Not on file  Social History Narrative  . Not on file    No Known Allergies  Allergies as of 04/05/2017   No Known Allergies     Medication List        Accurate as of 04/05/17 10:06 AM. Always use your most recent med list.          amLODipine 5 MG tablet Commonly known as:  NORVASC TAKE 1 TABLET BY MOUTH EVERY DAY   calcium-vitamin  D 500-200 MG-UNIT tablet Commonly known as:  OSCAL WITH D Take 1 tablet by mouth daily with breakfast.   diclofenac sodium 1 % Gel Commonly known as:  VOLTAREN Apply 2 g topically 4 (four) times daily.  ENZYME DIGEST PO Take 1 tablet ( 500 mg ) by mouth at meal time  For acid reflux   levothyroxine 75 MCG tablet Commonly known as:  SYNTHROID, LEVOTHROID TAKE ONE TABLET BY MOUTH ONCE DAILY FOR THYROID   pantoprazole 40 MG tablet Commonly known as:  PROTONIX TAKE 1 TABLET BY MOUTH EVERY DAY   triamcinolone cream 0.1 % Commonly known as:  KENALOG Apply 1 application topically 2 (two) times daily. Apply to right nipple until rash is gone        Review of Systems:  Review of Systems  Musculoskeletal: Positive for arthralgias.  Neurological: Positive for numbness.  All other systems reviewed and are negative.   Physical Exam: Vitals:   04/05/17 0951  BP: 122/74  Pulse: 67  Temp: 98 F (36.7 C)  TempSrc: Oral  SpO2: 96%  Weight: 123 lb (55.8 kg)  Height: '5\' 2"'  (1.575 m)   Body mass index is 22.5 kg/m. Physical Exam  Constitutional: She is oriented to person, place, and time. She appears well-developed and well-nourished. No distress.  HENT:  Head: Normocephalic and atraumatic.  Right Ear: External ear normal.  Left Ear: External ear normal.  Mouth/Throat: Oropharynx is clear and moist. No oropharyngeal exudate.  MMM; no oral thrush; no tongue lesions  Eyes: EOM are normal. Pupils are equal, round, and reactive to light. No scleral icterus.  Neck: Normal range of motion. Neck supple. Carotid bruit is not present. No tracheal deviation present. No thyromegaly present.  Cardiovascular: Normal rate, regular rhythm and intact distal pulses. Exam reveals no gallop and no friction rub.  No murmur heard. No LE edema b/l. No calf TTP  Pulmonary/Chest: Effort normal and breath sounds normal. No respiratory distress. She has no decreased breath sounds. She has no wheezes.  She has no rhonchi. She has no rales. She exhibits no tenderness. Right breast exhibits no inverted nipple, no mass, no nipple discharge, no skin change and no tenderness. Left breast exhibits no inverted nipple, no mass, no nipple discharge, no skin change and no tenderness. Breasts are symmetrical.  No rhonchi  Abdominal: Soft. Bowel sounds are normal. She exhibits no distension and no mass. There is no hepatosplenomegaly or hepatomegaly. There is no tenderness. There is no rebound and no guarding. No hernia.  Musculoskeletal: She exhibits edema (small joints). She exhibits no deformity.  Lymphadenopathy:    She has no cervical adenopathy.  Neurological: She is alert and oriented to person, place, and time. She has normal reflexes.  Skin: Skin is warm and dry. No rash noted.  (+) vitiligo  Psychiatric: She has a normal mood and affect. Her behavior is normal. Judgment and thought content normal.  Vitals reviewed.   Labs reviewed:  Basic Metabolic Panel: Recent Labs    09/19/16 0836 11/22/16 0959  NA 140 140  K 3.8 3.6  CL 103 104  CO2 23 21  GLUCOSE 82 101*  BUN 17 15  CREATININE 1.09* 0.90  CALCIUM 9.5 9.5  TSH 3.12 1.18   Liver Function Tests: Recent Labs    09/19/16 0836 11/22/16 0959  AST  --  23  ALT 17 15  ALKPHOS  --  48  BILITOT  --  0.5  PROT  --  7.1  ALBUMIN  --  4.3   No results for input(s): LIPASE, AMYLASE in the last 8760 hours. No results for input(s): AMMONIA in the last 8760 hours. CBC: Recent Labs    11/22/16 0959  WBC 4.6  NEUTROABS  2,300  HGB 13.2  HCT 40.9  MCV 86.5  PLT 190   Lipid Panel: Recent Labs    09/19/16 0836 11/22/16 0959  CHOL 198 200*  HDL 69 76  LDLCALC 107* 106*  TRIG 110 90  CHOLHDL 2.9 2.6   No results found for: HGBA1C  Procedures: No results found. ECG OBTAINED AND REVIEWED BY MYSELF: NSR @ 66bpm, LAD, LAE, poor R wave progression. t wave inverted V3-V4. No acute ischemic changes. No change since  07/2013  Assessment/Plan   ICD-10-CM   1. Well adult exam Z00.00   2. Paresthesia of tongue R44.8 gabapentin (NEURONTIN) 100 MG capsule  3. Gastroesophageal reflux disease, esophagitis presence not specified K21.9   4. Hypothyroidism due to acquired atrophy of thyroid E03.4 TSH  5. Essential hypertension, benign I10 ALT    BMP with eGFR  6. Mixed hyperlipidemia E78.2 Lipid Panel    ALT  7. Screening for colon cancer Z12.11 Fecal Globin By Immunochemistry  8. Memory loss R41.3    mild cognitive impariment per MMSE 22/30  9. Colon cancer screening Z12.11   10. Estrogen deficiency E28.39 DG Bone Density  11. Age-related osteoporosis without current pathological fracture M81.0 DG Bone Density    Recommend you switch protonix to evening time - 30 minutes prior to last meal  Recommend you try dipping your tongue inTabasco sauce + 8 oz water 3 times daily x 7 days as needed for tingling tongue  START GABAPENTIN 100MG AT BEDTIME FOR TONGUE TINGLING (NERVE DAMAGE)  Continue other medications as ordered  Will call with lab results  [place]  Follow up in 3 mos for tongue paresthesias, thyroid, GERD, HTN  Keeping you healthy handout given  Braxon Suder S. Perlie Gold  [facility] [address] [place], [place] [address] [phone] Cell (Monday-Friday 8 AM - 5 PM) [phone] After 5 PM and follow prompts

## 2017-04-05 NOTE — Patient Instructions (Addendum)
Recommend you switch protonix to evening time - 30 minutes prior to last meal  Recommend you try dipping your tongue inTabasco sauce + 8 oz water 3 times daily x 7 days as needed for tingling tongue  START GABAPENTIN 100MG  AT BEDTIME FOR TONGUE TINGLING (NERVE DAMAGE)  Continue other medications as ordered  Will call with lab results  PLEASE RETURN STOOL CARD  Follow up in 3 mos for tongue paresthesias, thyroid, GERD, HTN  Keeping You Healthy  Get These Tests  Blood Pressure- Have your blood pressure checked by your healthcare provider at least once a year.  Normal blood pressure is 120/80.  Weight- Have your body mass index (BMI) calculated to screen for obesity.  BMI is a measure of body fat based on height and weight.  You can calculate your own BMI at [employer].it  Cholesterol- Have your cholesterol checked every year.  Diabetes- Have your blood sugar checked every year if you have high blood pressure, high cholesterol, a family history of diabetes or if you are overweight.  Pap Test - Have a pap test every 1 to 5 years if you have been sexually active.  If you are older than 65 and recent pap tests have been normal you may not need additional pap tests.  In addition, if you have had a hysterectomy  for benign disease additional pap tests are not necessary.  Mammogram-Yearly mammograms are essential for early detection of breast cancer  Screening for Colon Cancer- Colonoscopy starting at age 40. Screening may begin sooner depending on your family history and other health conditions.  Follow up colonoscopy as directed by your Gastroenterologist.  Screening for Osteoporosis- Screening begins at age 78 with bone density scanning, sooner if you are at higher risk for developing Osteoporosis.  Get these medicines  Calcium with Vitamin D- Your body requires 1200-1500 mg of Calcium a day and [phone] IU of Vitamin D a day.  You can only absorb 500 mg of Calcium at a time  therefore Calcium must be taken in 2 or 3 separate doses throughout the day.  Hormones- Hormone therapy has been associated with increased risk for certain cancers and heart disease.  Talk to your healthcare provider about if you need relief from menopausal symptoms.  Aspirin- Ask your healthcare provider about taking Aspirin to prevent Heart Disease and Stroke.  Get these Immuniztions  Flu shot- Every fall  Pneumonia shot- Once after the age of 13; if you are younger ask your healthcare provider if you need a pneumonia shot.  Tetanus- Every ten years.  Zostavax- Once after the age of 81 to prevent shingles.  Take these steps  Don't smoke- Your healthcare provider can help you quit. For tips on how to quit, ask your healthcare provider or go to www.smokefree.gov or call 1-800 QUIT-NOW.  Be physically active- Exercise 5 days a week for a minimum of 30 minutes.  If you are not already physically active, start slow and gradually work up to 30 minutes of moderate physical activity.  Try walking, dancing, bike riding, swimming, etc.  Eat a healthy diet- Eat a variety of healthy foods such as fruits, vegetables, whole grains, low fat milk, low fat cheeses, yogurt, lean meats, chicken, fish, eggs, dried beans, tofu, etc.  For more information go to www.thenutritionsource.org  Dental visit- Brush and floss teeth twice daily; visit your dentist twice a year.  Eye exam- Visit your Optometrist or Ophthalmologist yearly.  Drink alcohol in moderation- Limit alcohol intake to one  drink or less a day.  Never drink and drive.  Depression- Your emotional health is as important as your physical health.  If you're feeling down or losing interest in things you normally enjoy, please talk to your healthcare provider.  Seat Belts- can save your life; always wear one  Smoke/Carbon Monoxide detectors- These detectors need to be installed on the appropriate level of your home.  Replace batteries at least  once a year.  Violence- If anyone is threatening or hurting you, please tell your healthcare provider.  Living Will/ Health care power of attorney- Discuss with your healthcare provider and family.

## 2017-04-11 ENCOUNTER — Other Ambulatory Visit: Payer: Medicare Other

## 2017-04-11 DIAGNOSIS — Z1211 Encounter for screening for malignant neoplasm of colon: Secondary | ICD-10-CM

## 2017-04-12 LAB — FECAL GLOBIN BY IMMUNOCHEMISTRY

## 2017-04-13 ENCOUNTER — Telehealth: Payer: Self-pay

## 2017-04-16 ENCOUNTER — Other Ambulatory Visit: Payer: Self-pay | Admitting: Internal Medicine

## 2017-04-20 NOTE — Addendum Note (Signed)
Addended by: Logan Bores on: 04/20/2017 03:43 PM   Modules accepted: Orders

## 2017-04-25 NOTE — Telephone Encounter (Signed)
Opened in error

## 2017-04-28 ENCOUNTER — Other Ambulatory Visit: Payer: Medicare Other

## 2017-04-28 ENCOUNTER — Other Ambulatory Visit: Payer: Self-pay | Admitting: Internal Medicine

## 2017-04-28 DIAGNOSIS — Z1211 Encounter for screening for malignant neoplasm of colon: Secondary | ICD-10-CM | POA: Diagnosis not present

## 2017-04-29 LAB — FECAL GLOBIN BY IMMUNOCHEMISTRY
FECAL GLOBIN RESULT:: NOT DETECTED
MICRO NUMBER:: 90110265
SPECIMEN QUALITY:: ADEQUATE

## 2017-06-02 ENCOUNTER — Other Ambulatory Visit: Payer: Self-pay | Admitting: *Deleted

## 2017-06-02 MED ORDER — PANTOPRAZOLE SODIUM 40 MG PO TBEC: 40.0000 mg | DELAYED_RELEASE_TABLET | Freq: Every day | ORAL | 1 refills | Status: DC

## 2017-06-02 MED ORDER — AMLODIPINE BESYLATE 5 MG PO TABS: 5.0000 mg | ORAL_TABLET | Freq: Every day | ORAL | 1 refills | Status: DC

## 2017-06-02 MED ORDER — LEVOTHYROXINE SODIUM 75 MCG PO TABS: ORAL_TABLET | ORAL | 1 refills | Status: DC

## 2017-06-02 NOTE — Telephone Encounter (Signed)
[facility] 

## 2017-06-28 ENCOUNTER — Other Ambulatory Visit: Payer: Self-pay | Admitting: Internal Medicine

## 2017-06-30 ENCOUNTER — Other Ambulatory Visit: Payer: Self-pay | Admitting: Internal Medicine

## 2017-07-04 ENCOUNTER — Ambulatory Visit (INDEPENDENT_AMBULATORY_CARE_PROVIDER_SITE_OTHER): Payer: Medicare Other | Admitting: Internal Medicine

## 2017-07-04 ENCOUNTER — Encounter: Payer: Self-pay | Admitting: Internal Medicine

## 2017-07-04 VITALS — BP 122/80 | HR 65 | Temp 98.0°F | Resp 10 | Ht 62.0 in | Wt 118.0 lb

## 2017-07-04 DIAGNOSIS — I10 Essential (primary) hypertension: Secondary | ICD-10-CM

## 2017-07-04 DIAGNOSIS — Z79899 Other long term (current) drug therapy: Secondary | ICD-10-CM | POA: Diagnosis not present

## 2017-07-04 DIAGNOSIS — E034 Atrophy of thyroid (acquired): Secondary | ICD-10-CM

## 2017-07-04 DIAGNOSIS — R0989 Other specified symptoms and signs involving the circulatory and respiratory systems: Secondary | ICD-10-CM

## 2017-07-04 DIAGNOSIS — E782 Mixed hyperlipidemia: Secondary | ICD-10-CM | POA: Diagnosis not present

## 2017-07-04 DIAGNOSIS — L249 Irritant contact dermatitis, unspecified cause: Secondary | ICD-10-CM

## 2017-07-04 LAB — BASIC METABOLIC PANEL WITH GFR
BUN: 20 mg/dL (ref 7–25)
CO2: 27 mmol/L (ref 20–32)
Calcium: 10.2 mg/dL (ref 8.6–10.4)
Chloride: 100 mmol/L (ref 98–110)
Creat: 0.88 mg/dL (ref 0.60–0.93)
GFR, Est African American: 74 mL/min/{1.73_m2} (ref >=60)
GFR, Est Non African American: 64 mL/min/{1.73_m2} (ref >=60)
Glucose, Bld: 86 mg/dL (ref 65–99)
Potassium: 4.4 mmol/L (ref 3.5–5.3)
Sodium: 142 mmol/L (ref 135–146)

## 2017-07-04 LAB — LIPID PANEL
Cholesterol: 215 mg/dL — ABNORMAL HIGH (ref <200)
HDL: 61 mg/dL (ref >50)
LDL Cholesterol (Calc): 134 mg/dL (calc) — ABNORMAL HIGH
Non-HDL Cholesterol (Calc): 154 mg/dL (calc) — ABNORMAL HIGH (ref <130)
Total CHOL/HDL Ratio: 3.5 (calc) (ref <5.0)
Triglycerides: 92 mg/dL (ref <150)

## 2017-07-04 LAB — TSH: TSH: 1.91 mIU/L (ref 0.40–4.50)

## 2017-07-04 LAB — ALT: ALT: 15 U/L (ref 6–29)

## 2017-07-04 NOTE — Patient Instructions (Addendum)
Will call with dermatology referral - use cream on nipple area until you are seen by dermatology  Will call with vascular study appt  Continue current medications as ordered  Will call with lab results  Follow up in 3 mos for HTN, thyroid, hyperlipidemia

## 2017-07-04 NOTE — Progress Notes (Signed)
Patient ID: Tina Roberts, female   DOB: Feb 07, 1941, 77 y.o.   MRN: [id number]   Location:  [facility] OFFICE  Provider: DR Arletha Grippe  Goals of Care:  Advanced Directives 04/05/2017  Does Patient Have a Medical Advance Directive? Yes  Type of Paramedic of [place];Living will  Does patient want to make changes to medical advance directive? No - Patient declined  Copy of [place] in Chart? Yes  Would patient like information on creating a medical advance directive? -     Chief Complaint  Patient presents with  . Medical Management of Chronic Issues    3 month follow-up on tounge, paresthesias, thyroid, GERD, and HTN. Review Home Visit from insurance company   . Medication Refill    No refills needed   . Immunizations    Refused TDaP and Shingrix- financal reasons     HPI: Patient is a 76 y.o. female seen today for medical management of chronic diseases.  She had visit from [facility] provider and screen revealed decreased circulation in right leg (0.70).   Right breast irritant contact dermatitis - she noticed it returning. She ran out of triamcinolone cream  Hyperlipidemia - diet controlled. LDL 112; Tchol 218 ; HDL 85  Osteoporosis - takes calcium w D. Fosamax stopped several mos ago due to ADRs. Never tried prolia injections. Last DXA in 2016. She has not scheduled repeat DXA in 2019 (order placed in Jan 2019)  HTN - BP stable on amlodipine  GERD/HH/LPR - stable on digestive enzymes qAC and daily protonix. Last EGD in 2015 showed small hiatal hernia but no other abnormality   Hypothyroidism - stable on levothyroxine. TSH 1.07; T4 free 1.3  Tongue paresthesias - overall stable. affects tip of tongue. Uses OTC mouth wash with spicy taste which does not help as much. She has seen ENT Dr Sallee Provencal in the past. She was told she has burning tongue syndrome and to dip tongue into 1 gtt Tabasco sauce + 8 oz water TID and increase gtts  to max 3 gtts TID. She has not tried that in a while.   Joint pain - controlled with voltaren gel   Past Medical History:  Diagnosis Date  . Conjunctivitis unspecified   . Disorder of bone and cartilage, unspecified   . GERD (gastroesophageal reflux disease)   . Hepatic lesion   . History of gallstones   . Hyperlipidemia   . Hypertension   . Hypothyroidism   . Kidney stone   . Lacrimal disorder   . Myalgia and myositis, unspecified   . Osteoarthrosis, unspecified whether generalized or localized, unspecified site   . Osteopenia   . Osteoporosis   . Osteoporosis, unspecified   . Other and unspecified hyperlipidemia   . Other dysphagia   . Pain in joint, forearm   . Reflux esophagitis   . Unspecified disorder of kidney and ureter   . Unspecified essential hypertension   . Unspecified hypothyroidism   . Unspecified vitamin D deficiency   . Vitamin D deficiency   . Vitiligo   . Vitiligo     Past Surgical History:  Procedure Laterality Date  . LEG SURGERY     right  . TUBAL LIGATION  1973     reports that she has never smoked. She has never used smokeless tobacco. She reports that she drinks alcohol. She reports that she does not use drugs. Social History   Socioeconomic History  . Marital status: Married  Spouse name: Not on file  . Number of children: 4  . Years of education: Not on file  . Highest education level: Not on file  Occupational History  . Not on file  Social Needs  . Financial resource strain: Not on file  . Food insecurity:    Worry: Not on file    Inability: Not on file  . Transportation needs:    Medical: Not on file    Non-medical: Not on file  Tobacco Use  . Smoking status: Never Smoker  . Smokeless tobacco: Never Used  Substance and Sexual Activity  . Alcohol use: Yes    Alcohol/week: 0.0 oz    Comment: occasional drink  . Drug use: No  . Sexual activity: Never  Lifestyle  . Physical activity:    Days per week: Not on file     Minutes per session: Not on file  . Stress: Not on file  Relationships  . Social connections:    Talks on phone: Not on file    Gets together: Not on file    Attends religious service: Not on file    Active member of club or organization: Not on file    Attends meetings of clubs or organizations: Not on file    Relationship status: Not on file  . Intimate partner violence:    Fear of current or ex partner: Not on file    Emotionally abused: Not on file    Physically abused: Not on file    Forced sexual activity: Not on file  Other Topics Concern  . Not on file  Social History Narrative  . Not on file    Family History  Problem Relation Age of Onset  . Colon cancer Mother 32  . Diabetes Mother   . Diabetes Sister     No Known Allergies  Outpatient Encounter Medications as of 07/04/2017  Medication Sig  . amLODipine (NORVASC) 5 MG tablet TAKE 1 TABLET BY MOUTH EVERY DAY  . calcium-vitamin D (OSCAL WITH D) 500-200 MG-UNIT tablet Take 1 tablet by mouth daily with breakfast.  . diclofenac sodium (VOLTAREN) 1 % GEL Apply 2 g topically 4 (four) times daily.  . Digestive Enzymes (ENZYME DIGEST PO) Take 1 tablet ( 500 mg ) by mouth at meal time  For acid reflux  . levothyroxine (SYNTHROID, LEVOTHROID) 75 MCG tablet TAKE ONE TABLET BY MOUTH ONCE DAILY FOR THYROID  . pantoprazole (PROTONIX) 40 MG tablet Take 1 tablet (40 mg total) by mouth daily.  Marland Kitchen triamcinolone cream (KENALOG) 0.1 % Apply 1 application topically 2 (two) times daily. Apply to right nipple until rash is gone  . [DISCONTINUED] gabapentin (NEURONTIN) 100 MG capsule Take 1 capsule (100 mg total) by mouth at bedtime. For tongue nerve damage  . [DISCONTINUED] OS-CAL CALCIUM + D3 500-200 MG-UNIT TABS TAKE 1 TABLET BY MOUTH TWICE A DAY   No facility-administered encounter medications on file as of 07/04/2017.     Review of Systems:  Review of Systems  Unable to perform ROS: Other (memory loss)    Health Maintenance    Topic Date Due  . TETANUS/TDAP  09/14/2017 (Originally 11/21/1960)  . INFLUENZA VACCINE  11/02/2017  . COLONOSCOPY  01/21/2020  . DEXA SCAN  Completed  . PNA vac Low Risk Adult  Completed    Physical Exam: Vitals:   07/04/17 0904  BP: 122/80  Pulse: 65  Resp: 10  Temp: 98 F (36.7 C)  TempSrc: Oral  SpO2: 96%  Weight: 118 lb (53.5 kg)  Height: '5\' 2"'$  (1.575 m)   Body mass index is 21.58 kg/m. Physical Exam  Constitutional: She appears well-developed and well-nourished.  HENT:  Mouth/Throat: Oropharynx is clear and moist. No oropharyngeal exudate.  MMM; no oral thrush; no tongue lesions  Eyes: Pupils are equal, round, and reactive to light. No scleral icterus.  Neck: Neck supple. Carotid bruit is not present. No tracheal deviation present. No thyromegaly present.  Cardiovascular: Normal rate, regular rhythm, normal heart sounds and intact distal pulses. Exam reveals no gallop and no friction rub.  No murmur heard. No LE edema b/l. no calf TTP.   Pulmonary/Chest: Effort normal and breath sounds normal. No stridor. No respiratory distress. She has no wheezes. She has no rales.  Abdominal: Soft. Normal appearance and bowel sounds are normal. She exhibits no distension and no mass. There is no hepatomegaly. There is no tenderness. There is no rigidity, no rebound and no guarding. No hernia.  Musculoskeletal: She exhibits edema.  Lymphadenopathy:    She has no cervical adenopathy.  Neurological: She is alert.  Skin: Skin is warm and dry. Rash (right nipple dry plaque with min redness) noted.  Psychiatric: She has a normal mood and affect. Her behavior is normal. Thought content normal.    Labs reviewed: Basic Metabolic Panel: Recent Labs    09/19/16 0836 11/22/16 0959 04/05/17 1100  NA 140 140 139  K 3.8 3.6 3.8  CL 103 104 101  CO2 '23 21 28  '$ GLUCOSE 82 101* 86  BUN '17 15 15  '$ CREATININE 1.09* 0.90 0.88  CALCIUM 9.5 9.5 10.2  TSH 3.12 1.18 1.07   Liver Function  Tests: Recent Labs    09/19/16 0836 11/22/16 0959 04/05/17 1100  AST  --  23  --   ALT '17 15 18  '$ ALKPHOS  --  48  --   BILITOT  --  0.5  --   PROT  --  7.1  --   ALBUMIN  --  4.3  --    No results for input(s): LIPASE, AMYLASE in the last 8760 hours. No results for input(s): AMMONIA in the last 8760 hours. CBC: Recent Labs    11/22/16 0959  WBC 4.6  NEUTROABS 2,300  HGB 13.2  HCT 40.9  MCV 86.5  PLT 190   Lipid Panel: Recent Labs    09/19/16 0836 11/22/16 0959 04/05/17 1100  CHOL 198 200* 218*  HDL 69 76 85  LDLCALC 107* 106* 112*  TRIG 110 90 107  CHOLHDL 2.9 2.6 2.6   No results found for: HGBA1C  Procedures since last visit: No results found.  Assessment/Plan   ICD-10-CM   1. Irritant contact dermatitis, unspecified trigger L24.9 Ambulatory referral to Dermatology   recurrent  2. Decreased circulation in fingers or toes R09.89 VAS [place] ABI WITH/WO TBI  3. Mixed hyperlipidemia E78.2 Lipid Panel  4. Hypothyroidism due to acquired atrophy of thyroid E03.4 TSH  5. Essential hypertension, benign I10 BMP with eGFR([facility])  6. High risk medication use Z79.899 ALT    Will call with dermatology referral - use cream on nipple area until you are seen by dermatology  Will call with vascular study appt  Continue current medications as ordered  Will call with lab results  Follow up in 3 mos for HTN, thyroid, hyperlipidemia   Daney Moor S. Perlie Gold  [facility] and [facility] [address] [place], [place] [address] [phone]  Cell (Monday-Friday 8 AM - 5 PM) [phone] After 5 PM and follow prompts

## 2017-07-06 ENCOUNTER — Ambulatory Visit (HOSPITAL_COMMUNITY)
Admission: RE | Admit: 2017-07-06 | Discharge: 2017-07-06 | Disposition: A | Discharge status: Home / Self Care | Payer: Medicare Other | Source: Ambulatory Visit | Attending: Vascular Surgery | Admitting: Vascular Surgery

## 2017-07-06 DIAGNOSIS — R0989 Other specified symptoms and signs involving the circulatory and respiratory systems: Secondary | ICD-10-CM | POA: Insufficient documentation

## 2017-07-19 DIAGNOSIS — L249 Irritant contact dermatitis, unspecified cause: Secondary | ICD-10-CM | POA: Diagnosis not present

## 2017-07-31 ENCOUNTER — Ambulatory Visit
Admission: RE | Admit: 2017-07-31 | Discharge: 2017-07-31 | Disposition: A | Discharge status: Home / Self Care | Payer: Medicare Other | Source: Ambulatory Visit | Attending: Internal Medicine | Admitting: Internal Medicine

## 2017-07-31 DIAGNOSIS — E2839 Other primary ovarian failure: Secondary | ICD-10-CM

## 2017-07-31 DIAGNOSIS — M810 Age-related osteoporosis without current pathological fracture: Secondary | ICD-10-CM

## 2017-08-02 ENCOUNTER — Inpatient Hospital Stay: Admission: RE | Admit: 2017-08-02 | Payer: Medicare Other | Source: Ambulatory Visit

## 2017-09-01 ENCOUNTER — Ambulatory Visit
Admission: RE | Admit: 2017-09-01 | Discharge: 2017-09-01 | Disposition: A | Discharge status: Home / Self Care | Payer: Medicare Other | Source: Ambulatory Visit | Attending: Internal Medicine | Admitting: Internal Medicine

## 2017-09-01 DIAGNOSIS — M85852 Other specified disorders of bone density and structure, left thigh: Secondary | ICD-10-CM | POA: Diagnosis not present

## 2017-09-01 DIAGNOSIS — Z780 Asymptomatic menopausal state: Secondary | ICD-10-CM | POA: Diagnosis not present

## 2017-09-20 ENCOUNTER — Other Ambulatory Visit: Payer: Self-pay | Admitting: Internal Medicine

## 2017-09-20 DIAGNOSIS — Z1231 Encounter for screening mammogram for malignant neoplasm of breast: Secondary | ICD-10-CM

## 2017-10-03 ENCOUNTER — Ambulatory Visit (INDEPENDENT_AMBULATORY_CARE_PROVIDER_SITE_OTHER): Payer: Medicare Other | Admitting: Internal Medicine

## 2017-10-03 ENCOUNTER — Encounter: Payer: Self-pay | Admitting: Internal Medicine

## 2017-10-03 VITALS — BP 128/76 | HR 66 | Temp 98.3°F | Ht 62.0 in | Wt 118.0 lb

## 2017-10-03 DIAGNOSIS — J302 Other seasonal allergic rhinitis: Secondary | ICD-10-CM | POA: Diagnosis not present

## 2017-10-03 DIAGNOSIS — Z124 Encounter for screening for malignant neoplasm of cervix: Secondary | ICD-10-CM | POA: Diagnosis not present

## 2017-10-03 DIAGNOSIS — N760 Acute vaginitis: Secondary | ICD-10-CM

## 2017-10-03 DIAGNOSIS — R87618 Other abnormal cytological findings on specimens from cervix uteri: Secondary | ICD-10-CM | POA: Diagnosis not present

## 2017-10-03 DIAGNOSIS — I10 Essential (primary) hypertension: Secondary | ICD-10-CM | POA: Diagnosis not present

## 2017-10-03 DIAGNOSIS — N939 Abnormal uterine and vaginal bleeding, unspecified: Secondary | ICD-10-CM

## 2017-10-03 DIAGNOSIS — E782 Mixed hyperlipidemia: Secondary | ICD-10-CM | POA: Diagnosis not present

## 2017-10-03 DIAGNOSIS — Z79899 Other long term (current) drug therapy: Secondary | ICD-10-CM

## 2017-10-03 DIAGNOSIS — E034 Atrophy of thyroid (acquired): Secondary | ICD-10-CM

## 2017-10-03 LAB — LIPID PANEL
Cholesterol: 194 mg/dL (ref <200)
HDL: 66 mg/dL (ref >50)
LDL Cholesterol (Calc): 109 mg/dL (calc) — ABNORMAL HIGH
Non-HDL Cholesterol (Calc): 128 mg/dL (calc) (ref <130)
Total CHOL/HDL Ratio: 2.9 (calc) (ref <5.0)
Triglycerides: 94 mg/dL (ref <150)

## 2017-10-03 LAB — BASIC METABOLIC PANEL WITH GFR
BUN: 12 mg/dL (ref 7–25)
CO2: 27 mmol/L (ref 20–32)
Calcium: 9.7 mg/dL (ref 8.6–10.4)
Chloride: 106 mmol/L (ref 98–110)
Creat: 0.88 mg/dL (ref 0.60–0.93)
GFR, Est African American: 74 mL/min/{1.73_m2} (ref >=60)
GFR, Est Non African American: 64 mL/min/{1.73_m2} (ref >=60)
Glucose, Bld: 91 mg/dL (ref 65–99)
Potassium: 3.9 mmol/L (ref 3.5–5.3)
Sodium: 142 mmol/L (ref 135–146)

## 2017-10-03 LAB — ALT: ALT: 14 U/L (ref 6–29)

## 2017-10-03 LAB — TSH: TSH: 2.06 mIU/L (ref 0.40–4.50)

## 2017-10-03 MED ORDER — METRONIDAZOLE 0.75 % VA GEL: 1.0000 | Freq: Two times a day (BID) | VAGINAL | 0 refills | Status: AC

## 2017-10-03 NOTE — Progress Notes (Signed)
Patient ID: Tina Roberts, female   DOB: [birth year], 77 y.o.   MRN: [id number]   Location:  [facility] OFFICE  Provider: DR Arletha Grippe  Code Status: FULL CODE Goals of Care:  Advanced Directives 10/03/2017  Does Patient Have a Medical Advance Directive? Yes  Type of Advance Directive [place]  Does patient want to make changes to medical advance directive? No - Patient declined  Copy of [place] in Chart? Yes  Would patient like information on creating a medical advance directive? -     Chief Complaint  Patient presents with  . Medical Management of Chronic Issues    3 month follow-up on HTN, Thyroid, and Heyperlipidemia  . Vaginal Discharge    Off/on patient with yellow/green discharge from vaginal area, patient denies being sexually active     HPI: Patient is a 77 y.o. female seen today for medical management of chronic diseases. She is c/a intermittent vaginal d/c x at least 2 mos. Denies sexual activity. D/c is green, no odor and no pruritus. She has increased thick post nasal drip.  Right breast irritant contact dermatitis - unchanged. She uses triamcinolone cream daily. Next mammogram appt is 10/25/17  Hyperlipidemia - diet controlled. LDL 134; Tchol 215; HDL 61  Osteoporosis - takes calcium w D. Fosamax stopped several mos ago due to ADRs. Never tried prolia injections. Last DXA in 2016. She has not scheduled repeat DXA in 2019 (order placed in Jan 2019)  HTN - BP stable on amlodipine  GERD/HH/LPR - stable on digestive enzymes qAC and daily protonix. Last EGD in 2015 showed small hiatal hernia but no other abnormality   Hypothyroidism - stable on levothyroxine. TSH 1.91; T4 free 1.3  Tongue paresthesias - overall stable. affects tip of tongue. Uses OTC mouth wash with spicy taste which does not help as much. She has seen ENT Dr Sallee Provencal in the past. She was told she has burning tongue syndrome and to dip tongue into 1 gtt Tabasco sauce  + 8 oz water TID and increase gtts to max 3 gtts TID. She has not tried that in a while.   Joint pain - controlled with voltaren gel   Past Medical History:  Diagnosis Date  . Conjunctivitis unspecified   . Disorder of bone and cartilage, unspecified   . GERD (gastroesophageal reflux disease)   . Hepatic lesion   . History of gallstones   . Hyperlipidemia   . Hypertension   . Hypothyroidism   . Kidney stone   . Lacrimal disorder   . Myalgia and myositis, unspecified   . Osteoarthrosis, unspecified whether generalized or localized, unspecified site   . Osteopenia   . Osteoporosis   . Osteoporosis, unspecified   . Other and unspecified hyperlipidemia   . Other dysphagia   . Pain in joint, forearm   . Reflux esophagitis   . Unspecified disorder of kidney and ureter   . Unspecified essential hypertension   . Unspecified hypothyroidism   . Unspecified vitamin D deficiency   . Vitamin D deficiency   . Vitiligo   . Vitiligo     Past Surgical History:  Procedure Laterality Date  . LEG SURGERY     right  . TUBAL LIGATION  1973     reports that she has never smoked. She has never used smokeless tobacco. She reports that she drinks alcohol. She reports that she does not use drugs. Social History   Socioeconomic History  . Marital status:  Married    Spouse name: Not on file  . Number of children: 4  . Years of education: Not on file  . Highest education level: Not on file  Occupational History  . Not on file  Social Needs  . Financial resource strain: Not on file  . Food insecurity:    Worry: Not on file    Inability: Not on file  . Transportation needs:    Medical: Not on file    Non-medical: Not on file  Tobacco Use  . Smoking status: Never Smoker  . Smokeless tobacco: Never Used  Substance and Sexual Activity  . Alcohol use: Yes    Alcohol/week: 0.0 oz    Comment: occasional drink  . Drug use: No  . Sexual activity: Never  Lifestyle  . Physical activity:      Days per week: Not on file    Minutes per session: Not on file  . Stress: Not on file  Relationships  . Social connections:    Talks on phone: Not on file    Gets together: Not on file    Attends religious service: Not on file    Active member of club or organization: Not on file    Attends meetings of clubs or organizations: Not on file    Relationship status: Not on file  . Intimate partner violence:    Fear of current or ex partner: Not on file    Emotionally abused: Not on file    Physically abused: Not on file    Forced sexual activity: Not on file  Other Topics Concern  . Not on file  Social History Narrative  . Not on file    Family History  Problem Relation Age of Onset  . Colon cancer Mother 90  . Diabetes Mother   . Diabetes Sister     No Known Allergies  Outpatient Encounter Medications as of 10/03/2017  Medication Sig  . amLODipine (NORVASC) 5 MG tablet TAKE 1 TABLET BY MOUTH EVERY DAY  . calcium-vitamin D (OSCAL WITH D) 500-200 MG-UNIT tablet Take 1 tablet by mouth daily with breakfast.  . diclofenac sodium (VOLTAREN) 1 % GEL Apply 2 g topically 4 (four) times daily.  . Digestive Enzymes (ENZYME DIGEST PO) Take 1 tablet ( 500 mg ) by mouth at meal time  For acid reflux  . levothyroxine (SYNTHROID, LEVOTHROID) 75 MCG tablet TAKE ONE TABLET BY MOUTH ONCE DAILY FOR THYROID  . pantoprazole (PROTONIX) 40 MG tablet Take 1 tablet (40 mg total) by mouth daily.  Marland Kitchen triamcinolone cream (KENALOG) 0.1 % Apply 1 application topically 2 (two) times daily. Apply to right nipple until rash is gone   No facility-administered encounter medications on file as of 10/03/2017.     Review of Systems:  Review of Systems  HENT: Positive for hearing loss.   Genitourinary: Positive for vaginal discharge.  All other systems reviewed and are negative.   Health Maintenance  Topic Date Due  . TETANUS/TDAP  10/04/2023 (Originally 11/21/1960)  . INFLUENZA VACCINE  11/02/2017  .  COLONOSCOPY  01/21/2020  . DEXA SCAN  Completed  . PNA vac Low Risk Adult  Completed    Physical Exam: Vitals:   10/03/17 0920  BP: 128/76  Pulse: 66  Temp: 98.3 F (36.8 C)  TempSrc: Oral  SpO2: 97%  Weight: 118 lb (53.5 kg)  Height: '5\' 2"'  (1.575 m)   Body mass index is 21.58 kg/m. Physical Exam  Constitutional: She is oriented to  person, place, and time. She appears well-developed and well-nourished.  HOH  HENT:  Mouth/Throat: Oropharynx is clear and moist. No oropharyngeal exudate.  MMM; no oral thrush  Eyes: Pupils are equal, round, and reactive to light. No scleral icterus.  Neck: Neck supple. Carotid bruit is not present. No tracheal deviation present. No thyromegaly present.  Cardiovascular: Normal rate, regular rhythm and intact distal pulses. Exam reveals no gallop and no friction rub.  Murmur (1/6 SEM) heard. No LE edema b/l. no calf TTP.   Pulmonary/Chest: Effort normal and breath sounds normal. No stridor. No respiratory distress. She has no wheezes. She has no rales.  Abdominal: Soft. Normal appearance and bowel sounds are normal. She exhibits no distension and no mass. There is no hepatomegaly. There is no tenderness. There is no rigidity, no rebound and no guarding. No hernia.  Genitourinary: Uterus normal.    Pelvic exam was performed with patient supine. No labial fusion. There is tenderness on the right labia. There is no lesion or injury on the right labia. There is no tenderness, lesion or injury on the left labia. Cervix exhibits discharge and friability. Cervix exhibits no motion tenderness. Right adnexum displays no mass and no tenderness. Left adnexum displays no mass and no tenderness. There is erythema in the vagina. No signs of injury around the vagina. Vaginal discharge found.  Musculoskeletal: She exhibits edema.  Lymphadenopathy:    She has no cervical adenopathy.  Neurological: She is alert and oriented to person, place, and time. She has normal  reflexes.  Skin: Skin is warm and dry. No rash noted.  Psychiatric: She has a normal mood and affect. Her behavior is normal. Judgment and thought content normal.    Labs reviewed: Basic Metabolic Panel: Recent Labs    11/22/16 0959 04/05/17 1100 07/04/17 1032  NA 140 139 142  K 3.6 3.8 4.4  CL 104 101 100  CO2 '21 28 27  ' GLUCOSE 101* 86 86  BUN '15 15 20  ' CREATININE 0.90 0.88 0.88  CALCIUM 9.5 10.2 10.2  TSH 1.18 1.07 1.91   Liver Function Tests: Recent Labs    11/22/16 0959 04/05/17 1100 07/04/17 1032  AST 23  --   --   ALT '15 18 15  ' ALKPHOS 48  --   --   BILITOT 0.5  --   --   PROT 7.1  --   --   ALBUMIN 4.3  --   --    No results for input(s): LIPASE, AMYLASE in the last 8760 hours. No results for input(s): AMMONIA in the last 8760 hours. CBC: Recent Labs    11/22/16 0959  WBC 4.6  NEUTROABS 2,300  HGB 13.2  HCT 40.9  MCV 86.5  PLT 190   Lipid Panel: Recent Labs    11/22/16 0959 04/05/17 1100 07/04/17 1032  CHOL 200* 218* 215*  HDL 76 85 61  LDLCALC 106* 112* 134*  TRIG 90 107 92  CHOLHDL 2.6 2.6 3.5   No results found for: HGBA1C  Procedures since last visit: No results found.  Assessment/Plan   ICD-10-CM   1. Acute vaginitis N76.0 metroNIDAZOLE (METROGEL VAGINAL) 0.75 % vaginal gel    Wet Prep for Trick, Yeast, Clue    C. trachomatis/N. gonorrhoeae RNA  2. Vagina bleeding N93.9 CANCELED: PAP, Image Guided [[facility]]  3. Mixed hyperlipidemia E78.2 Lipid Panel  4. Hypothyroidism due to acquired atrophy of thyroid E03.4 TSH  5. High risk medication use Z79.899 BMP with eGFR([facility])  ALT  6. Cervical cancer screening Z12.4 PAP, Image Guided [[facility]]    CANCELED: PAP, Image Guided [[facility]]  7. Seasonal allergic rhinitis, unspecified trigger J30.2   8. Essential hypertension, benign I10    Continue other medications as ordered  Will call with lab and pap results  START OTC PLAIN CLARITIN, ALLEGRA OR ZYRTEC DAILY FOR  SEASONAL ALLERGY  START METROGEL PER VAGINA 2 TIMES DAILY FOR VAGINAL INFECTION x 5 days  Follow up in 3 mos for HTN, hyperlipidemia, thyroid and GERD   Nas Wafer S. Perlie Gold  [facility] and Adult [facility] [address] [place], [place] [address] [phone] Cell (Monday-Friday 8 AM - 5 PM) [phone] After 5 PM and follow prompts

## 2017-10-03 NOTE — Patient Instructions (Addendum)
Continue other medications as ordered  Will call with lab and pap results  START OTC PLAIN CLARITIN, ALLEGRA OR ZYRTEC DAILY FOR SEASONAL ALLERGY  START METROGEL PER VAGINA 2 TIMES DAILY FOR VAGINAL INFECTION x 5 days  Follow up in 3 mos for HTN, hyperlipidemia, thyroid and GERD

## 2017-10-04 LAB — PAP IG (IMAGE GUIDED)

## 2017-10-04 LAB — C. TRACHOMATIS/N. GONORRHOEAE RNA

## 2017-10-24 ENCOUNTER — Other Ambulatory Visit: Payer: Self-pay | Admitting: Internal Medicine

## 2017-10-25 ENCOUNTER — Ambulatory Visit
Admission: RE | Admit: 2017-10-25 | Discharge: 2017-10-25 | Disposition: A | Discharge status: Home / Self Care | Payer: Medicare Other | Source: Ambulatory Visit | Attending: Internal Medicine | Admitting: Internal Medicine

## 2017-10-25 DIAGNOSIS — Z1231 Encounter for screening mammogram for malignant neoplasm of breast: Secondary | ICD-10-CM | POA: Diagnosis not present

## 2017-11-22 ENCOUNTER — Encounter: Payer: Self-pay | Admitting: Internal Medicine

## 2017-12-01 ENCOUNTER — Telehealth: Payer: Self-pay

## 2017-12-01 ENCOUNTER — Ambulatory Visit (INDEPENDENT_AMBULATORY_CARE_PROVIDER_SITE_OTHER): Payer: Medicare Other

## 2017-12-01 VITALS — BP 128/74 | HR 74 | Temp 98.0°F | Ht 62.0 in | Wt 117.0 lb

## 2017-12-01 DIAGNOSIS — Z9189 Other specified personal risk factors, not elsewhere classified: Secondary | ICD-10-CM | POA: Diagnosis not present

## 2017-12-01 DIAGNOSIS — Z0000 Encounter for general adult medical examination without abnormal findings: Secondary | ICD-10-CM

## 2017-12-01 NOTE — Telephone Encounter (Signed)
error 

## 2017-12-01 NOTE — Progress Notes (Signed)
Subjective:   Tina Roberts is a 77 y.o. female who presents for Medicare Annual (Subsequent) preventive examination.  Last AWV-11/25/2016    Objective:     Vitals: BP 128/74 (BP Location: Left Arm, Patient Position: Sitting)   Pulse 74   Temp 98 F (36.7 C) (Oral)   Ht 5\' 2"  (1.575 m)   Wt 117 lb (53.1 kg)   SpO2 96%   BMI 21.40 kg/m   Body mass index is 21.4 kg/m.  Advanced Directives 12/01/2017 10/03/2017 04/05/2017 12/02/2016 11/25/2016 09/21/2016 03/16/2016  Does Patient Have a Medical Advance Directive? Yes Yes Yes Yes Yes No Yes  Type of Advance Directive Living will;Healthcare Power of [place];Living will [place];Living will Healthcare Power of [place]  Does patient want to make changes to medical advance directive? No - Patient declined No - Patient declined No - Patient declined - No - Patient declined - -  Copy of [place] in Chart? Yes Yes Yes Yes No - copy requested - No - copy requested  Would patient like information on creating a medical advance directive? - - - - - No - Patient declined -    Tobacco Social History   Tobacco Use  Smoking Status Never Smoker  Smokeless Tobacco Never Used     Counseling given: Not Answered   Clinical Intake:  Pre-visit preparation completed: No  Pain : No/denies pain     Nutritional Risks: None Diabetes: No  How often do you need to have someone help you when you read instructions, pamphlets, or other written materials from your doctor or pharmacy?: 1 - Never What is the last grade level you completed in school?: 10th grade  Interpreter Needed?: No  Information entered by :: Tyson Dense, RN  Past Medical History:  Diagnosis Date  . Conjunctivitis unspecified   . Disorder of bone and cartilage, unspecified   . GERD (gastroesophageal reflux disease)   . Hepatic lesion   . History of  gallstones   . Hyperlipidemia   . Hypertension   . Hypothyroidism   . Kidney stone   . Lacrimal disorder   . Myalgia and myositis, unspecified   . Osteoarthrosis, unspecified whether generalized or localized, unspecified site   . Osteopenia   . Osteoporosis   . Osteoporosis, unspecified   . Other and unspecified hyperlipidemia   . Other dysphagia   . Pain in joint, forearm   . Reflux esophagitis   . Unspecified disorder of kidney and ureter   . Unspecified essential hypertension   . Unspecified hypothyroidism   . Unspecified vitamin D deficiency   . Vitamin D deficiency   . Vitiligo   . Vitiligo    Past Surgical History:  Procedure Laterality Date  . LEG SURGERY     right  . TUBAL LIGATION  1973   Family History  Problem Relation Age of Onset  . Colon cancer Mother 73  . Diabetes Mother   . Diabetes Sister    Social History   Socioeconomic History  . Marital status: Married    Spouse name: Not on file  . Number of children: 4  . Years of education: Not on file  . Highest education level: Not on file  Occupational History  . Not on file  Social Needs  . Financial resource strain: Not hard at all  . Food insecurity:    Worry: Never true  Inability: Never true  . Transportation needs:    Medical: No    Non-medical: No  Tobacco Use  . Smoking status: Never Smoker  . Smokeless tobacco: Never Used  Substance and Sexual Activity  . Alcohol use: Yes    Alcohol/week: 0.0 standard drinks    Comment: occasional drink  . Drug use: No  . Sexual activity: Never  Lifestyle  . Physical activity:    Days per week: 3 days    Minutes per session: 90 min  . Stress: Only a little  Relationships  . Social connections:    Talks on phone: More than three times a week    Gets together: More than three times a week    Attends religious service: Never    Active member of club or organization: No    Attends meetings of clubs or organizations: Never    Relationship  status: Married  Other Topics Concern  . Not on file  Social History Narrative  . Not on file    Outpatient Encounter Medications as of 12/01/2017  Medication Sig  . amLODipine (NORVASC) 5 MG tablet TAKE 1 TABLET BY MOUTH  DAILY  . calcium-vitamin D (OSCAL WITH D) 500-200 MG-UNIT tablet Take 1 tablet by mouth daily with breakfast.  . diclofenac sodium (VOLTAREN) 1 % GEL Apply 2 g topically 4 (four) times daily.  Marland Kitchen levothyroxine (SYNTHROID, LEVOTHROID) 75 MCG tablet TAKE 1 TABLET BY MOUTH ONCE DAILY FOR THYROID  . pantoprazole (PROTONIX) 40 MG tablet TAKE 1 TABLET BY MOUTH  DAILY  . triamcinolone cream (KENALOG) 0.1 % Apply 1 application topically 2 (two) times daily. Apply to right nipple until rash is gone  . Digestive Enzymes (ENZYME DIGEST PO) Take 1 tablet ( 500 mg ) by mouth at meal time  For acid reflux   No facility-administered encounter medications on file as of 12/01/2017.     Activities of Daily Living In your present state of health, do you have any difficulty performing the following activities: 12/01/2017  Hearing? N  Vision? N  Difficulty concentrating or making decisions? N  Walking or climbing stairs? N  Dressing or bathing? N  Doing errands, shopping? N  Preparing Food and eating ? N  Using the Toilet? N  In the past six months, have you accidently leaked urine? N  Do you have problems with loss of bowel control? N  Managing your Medications? N  Managing your Finances? N  Housekeeping or managing your Housekeeping? N  Some recent data might be hidden    Patient Care Team: Gildardo Cranker, DO as PCP - General (Internal Medicine)    Assessment:   This is a routine wellness examination for Tina Roberts.  Exercise Activities and Dietary recommendations Current Exercise Habits: Home exercise routine, Type of exercise: walking;treadmill, Time (Minutes): > 60, Frequency (Times/Week): 3, Weekly Exercise (Minutes/Week): 0, Intensity: Mild, Exercise limited by: None  identified  Goals    . Exercise 3x per week (30 min per time)     Starting 03/16/16, I will maintain my current exercise routine of 3 days per week.     . Maintain Lifestyle     Pt will maintain lifestyle.       Fall Risk Fall Risk  12/01/2017 10/03/2017 07/04/2017 04/05/2017 12/02/2016  Falls in the past year? Yes No Yes Yes No  Number falls in past yr: 1 - 1 1 -  Injury with Fall? No - No No -   Is the patient's home free of  loose throw rugs in walkways, pet beds, electrical cords, etc?   yes      Grab bars in the bathroom? yes      Handrails on the stairs?   yes      Adequate lighting?   yes  Timed Get Up and Go performed: 13 seconds  Depression Screen PHQ 2/9 Scores 12/01/2017 04/05/2017 11/25/2016 03/16/2016  PHQ - 2 Score 0 0 0 0     Cognitive Function MMSE - Mini Mental State Exam 12/01/2017 11/25/2016 03/16/2016 01/09/2015  Orientation to time 5 5 5 5   Orientation to Place 5 5 5 5   Registration 3 3 3 3   Attention/ Calculation 0 0 0 0  Recall 3 1 3 3   Language- name 2 objects 2 2 2 2   Language- repeat 1 1 1 1   Language- follow 3 step command 3 3 3 3   Language- read & follow direction 1 1 1 1   Write a sentence 1 1 1 1   Copy design 0 0 0 0  Total score 24 22 24 24         Immunization History  Administered Date(s) Administered  . Influenza Whole 12/03/2012  . Influenza, High Dose Seasonal PF 11/25/2016  . Influenza,inj,Quad PF,6+ Mos 01/29/2014, 01/09/2015  . Influenza-Unspecified 01/20/2016  . Pneumococcal Conjugate-13 01/29/2014  . Pneumococcal Polysaccharide-23 11/20/2012    Qualifies for Shingles Vaccine? Yes, educated and declined  Screening Tests Health Maintenance  Topic Date Due  . INFLUENZA VACCINE  11/02/2017  . TETANUS/TDAP  10/04/2023 (Originally 11/21/1959)  . DEXA SCAN  Completed  . PNA vac Low Risk Adult  Completed    Cancer Screenings: Lung: Low Dose CT Chest recommended if Age 77-80 years, 30 pack-year currently smoking OR have quit w/in  15years. Patient does not qualify. Breast:  Up to date on Mammogram? Yes   Up to date of Bone Density/Dexa? Yes Colorectal: up to date  Additional Screenings:  Hepatitis C Screening: declined Flu vaccine due: patient wants to get it later in the season TDAP due: declined     Plan:  I have personally reviewed and addressed the Medicare Annual Wellness questionnaire and have noted the following in the patient's chart:  A. Medical and social history B. Use of alcohol, tobacco or illicit drugs  C. Current medications and supplements D. Functional ability and status E.  Nutritional status F.  Physical activity G. Advance directives H. List of other physicians I.  Hospitalizations, surgeries, and ER visits in previous 12 months J.  [place] to include hearing, vision, cognitive, depression L. Referrals and appointments - C3 for dentist  In addition, I have reviewed and discussed with patient certain preventive protocols, quality metrics, and best practice recommendations. A written personalized care plan for preventive services as well as general preventive health recommendations were provided to patient.  See attached scanned questionnaire for additional information.   Signed,   Tyson Dense, RN Nurse Health Advisor  Patient Concerns: Vaginal discharge is still green. Metronidazole did not work for her

## 2017-12-01 NOTE — Telephone Encounter (Signed)
Vaginal discharge is still thick and green. Metronidazole did not work for her and she was wondering if there was another medication she could try

## 2017-12-01 NOTE — Patient Instructions (Signed)
Tina Roberts , Thank you for taking time to come for your Medicare Wellness Visit. I appreciate your ongoing commitment to your health goals. Please review the following plan we discussed and let me know if I can assist you in the future.   Screening recommendations/referrals: Colonoscopy excluded, over age 76 Mammogram up to date, due 10/26/2018 Bone Density up to date Recommended yearly ophthalmology/optometry visit for glaucoma screening and checkup Recommended yearly dental visit for hygiene and checkup  Vaccinations: Influenza vaccine due Pneumococcal vaccine up to date, completed Tdap vaccine due, declined Shingles vaccine due, declined    Advanced directives: in chart  Conditions/risks identified: none  Next appointment: Dr. Eulas Post 01/03/2018 @ 2:30pm            Tyson Dense, RN 12/05/2018 @ 10am   Preventive Care 65 Years and Older, Female Preventive care refers to lifestyle choices and visits with your health care provider that can promote health and wellness. What does preventive care include?  A yearly physical exam. This is also called an annual well check.  Dental exams once or twice a year.  Routine eye exams. Ask your health care provider how often you should have your eyes checked.  Personal lifestyle choices, including:  Daily care of your teeth and gums.  Regular physical activity.  Eating a healthy diet.  Avoiding tobacco and drug use.  Limiting alcohol use.  Practicing safe sex.  Taking low-dose aspirin every day.  Taking vitamin and mineral supplements as recommended by your health care provider. What happens during an annual well check? The services and screenings done by your health care provider during your annual well check will depend on your age, overall health, lifestyle risk factors, and family history of disease. Counseling  Your health care provider may ask you questions about your:  Alcohol use.  Tobacco use.  Drug  use.  Emotional well-being.  Home and relationship well-being.  Sexual activity.  Eating habits.  History of falls.  Memory and ability to understand (cognition).  Work and work Statistician.  Reproductive health. Screening  You may have the following tests or measurements:  Height, weight, and BMI.  Blood pressure.  Lipid and cholesterol levels. These may be checked every 5 years, or more frequently if you are over 63 years old.  Skin check.  Lung cancer screening. You may have this screening every year starting at age 50 if you have a 30-pack-year history of smoking and currently smoke or have quit within the past 15 years.  Fecal occult blood test (FOBT) of the stool. You may have this test every year starting at age 8.  Flexible sigmoidoscopy or colonoscopy. You may have a sigmoidoscopy every 5 years or a colonoscopy every 10 years starting at age 10.  Hepatitis C blood test.  Hepatitis B blood test.  Sexually transmitted disease (STD) testing.  Diabetes screening. This is done by checking your blood sugar (glucose) after you have not eaten for a while (fasting). You may have this done every 1-3 years.  Bone density scan. This is done to screen for osteoporosis. You may have this done starting at age 78.  Mammogram. This may be done every 1-2 years. Talk to your health care provider about how often you should have regular mammograms. Talk with your health care provider about your test results, treatment options, and if necessary, the need for more tests. Vaccines  Your health care provider may recommend certain vaccines, such as:  Influenza vaccine. This is recommended every  year.  Tetanus, diphtheria, and acellular pertussis (Tdap, Td) vaccine. You may need a Td booster every 10 years.  Zoster vaccine. You may need this after age 78.  Pneumococcal 13-valent conjugate (PCV13) vaccine. One dose is recommended after age 28.  Pneumococcal polysaccharide  (PPSV23) vaccine. One dose is recommended after age 43. Talk to your health care provider about which screenings and vaccines you need and how often you need them. This information is not intended to replace advice given to you by your health care provider. Make sure you discuss any questions you have with your health care provider. Document Released: 04/17/2015 Document Revised: 12/09/2015 Document Reviewed: 01/20/2015 Elsevier Interactive Patient Education  2017 [employer] Prevention in the Home Falls can cause injuries. They can happen to people of all ages. There are many things you can do to make your home safe and to help prevent falls. What can I do on the outside of my home?  Regularly fix the edges of walkways and driveways and fix any cracks.  Remove anything that might make you trip as you walk through a door, such as a raised step or threshold.  Trim any bushes or trees on the path to your home.  Use bright outdoor lighting.  Clear any walking paths of anything that might make someone trip, such as rocks or tools.  Regularly check to see if handrails are loose or broken. Make sure that both sides of any steps have handrails.  Any raised decks and porches should have guardrails on the edges.  Have any leaves, snow, or ice cleared regularly.  Use sand or salt on walking paths during winter.  Clean up any spills in your garage right away. This includes oil or grease spills. What can I do in the bathroom?  Use night lights.  Install grab bars by the toilet and in the tub and shower. Do not use towel bars as grab bars.  Use non-skid mats or decals in the tub or shower.  If you need to sit down in the shower, use a plastic, non-slip stool.  Keep the floor dry. Clean up any water that spills on the floor as soon as it happens.  Remove soap buildup in the tub or shower regularly.  Attach bath mats securely with double-sided non-slip rug tape.  Do not have  throw rugs and other things on the floor that can make you trip. What can I do in the bedroom?  Use night lights.  Make sure that you have a light by your bed that is easy to reach.  Do not use any sheets or blankets that are too big for your bed. They should not hang down onto the floor.  Have a firm chair that has side arms. You can use this for support while you get dressed.  Do not have throw rugs and other things on the floor that can make you trip. What can I do in the kitchen?  Clean up any spills right away.  Avoid walking on wet floors.  Keep items that you use a lot in easy-to-reach places.  If you need to reach something above you, use a strong step stool that has a grab bar.  Keep electrical cords out of the way.  Do not use floor polish or wax that makes floors slippery. If you must use wax, use non-skid floor wax.  Do not have throw rugs and other things on the floor that can make you trip. What can  I do with my stairs?  Do not leave any items on the stairs.  Make sure that there are handrails on both sides of the stairs and use them. Fix handrails that are broken or loose. Make sure that handrails are as long as the stairways.  Check any carpeting to make sure that it is firmly attached to the stairs. Fix any carpet that is loose or worn.  Avoid having throw rugs at the top or bottom of the stairs. If you do have throw rugs, attach them to the floor with carpet tape.  Make sure that you have a light switch at the top of the stairs and the bottom of the stairs. If you do not have them, ask someone to add them for you. What else can I do to help prevent falls?  Wear shoes that:  Do not have high heels.  Have rubber bottoms.  Are comfortable and fit you well.  Are closed at the toe. Do not wear sandals.  If you use a stepladder:  Make sure that it is fully opened. Do not climb a closed stepladder.  Make sure that both sides of the stepladder are  locked into place.  Ask someone to hold it for you, if possible.  Clearly mark and make sure that you can see:  Any grab bars or handrails.  First and last steps.  Where the edge of each step is.  Use tools that help you move around (mobility aids) if they are needed. These include:  Canes.  Walkers.  Scooters.  Crutches.  Turn on the lights when you go into a dark area. Replace any light bulbs as soon as they burn out.  Set up your furniture so you have a clear path. Avoid moving your furniture around.  If any of your floors are uneven, fix them.  If there are any pets around you, be aware of where they are.  Review your medicines with your doctor. Some medicines can make you feel dizzy. This can increase your chance of falling. Ask your doctor what other things that you can do to help prevent falls. This information is not intended to replace advice given to you by your health care provider. Make sure you discuss any questions you have with your health care provider. Document Released: 01/15/2009 Document Revised: 08/27/2015 Document Reviewed: 04/25/2014 Elsevier Interactive Patient Education  2017 [employer].

## 2017-12-01 NOTE — Telephone Encounter (Signed)
Called to inform patient but she was not home. Patient family member said he would have her call back

## 2017-12-01 NOTE — Telephone Encounter (Signed)
No - recommend you follow up with GYN for further recommendations

## 2017-12-11 ENCOUNTER — Ambulatory Visit: Payer: Self-pay

## 2017-12-19 ENCOUNTER — Telehealth: Payer: Self-pay | Admitting: Internal Medicine

## 2017-12-19 NOTE — Telephone Encounter (Signed)
No answer or voicemail on home number, so I left a message on the mobile number asking the patient to call me at [phone] about community resource referral. VDM (DD)

## 2018-01-03 ENCOUNTER — Ambulatory Visit (INDEPENDENT_AMBULATORY_CARE_PROVIDER_SITE_OTHER): Payer: Medicare Other | Admitting: Internal Medicine

## 2018-01-03 ENCOUNTER — Encounter: Payer: Self-pay | Admitting: Internal Medicine

## 2018-01-03 VITALS — BP 120/72 | HR 66 | Temp 98.5°F | Ht 62.0 in | Wt 118.4 lb

## 2018-01-03 DIAGNOSIS — N760 Acute vaginitis: Secondary | ICD-10-CM | POA: Diagnosis not present

## 2018-01-03 DIAGNOSIS — E782 Mixed hyperlipidemia: Secondary | ICD-10-CM | POA: Diagnosis not present

## 2018-01-03 DIAGNOSIS — Z23 Encounter for immunization: Secondary | ICD-10-CM

## 2018-01-03 DIAGNOSIS — E034 Atrophy of thyroid (acquired): Secondary | ICD-10-CM

## 2018-01-03 DIAGNOSIS — J302 Other seasonal allergic rhinitis: Secondary | ICD-10-CM

## 2018-01-03 DIAGNOSIS — I10 Essential (primary) hypertension: Secondary | ICD-10-CM

## 2018-01-03 DIAGNOSIS — Z79899 Other long term (current) drug therapy: Secondary | ICD-10-CM

## 2018-01-03 MED ORDER — METRONIDAZOLE 0.75 % VA GEL: 1.0000 | Freq: Two times a day (BID) | VAGINAL | 0 refills | Status: AC

## 2018-01-03 NOTE — Patient Instructions (Signed)
FLU SHOT GIVEN TODAY  START METROGEL INSERT 1 APPLICATORFUL INTO VAGINA 2 TIMES DAILY FOR 5 DAYS TO TREAT INFECTION  Continue other medications as ordered  May need GYN referral if discharge does not clear up after treatment with metrogel  Follow up in 3 mos with [name] for routine visit. Fasting labs prior to appt.

## 2018-01-03 NOTE — Progress Notes (Signed)
Patient ID: Tina Roberts, female   DOB: 04/11/1940, 77 y.o.   MRN: [id number]   Location:  [facility] OFFICE  Provider: DR Arletha Grippe  Code Status: FULL CODE Goals of Care:  Advanced Directives 12/01/2017  Does Patient Have a Medical Advance Directive? Yes  Type of Advance Directive Living will;Healthcare Power of Attorney  Does patient want to make changes to medical advance directive? No - Patient declined  Copy of [place] in Chart? Yes  Would patient like information on creating a medical advance directive? -     Chief Complaint  Patient presents with  . Medical Management of Chronic Issues    3 month follow-up   . Immunizations    Flu Vaccine today    HPI: Patient is a 77 y.o. female seen today for medical management of chronic diseases.  She reports d/c better but still present. She wears a pad due to urinary leakage. She did complete metrogel but feels like she may need and additional course of medicine. Her pap revealed no abnormal cells and wet prep was not able to be processed. She has no other concerns.  Right breast irritant contact dermatitis - unchanged. She uses triamcinolone cream daily. Mammogram is UTD   Hyperlipidemia - diet controlled. LDL 109; Tchol 194; HDL 66  Osteoporosis - takes calcium w D. Fosamax stopped several mos ago due to ADRs. Never tried prolia injections. Last DXA in 2016. She has not scheduled repeat DXA in 2019 (order placed in Jan 2019)  HTN - BP stable on amlodipine  GERD/HH/LPR - stable on digestive enzymes qAC and daily protonix. Last EGD in 2015 showed small hiatal hernia but no other abnormality   Hypothyroidism - stable on levothyroxine. TSH 2.06; T4 free 1.3  Tongue paresthesias - overall stable. affects tip of tongue. Uses OTC mouth wash with spicy taste which does not help as much. She has seen ENT Dr Sallee Provencal in the past. She was told she has burning tongue syndrome and to dip tongue into 1 gtt Tabasco sauce +  8 oz water TID and increase gtts to max 3 gtts TID. She has not tried that in a while.   Joint pain - controlled with voltaren gel   Past Medical History:  Diagnosis Date  . Conjunctivitis unspecified   . Disorder of bone and cartilage, unspecified   . GERD (gastroesophageal reflux disease)   . Hepatic lesion   . History of gallstones   . Hyperlipidemia   . Hypertension   . Hypothyroidism   . Kidney stone   . Lacrimal disorder   . Myalgia and myositis, unspecified   . Osteoarthrosis, unspecified whether generalized or localized, unspecified site   . Osteopenia   . Osteoporosis   . Osteoporosis, unspecified   . Other and unspecified hyperlipidemia   . Other dysphagia   . Pain in joint, forearm   . Reflux esophagitis   . Unspecified disorder of kidney and ureter   . Unspecified essential hypertension   . Unspecified hypothyroidism   . Unspecified vitamin D deficiency   . Vitamin D deficiency   . Vitiligo   . Vitiligo     Past Surgical History:  Procedure Laterality Date  . LEG SURGERY     right  . TUBAL LIGATION  1973     reports that she has never smoked. She has never used smokeless tobacco. She reports that she drinks alcohol. She reports that she does not use drugs. Social History  Socioeconomic History  . Marital status: Married    Spouse name: Not on file  . Number of children: 4  . Years of education: Not on file  . Highest education level: Not on file  Occupational History  . Not on file  Social Needs  . Financial resource strain: Not hard at all  . Food insecurity:    Worry: Never true    Inability: Never true  . Transportation needs:    Medical: No    Non-medical: No  Tobacco Use  . Smoking status: Never Smoker  . Smokeless tobacco: Never Used  Substance and Sexual Activity  . Alcohol use: Yes    Alcohol/week: 0.0 standard drinks    Comment: occasional drink  . Drug use: No  . Sexual activity: Never  Lifestyle  . Physical activity:     Days per week: 3 days    Minutes per session: 90 min  . Stress: Only a little  Relationships  . Social connections:    Talks on phone: More than three times a week    Gets together: More than three times a week    Attends religious service: Never    Active member of club or organization: No    Attends meetings of clubs or organizations: Never    Relationship status: Married  . Intimate partner violence:    Fear of current or ex partner: No    Emotionally abused: No    Physically abused: No    Forced sexual activity: No  Other Topics Concern  . Not on file  Social History Narrative  . Not on file    Family History  Problem Relation Age of Onset  . Colon cancer Mother 11  . Diabetes Mother   . Diabetes Sister     No Known Allergies  Outpatient Encounter Medications as of 01/03/2018  Medication Sig  . amLODipine (NORVASC) 5 MG tablet TAKE 1 TABLET BY MOUTH  DAILY  . calcium-vitamin D (OSCAL WITH D) 500-200 MG-UNIT tablet Take 1 tablet by mouth daily with breakfast.  . diclofenac sodium (VOLTAREN) 1 % GEL Apply 2 g topically 4 (four) times daily.  . Digestive Enzymes (ENZYME DIGEST PO) Take 1 tablet ( 500 mg ) by mouth at meal time  For acid reflux  . levothyroxine (SYNTHROID, LEVOTHROID) 75 MCG tablet TAKE 1 TABLET BY MOUTH ONCE DAILY FOR THYROID  . pantoprazole (PROTONIX) 40 MG tablet TAKE 1 TABLET BY MOUTH  DAILY  . [DISCONTINUED] triamcinolone cream (KENALOG) 0.1 % Apply 1 application topically 2 (two) times daily. Apply to right nipple until rash is gone   No facility-administered encounter medications on file as of 01/03/2018.     Review of Systems:  Review of Systems  Genitourinary: Positive for vaginal discharge.  Musculoskeletal: Positive for arthralgias.  All other systems reviewed and are negative.   Health Maintenance  Topic Date Due  . INFLUENZA VACCINE  11/02/2017  . TETANUS/TDAP  10/04/2023 (Originally 11/21/1959)  . DEXA SCAN  Completed  . PNA vac Low  Risk Adult  Completed    Physical Exam: Vitals:   01/03/18 1021  BP: 120/72  Pulse: 66  Temp: 98.5 F (36.9 C)  TempSrc: Oral  SpO2: 97%  Weight: 118 lb 6.4 oz (53.7 kg)  Height: 5\' 2"  (1.575 m)   Body mass index is 21.66 kg/m. Physical Exam  Constitutional: She is oriented to person, place, and time. She appears well-developed and well-nourished.  HENT:  Mouth/Throat: Oropharynx is clear and  moist. No oropharyngeal exudate.  MMM; no oral thrush  Eyes: Pupils are equal, round, and reactive to light. No scleral icterus.  Neck: Neck supple. Carotid bruit is not present. No tracheal deviation present. No thyromegaly present.  Cardiovascular: Normal rate, regular rhythm and intact distal pulses. Exam reveals no gallop and no friction rub.  Murmur (1/6 SEM) heard. No LE edema b/l. no calf TTP.   Pulmonary/Chest: Effort normal and breath sounds normal. No stridor. No respiratory distress. She has no wheezes. She has no rales.  Abdominal: Soft. Normal appearance and bowel sounds are normal. She exhibits no distension and no mass. There is no hepatomegaly. There is no tenderness. There is no rigidity, no rebound and no guarding. No hernia.  Musculoskeletal: She exhibits edema (small and intermediate joints). She exhibits no tenderness.  Lymphadenopathy:    She has no cervical adenopathy.  Neurological: She is alert and oriented to person, place, and time. She has normal reflexes.  Skin: Skin is warm and dry. No rash noted.  Psychiatric: She has a normal mood and affect. Her behavior is normal. Judgment and thought content normal.    Labs reviewed: Basic Metabolic Panel: Recent Labs    04/05/17 1100 07/04/17 1032 10/03/17 1035  NA 139 142 142  K 3.8 4.4 3.9  CL 101 100 106  CO2 28 27 27   GLUCOSE 86 86 91  BUN 15 20 12   CREATININE 0.88 0.88 0.88  CALCIUM 10.2 10.2 9.7  TSH 1.07 1.91 2.06   Liver Function Tests: Recent Labs    04/05/17 1100 07/04/17 1032 10/03/17 1035    ALT 18 15 14    No results for input(s): LIPASE, AMYLASE in the last 8760 hours. No results for input(s): AMMONIA in the last 8760 hours. CBC: No results for input(s): WBC, NEUTROABS, HGB, HCT, MCV, PLT in the last 8760 hours. Lipid Panel: Recent Labs    04/05/17 1100 07/04/17 1032 10/03/17 1035  CHOL 218* 215* 194  HDL 85 61 66  LDLCALC 112* 134* 109*  TRIG 107 92 94  CHOLHDL 2.6 3.5 2.9   No results found for: HGBA1C  Procedures since last visit: No results found.  Assessment/Plan   ICD-10-CM   1. Acute vaginitis N76.0 metroNIDAZOLE (METROGEL VAGINAL) 0.75 % vaginal gel  2. Hypothyroidism due to acquired atrophy of thyroid E03.4   3. Mixed hyperlipidemia E78.2   4. Seasonal allergic rhinitis, unspecified trigger J30.2   5. Essential hypertension, benign I10   6. High risk medication use Z79.899    FLU SHOT GIVEN TODAY  START METROGEL INSERT 1 APPLICATORFUL INTO VAGINA 2 TIMES DAILY FOR 5 DAYS TO TREAT INFECTION  Continue other medications as ordered  May need GYN referral if discharge does not clear up after treatment with metrogel  Follow up in 3 mos with [name] for routine visit. Fasting labs prior to appt (cbc w diff, cmp, lipid panel, tsh, t4 free).  Khila Papp S. Perlie Gold  [facility] [address] [place], [place] [address] [phone] Cell (Monday-Friday 8 AM - 5 PM) [phone] After 5 PM and follow prompts

## 2018-01-09 DIAGNOSIS — H524 Presbyopia: Secondary | ICD-10-CM | POA: Diagnosis not present

## 2018-01-09 DIAGNOSIS — H2513 Age-related nuclear cataract, bilateral: Secondary | ICD-10-CM | POA: Diagnosis not present

## 2018-01-09 DIAGNOSIS — H52201 Unspecified astigmatism, right eye: Secondary | ICD-10-CM | POA: Diagnosis not present

## 2018-01-09 DIAGNOSIS — H5213 Myopia, bilateral: Secondary | ICD-10-CM | POA: Diagnosis not present

## 2018-01-29 ENCOUNTER — Telehealth: Payer: Self-pay | Admitting: Internal Medicine

## 2018-01-29 NOTE — Telephone Encounter (Signed)
I spoke with the patient who said she needs extractions.  She doesn't have dental coverage with [facility] but was told that she would get $1000 for dental services starting in Jan. 2020.  So, I gave her information for [facility], [address], [place], [place] [address], [phone].  I also mailed her an application for [facility] who provides free services if she qualifies.  I did explain that the waitlist can be a year long, but she did agree that I can send it to her. VDM (DD)

## 2018-03-16 ENCOUNTER — Other Ambulatory Visit: Payer: Self-pay | Admitting: *Deleted

## 2018-03-16 MED ORDER — AMLODIPINE BESYLATE 5 MG PO TABS: 5.0000 mg | ORAL_TABLET | Freq: Every day | ORAL | 1 refills | Status: DC

## 2018-03-16 MED ORDER — PANTOPRAZOLE SODIUM 40 MG PO TBEC: 40.0000 mg | DELAYED_RELEASE_TABLET | Freq: Every day | ORAL | 1 refills | Status: DC

## 2018-03-16 MED ORDER — LEVOTHYROXINE SODIUM 75 MCG PO TABS: ORAL_TABLET | ORAL | 1 refills | Status: DC

## 2018-03-16 NOTE — Telephone Encounter (Signed)
[facility] 

## 2018-04-05 ENCOUNTER — Other Ambulatory Visit: Payer: Medicare Other

## 2018-04-12 ENCOUNTER — Encounter: Payer: Self-pay | Admitting: Nurse Practitioner

## 2018-04-12 ENCOUNTER — Ambulatory Visit (INDEPENDENT_AMBULATORY_CARE_PROVIDER_SITE_OTHER): Payer: Medicare Other | Admitting: Nurse Practitioner

## 2018-04-12 VITALS — BP 124/74 | HR 62 | Temp 98.2°F | Ht 62.0 in | Wt 119.4 lb

## 2018-04-12 DIAGNOSIS — I10 Essential (primary) hypertension: Secondary | ICD-10-CM | POA: Diagnosis not present

## 2018-04-12 DIAGNOSIS — E034 Atrophy of thyroid (acquired): Secondary | ICD-10-CM | POA: Diagnosis not present

## 2018-04-12 DIAGNOSIS — M810 Age-related osteoporosis without current pathological fracture: Secondary | ICD-10-CM | POA: Diagnosis not present

## 2018-04-12 DIAGNOSIS — H9193 Unspecified hearing loss, bilateral: Secondary | ICD-10-CM | POA: Diagnosis not present

## 2018-04-12 DIAGNOSIS — E782 Mixed hyperlipidemia: Secondary | ICD-10-CM

## 2018-04-12 LAB — LIPID PANEL
Cholesterol: 206 mg/dL — ABNORMAL HIGH (ref <200)
HDL: 65 mg/dL (ref >50)
LDL Cholesterol (Calc): 124 mg/dL (calc) — ABNORMAL HIGH
Non-HDL Cholesterol (Calc): 141 mg/dL (calc) — ABNORMAL HIGH (ref <130)
Total CHOL/HDL Ratio: 3.2 (calc) (ref <5.0)
Triglycerides: 77 mg/dL (ref <150)

## 2018-04-12 LAB — COMPLETE METABOLIC PANEL WITH GFR
AG Ratio: 1.4 (calc) (ref 1.0–2.5)
ALT: 15 U/L (ref 6–29)
AST: 26 U/L (ref 10–35)
Albumin: 4.4 g/dL (ref 3.6–5.1)
Alkaline phosphatase (APISO): 49 U/L (ref 33–130)
BUN/Creatinine Ratio: 20 (calc) (ref 6–22)
BUN: 20 mg/dL (ref 7–25)
CO2: 27 mmol/L (ref 20–32)
Calcium: 9.7 mg/dL (ref 8.6–10.4)
Chloride: 104 mmol/L (ref 98–110)
Creat: 1.01 mg/dL — ABNORMAL HIGH (ref 0.60–0.93)
GFR, Est African American: 63 mL/min/{1.73_m2} (ref >=60)
GFR, Est Non African American: 54 mL/min/{1.73_m2} — ABNORMAL LOW (ref >=60)
Globulin: 3.1 g/dL (calc) (ref 1.9–3.7)
Glucose, Bld: 83 mg/dL (ref 65–99)
Potassium: 3.6 mmol/L (ref 3.5–5.3)
Sodium: 141 mmol/L (ref 135–146)
Total Bilirubin: 0.4 mg/dL (ref 0.2–1.2)
Total Protein: 7.5 g/dL (ref 6.1–8.1)

## 2018-04-12 LAB — CBC WITH DIFFERENTIAL/PLATELET
Absolute Monocytes: 480 cells/uL (ref 200–950)
Basophils Absolute: 62 cells/uL (ref 0–200)
Basophils Relative: 1.3 %
Eosinophils Absolute: 202 cells/uL (ref 15–500)
Eosinophils Relative: 4.2 %
HCT: 40.9 % (ref 35.0–45.0)
Hemoglobin: 13.2 g/dL (ref 11.7–15.5)
Lymphs Abs: 1704 cells/uL (ref 850–3900)
MCH: 28 pg (ref 27.0–33.0)
MCHC: 32.3 g/dL (ref 32.0–36.0)
MCV: 86.7 fL (ref 80.0–100.0)
MPV: 10.3 fL (ref 7.5–12.5)
Monocytes Relative: 10 %
Neutro Abs: 2352 cells/uL (ref 1500–7800)
Neutrophils Relative %: 49 %
Platelets: 219 10*3/uL (ref 140–400)
RBC: 4.72 10*6/uL (ref 3.80–5.10)
RDW: 12.8 % (ref 11.0–15.0)
Total Lymphocyte: 35.5 %
WBC: 4.8 10*3/uL (ref 3.8–10.8)

## 2018-04-12 LAB — TSH: TSH: 1.98 mIU/L (ref 0.40–4.50)

## 2018-04-12 NOTE — Progress Notes (Signed)
Careteam: Patient Care Team: Lauree Chandler, NP as PCP - General (Geriatric Medicine)  Advanced Directive information Does Patient Have a Medical Advance Directive?: Yes, Type of Advance Directive: [place], Does patient want to make changes to medical advance directive?: No - Patient declined  No Known Allergies  Chief Complaint  Patient presents with  . Medical Management of Chronic Issues    3 month follow up; patient would like for you to look at rash on right breast  . Medication Management    patient would like to discuss medication for acid reflux      HPI: Patient is a 78 y.o. female seen in the office today for routine follow up.    Right breast irritant contact dermatitis - unchanged. She uses triamcinolone cream daily. Mammogram is UTD- July 2019   Hyperlipidemia - diet controlled. LDL 109; Tchol 194; HDL 66  Osteopenic - takes calcium w D. Fosamax stopped several mos ago due to acid reflux. Last dexa was 08/2017  HTN - BP stable on amlodipine 5 mg daily   GERD/HH/LPR - long standing issue- no changes in weight or PO intake. stable on digestive enzymes qAC and daily protonix. Last EGD in 2015 showed small hiatal hernia but no other abnormality. Having a lot of symptoms, was followed by GI and referred to ENT  Hypothyroidism - stable on levothyroxine. TSH 2.06; T4 free 1.3  Joint pain - stable without pain at this time.     Review of Systems:  Review of Systems  Constitutional: Negative for chills, fever and weight loss.  HENT: Positive for hearing loss. Negative for tinnitus.   Respiratory: Negative for cough, sputum production and shortness of breath.   Cardiovascular: Negative for chest pain, palpitations and leg swelling.  Gastrointestinal: Negative for abdominal pain, constipation, diarrhea and heartburn.  Genitourinary: Negative for dysuria, frequency and urgency.  Musculoskeletal: Negative for back pain, falls, joint  pain and myalgias.  Skin: Positive for rash (left small area on breast that comes and goes, stable).  Neurological: Negative for dizziness and headaches.  Psychiatric/Behavioral: Negative for depression and memory loss. The patient does not have insomnia.     Past Medical History:  Diagnosis Date  . Conjunctivitis unspecified   . Disorder of bone and cartilage, unspecified   . GERD (gastroesophageal reflux disease)   . Hepatic lesion   . History of gallstones   . Hyperlipidemia   . Hypertension   . Hypothyroidism   . Kidney stone   . Lacrimal disorder   . Myalgia and myositis, unspecified   . Osteoarthrosis, unspecified whether generalized or localized, unspecified site   . Osteopenia   . Osteoporosis   . Osteoporosis, unspecified   . Other and unspecified hyperlipidemia   . Other dysphagia   . Pain in joint, forearm   . Reflux esophagitis   . Unspecified disorder of kidney and ureter   . Unspecified essential hypertension   . Unspecified hypothyroidism   . Unspecified vitamin D deficiency   . Vitamin D deficiency   . Vitiligo   . Vitiligo    Past Surgical History:  Procedure Laterality Date  . LEG SURGERY     right  . TUBAL LIGATION  1973   Social History:   reports that she has never smoked. She has never used smokeless tobacco. She reports current alcohol use. She reports that she does not use drugs.  Family History  Problem Relation Age of Onset  . Colon cancer  Mother 32  . Diabetes Mother   . Diabetes Sister     Medications: Patient's Medications  New Prescriptions   No medications on file  Previous Medications   AMLODIPINE (NORVASC) 5 MG TABLET    Take 1 tablet (5 mg total) by mouth daily.   CALCIUM-VITAMIN D (OSCAL WITH D) 500-200 MG-UNIT TABLET    Take 1 tablet by mouth daily with breakfast.   LEVOTHYROXINE (SYNTHROID, LEVOTHROID) 75 MCG TABLET    TAKE 1 TABLET BY MOUTH ONCE DAILY FOR THYROID   PANTOPRAZOLE (PROTONIX) 40 MG TABLET    Take 1 tablet  (40 mg total) by mouth daily.  Modified Medications   No medications on file  Discontinued Medications   DICLOFENAC SODIUM (VOLTAREN) 1 % GEL    Apply 2 g topically 4 (four) times daily.   DIGESTIVE ENZYMES (ENZYME DIGEST PO)    Take 1 tablet ( 500 mg ) by mouth at meal time  For acid reflux     Physical Exam:  Vitals:   04/12/18 0823  BP: 124/74  Pulse: 62  Temp: 98.2 F (36.8 C)  TempSrc: Oral  SpO2: 98%  Weight: 119 lb 6.4 oz (54.2 kg)  Height: 5\' 2"  (1.575 m)   Body mass index is 21.84 kg/m.  Physical Exam Constitutional:      Appearance: Normal appearance. She is well-developed.  HENT:     Mouth/Throat:     Pharynx: No oropharyngeal exudate.  Eyes:     General: No scleral icterus.    Pupils: Pupils are equal, round, and reactive to light.  Neck:     Musculoskeletal: Neck supple.     Thyroid: No thyromegaly.     Vascular: No carotid bruit.     Trachea: No tracheal deviation.  Cardiovascular:     Rate and Rhythm: Normal rate and regular rhythm.     Heart sounds: Murmur (1/6 SEM) present. No friction rub. No gallop.      Comments: No LE edema b/l. no calf TTP.  Pulmonary:     Effort: Pulmonary effort is normal. No respiratory distress.     Breath sounds: Normal breath sounds. No stridor. No wheezing or rales.  Abdominal:     General: Bowel sounds are normal. There is no distension.     Palpations: Abdomen is soft. Abdomen is not rigid. There is no hepatomegaly or mass.     Tenderness: There is no abdominal tenderness. There is no guarding or rebound.     Hernia: No hernia is present.  Musculoskeletal:        General: No tenderness.  Lymphadenopathy:     Cervical: No cervical adenopathy.  Skin:    General: Skin is warm and dry.     Findings: No rash.     Comments: Small crusting noted to left nipple  Neurological:     Mental Status: She is alert and oriented to person, place, and time.     Deep Tendon Reflexes: Reflexes are normal and symmetric.    Psychiatric:        Behavior: Behavior normal.        Thought Content: Thought content normal.        Judgment: Judgment normal.     Labs reviewed: Basic Metabolic Panel: Recent Labs    07/04/17 1032 10/03/17 1035  NA 142 142  K 4.4 3.9  CL 100 106  CO2 27 27  GLUCOSE 86 91  BUN 20 12  CREATININE 0.88 0.88  CALCIUM 10.2 9.7  TSH 1.91 2.06   Liver Function Tests: Recent Labs    07/04/17 1032 10/03/17 1035  ALT 15 14   No results for input(s): LIPASE, AMYLASE in the last 8760 hours. No results for input(s): AMMONIA in the last 8760 hours. CBC: No results for input(s): WBC, NEUTROABS, HGB, HCT, MCV, PLT in the last 8760 hours. Lipid Panel: Recent Labs    07/04/17 1032 10/03/17 1035  CHOL 215* 194  HDL 61 66  LDLCALC 134* 109*  TRIG 92 94  CHOLHDL 3.5 2.9   TSH: Recent Labs    07/04/17 1032 10/03/17 1035  TSH 1.91 2.06   A1C: No results found for: HGBA1C   Assessment/Plan 1. Bilateral hearing loss, unspecified hearing loss type -ears clear bilaterally - Ambulatory referral to Audiology for further evaluation of hearing loss  2. Hypothyroidism due to acquired atrophy of thyroid -continues on synthroid 75 mcg daily  - TSH  3. Mixed hyperlipidemia -diet controlled. - Lipid Panel - COMPLETE METABOLIC PANEL WITH GFR  4. Essential hypertension, benign Controlled on amlodipine 5 mg daily  - CBC with Differential/Platelets - COMPLETE METABOLIC PANEL WITH GFR  5. Age-related osteoporosis without current pathological fracture Recent dexa scan showing Osteopenia. Continues on calcium and vit d with weight bearing activity   Next appt: 4 months Ancelmo Hunt K. [place], [facility] [phone]

## 2018-04-12 NOTE — Patient Instructions (Signed)
Make appt in 3 months (or sooner) of AWV and physical exam back to back

## 2018-06-04 DIAGNOSIS — L309 Dermatitis, unspecified: Secondary | ICD-10-CM | POA: Diagnosis not present

## 2018-06-04 DIAGNOSIS — L80 Vitiligo: Secondary | ICD-10-CM | POA: Diagnosis not present

## 2018-07-29 ENCOUNTER — Encounter: Payer: Self-pay | Admitting: Nurse Practitioner

## 2018-08-06 ENCOUNTER — Other Ambulatory Visit: Payer: Self-pay | Admitting: *Deleted

## 2018-08-08 ENCOUNTER — Other Ambulatory Visit: Payer: Self-pay

## 2018-08-08 MED ORDER — CALCIUM CARBONATE-VITAMIN D 500-200 MG-UNIT PO TABS: 1.0000 | ORAL_TABLET | Freq: Every day | ORAL | 11 refills | Status: DC

## 2018-08-08 NOTE — Telephone Encounter (Signed)
Patient requested that I send RX for Oscal to her local pharmacy. I advised that Oscal is OTC and she did not need a prescription. Patient states she would like rx to see if her insurance will cover.  RX sent as requested

## 2018-09-05 ENCOUNTER — Other Ambulatory Visit: Payer: Self-pay

## 2018-09-05 ENCOUNTER — Ambulatory Visit (INDEPENDENT_AMBULATORY_CARE_PROVIDER_SITE_OTHER): Payer: Medicare Other | Admitting: Nurse Practitioner

## 2018-09-05 ENCOUNTER — Encounter: Payer: Self-pay | Admitting: Nurse Practitioner

## 2018-09-05 DIAGNOSIS — I10 Essential (primary) hypertension: Secondary | ICD-10-CM | POA: Diagnosis not present

## 2018-09-05 DIAGNOSIS — M810 Age-related osteoporosis without current pathological fracture: Secondary | ICD-10-CM

## 2018-09-05 DIAGNOSIS — K219 Gastro-esophageal reflux disease without esophagitis: Secondary | ICD-10-CM

## 2018-09-05 DIAGNOSIS — N183 Chronic kidney disease, stage 3 unspecified: Secondary | ICD-10-CM

## 2018-09-05 DIAGNOSIS — E782 Mixed hyperlipidemia: Secondary | ICD-10-CM

## 2018-09-05 DIAGNOSIS — E038 Other specified hypothyroidism: Secondary | ICD-10-CM

## 2018-09-05 DIAGNOSIS — M170 Bilateral primary osteoarthritis of knee: Secondary | ICD-10-CM

## 2018-09-05 NOTE — Patient Instructions (Addendum)
Refer to GI doctor for further evaluation of acid reflux and sensation of things getting stuck in throat.  Continue protonix 40 mg by mouth daily  Food Choices for Gastroesophageal Reflux Disease, Adult When you have gastroesophageal reflux disease (GERD), the foods you eat and your eating habits are very important. Choosing the right foods can help ease your discomfort. Think about working with a nutrition specialist (dietitian) to help you make good choices. What are tips for following this plan?  Meals  Choose healthy foods that are low in fat, such as fruits, vegetables, whole grains, low-fat dairy products, and lean meat, fish, and poultry.  Eat small meals often instead of 3 large meals a day. Eat your meals slowly, and in a place where you are relaxed. Avoid bending over or lying down until 2-3 hours after eating.  Avoid eating meals 2-3 hours before bed.  Avoid drinking a lot of liquid with meals.  Cook foods using methods other than frying. Bake, grill, or broil food instead.  Avoid or limit: ? Chocolate. ? Peppermint or spearmint. ? Alcohol. ? Pepper. ? Black and decaffeinated coffee. ? Black and decaffeinated tea. ? Bubbly (carbonated) soft drinks. ? Caffeinated energy drinks and soft drinks.  Limit high-fat foods such as: ? Fatty meat or fried foods. ? Whole milk, cream, butter, or ice cream. ? Nuts and nut butters. ? Pastries, donuts, and sweets made with butter or shortening.  Avoid foods that cause symptoms. These foods may be different for everyone. Common foods that cause symptoms include: ? Tomatoes. ? Oranges, lemons, and limes. ? Peppers. ? Spicy food. ? Onions and garlic. ? Vinegar. Lifestyle  Maintain a healthy weight. Ask your doctor what weight is healthy for you. If you need to lose weight, work with your doctor to do so safely.  Exercise for at least 30 minutes for 5 or more days each week, or as told by your doctor.  Wear loose-fitting  clothes.  Do not smoke. If you need help quitting, ask your doctor.  Sleep with the head of your bed higher than your feet. Use a wedge under the mattress or blocks under the bed frame to raise the head of the bed. Summary  When you have gastroesophageal reflux disease (GERD), food and lifestyle choices are very important in easing your symptoms.  Eat small meals often instead of 3 large meals a day. Eat your meals slowly, and in a place where you are relaxed.  Limit high-fat foods such as fatty meat or fried foods.  Avoid bending over or lying down until 2-3 hours after eating.  Avoid peppermint and spearmint, caffeine, alcohol, and chocolate. This information is not intended to replace advice given to you by your health care provider. Make sure you discuss any questions you have with your health care provider. Document Released: 09/20/2011 Document Revised: 04/26/2016 Document Reviewed: 04/26/2016 Elsevier Interactive Patient Education  2019 [employer].

## 2018-09-05 NOTE — Progress Notes (Signed)
This service is provided via telemedicine  No vital signs collected/recorded due to the encounter was a telemedicine visit.   Location of patient (ex: home, work): Home  Patient consents to a telephone visit:  Yes   Location of the provider (ex: office, home):  [facility], Office   Name of any referring provider: N/A  Names of all persons participating in the telemedicine service and their role in the encounter: S.Chrae B/CMA, Sherrie Mustache, NP, and Patient   Time spent on call: 5 min with medical assistant      Careteam: Patient Care Team: Lauree Chandler, NP as PCP - General (Geriatric Medicine)  Advanced Directive information Does Patient Have a Medical Advance Directive?: Yes, Type of Advance Directive: [place];Living will, Does patient want to make changes to medical advance directive?: No - Patient declined  No Known Allergies  Chief Complaint  Patient presents with  . Medical Management of Chronic Issues    4-5 month follow-up. Patient c/o trouble with acid reflux. At night patient feels like something is stuck in throat and questions if she needs a pain pill. Telephone visit   . Immunizations    Discuss need for Shingrix      HPI: Patient is a 78 y.o. female for routine follow up  Right breast irritant contact dermatitis - unchanged. Dry skin that is unchanged. She uses triamcinolone cream daily.Mammogramis UTD- July 2019, next month she has appt.   Hyperlipidemia - diet controlled. [id number]; [id number]; HDL 65  Osteopenic - takes calcium w D. Fosamax stopped several mos ago due to acid reflux. Last dexa was 08/2017  HTN - BP stable on amlodipine 5 mg daily, does not check blood pressure.   GERD/HH/LPR - long standing issue- no changes in weight or PO intake. stable on digestive enzymes qAC and daily protonix. Last EGD in 2015 showed small hiatal hernia but no other abnormality. Having a lot of symptoms, was seen by  GI a long time ago but nothing was really resolved. He was sent to ENT but they said everything was fine.  Feels like things are stuck in her throat/esophagus. No trouble eating or drinking, states it is from her GERD. Happens during the day. Not a problem when she eats but feels like things build up in her throat. Overall getting worse.  Hypothyroidism - stable on levothyroxine. TSH2.06; T4 free 1.3  Joint pain - stable without pain at this time. no issues at this time. Not been bothersome lately.   Review of Systems:  Review of Systems  Constitutional: Negative for chills, fever and weight loss.  HENT: Positive for hearing loss. Negative for tinnitus.   Respiratory: Negative for cough, sputum production and shortness of breath.   Cardiovascular: Negative for chest pain, palpitations and leg swelling.  Gastrointestinal: Positive for heartburn. Negative for abdominal pain, constipation and diarrhea.  Genitourinary: Negative for dysuria, frequency and urgency.  Musculoskeletal: Negative for back pain, falls, joint pain and myalgias.  Skin: Positive for rash (left small area on breast that comes and goes, stable).  Neurological: Negative for dizziness and headaches.  Psychiatric/Behavioral: Negative for depression and memory loss. The patient does not have insomnia.     Past Medical History:  Diagnosis Date  . Conjunctivitis unspecified   . Disorder of bone and cartilage, unspecified   . GERD (gastroesophageal reflux disease)   . Hepatic lesion   . History of gallstones   . Hyperlipidemia   . Hypertension   .  Hypothyroidism   . Kidney stone   . Lacrimal disorder   . Myalgia and myositis, unspecified   . Osteoarthrosis, unspecified whether generalized or localized, unspecified site   . Osteopenia   . Osteoporosis   . Osteoporosis, unspecified   . Other and unspecified hyperlipidemia   . Other dysphagia   . Pain in joint, forearm   . Reflux esophagitis   . Unspecified  disorder of kidney and ureter   . Unspecified essential hypertension   . Unspecified hypothyroidism   . Unspecified vitamin D deficiency   . Vitamin D deficiency   . Vitiligo   . Vitiligo    Past Surgical History:  Procedure Laterality Date  . LEG SURGERY     right  . TUBAL LIGATION  1973   Social History:   reports that she has never smoked. She has never used smokeless tobacco. She reports current alcohol use. She reports that she does not use drugs.  Family History  Problem Relation Age of Onset  . Colon cancer Mother 59  . Diabetes Mother   . Diabetes Sister     Medications: Patient's Medications  New Prescriptions   No medications on file  Previous Medications   AMLODIPINE (NORVASC) 5 MG TABLET    Take 1 tablet (5 mg total) by mouth daily.   CALCIUM-VITAMIN D (OSCAL WITH D) 500-200 MG-UNIT TABLET    Take 1 tablet by mouth daily with breakfast.   LEVOTHYROXINE (SYNTHROID, LEVOTHROID) 75 MCG TABLET    TAKE 1 TABLET BY MOUTH ONCE DAILY FOR THYROID   PANTOPRAZOLE (PROTONIX) 40 MG TABLET    Take 1 tablet (40 mg total) by mouth daily.  Modified Medications   No medications on file  Discontinued Medications   No medications on file    Physical Exam:  There were no vitals filed for this visit. There is no height or weight on file to calculate BMI. Wt Readings from Last 3 Encounters:  04/12/18 119 lb 6.4 oz (54.2 kg)  01/03/18 118 lb 6.4 oz (53.7 kg)  12/01/17 117 lb (53.1 kg)     Labs reviewed: Basic Metabolic Panel: Recent Labs    10/03/17 1035 04/12/18 0909  NA 142 141  K 3.9 3.6  CL 106 104  CO2 27 27  GLUCOSE 91 83  BUN 12 20  CREATININE 0.88 1.01*  CALCIUM 9.7 9.7  TSH 2.06 1.98   Liver Function Tests: Recent Labs    10/03/17 1035 04/12/18 0909  AST  --  26  ALT 14 15  BILITOT  --  0.4  PROT  --  7.5   No results for input(s): LIPASE, AMYLASE in the last 8760 hours. No results for input(s): AMMONIA in the last 8760 hours. CBC: Recent  Labs    04/12/18 0909  WBC 4.8  NEUTROABS 2,352  HGB 13.2  HCT 40.9  MCV 86.7  PLT 219   Lipid Panel: Recent Labs    10/03/17 1035 04/12/18 0909  CHOL 194 206*  HDL 66 65  LDLCALC 109* 124*  TRIG 94 77  CHOLHDL 2.9 3.2   TSH: Recent Labs    10/03/17 1035 04/12/18 0909  TSH 2.06 1.98   A1C: No results found for: HGBA1C   Assessment/Plan 1. Gastroesophageal reflux disease, esophagitis presence not specified Reports worsening of symptoms. She has not seen GI in several years. May benefit from follow up due to sensation in throat. No issues with eating or swallowing. Continues on protonix 40 mg daily  -  Ambulatory referral to Gastroenterology  2. Essential hypertension, benign Does not check blood pressure at home, does not have a cuff. Has been well controlled in the past, will continue current norvasc 5 mg daily with dietary modifications.  3. Other specified hypothyroidism TSH at goal on last lab, continue synthroid   4. Age-related osteoporosis without current pathological fracture Unable to tolerate fosamax. Continues on cal with vit d with weight bearing activity.  5. CKD (chronic kidney disease) stage 3, GFR 30-59 ml/min (HCC) Encourage proper hydration and to avoid NSAIDS (Aleve, Advil, Motrin, Ibuprofen)   6. Mixed hyperlipidemia Diet controlled. Will follow up lipids at next office visit.  7. Primary osteoarthritis of both knees Stable, without worsening of pain.   Next appt: 4 months. Carlos American. Harle Battiest  [facility] [phone]    Virtual Visit via Telephone Note  I connected with pt on 09/05/18 at  1:30 PM EDT by telephone and verified that I am speaking with the correct person using two identifiers.  Location: Patient: home Provider: office    I discussed the limitations, risks, security and privacy concerns of performing an evaluation and management service by telephone and the availability of in person  appointments. I also discussed with the patient that there may be a patient responsible charge related to this service. The patient expressed understanding and agreed to proceed.   I discussed the assessment and treatment plan with the patient. The patient was provided an opportunity to ask questions and all were answered. The patient agreed with the plan and demonstrated an understanding of the instructions.   The patient was advised to call back or seek an in-person evaluation if the symptoms worsen or if the condition fails to improve as anticipated.  I provided 21 minutes of non-face-to-face time during this encounter.  Carlos American. Harle Battiest Avs printed and mailed

## 2018-09-06 ENCOUNTER — Ambulatory Visit: Payer: Medicare Other | Admitting: Nurse Practitioner

## 2018-09-18 ENCOUNTER — Other Ambulatory Visit: Payer: Self-pay | Admitting: Nurse Practitioner

## 2018-09-18 DIAGNOSIS — Z1231 Encounter for screening mammogram for malignant neoplasm of breast: Secondary | ICD-10-CM

## 2018-10-08 ENCOUNTER — Other Ambulatory Visit: Payer: Self-pay | Admitting: Internal Medicine

## 2018-11-02 ENCOUNTER — Ambulatory Visit
Admission: RE | Admit: 2018-11-02 | Discharge: 2018-11-02 | Disposition: A | Discharge status: Home / Self Care | Payer: Medicare Other | Source: Ambulatory Visit | Attending: Nurse Practitioner | Admitting: Nurse Practitioner

## 2018-11-02 ENCOUNTER — Other Ambulatory Visit: Payer: Self-pay

## 2018-11-02 DIAGNOSIS — Z1231 Encounter for screening mammogram for malignant neoplasm of breast: Secondary | ICD-10-CM

## 2018-12-05 ENCOUNTER — Ambulatory Visit (INDEPENDENT_AMBULATORY_CARE_PROVIDER_SITE_OTHER): Payer: Medicare Other | Admitting: Nurse Practitioner

## 2018-12-05 ENCOUNTER — Encounter: Payer: Self-pay | Admitting: Nurse Practitioner

## 2018-12-05 ENCOUNTER — Other Ambulatory Visit: Payer: Self-pay

## 2018-12-05 ENCOUNTER — Ambulatory Visit: Payer: Self-pay

## 2018-12-05 VITALS — BP 124/70 | HR 73 | Temp 97.1°F | Ht 61.5 in | Wt 118.6 lb

## 2018-12-05 DIAGNOSIS — N183 Chronic kidney disease, stage 3 unspecified: Secondary | ICD-10-CM

## 2018-12-05 DIAGNOSIS — Z1212 Encounter for screening for malignant neoplasm of rectum: Secondary | ICD-10-CM

## 2018-12-05 DIAGNOSIS — Z1211 Encounter for screening for malignant neoplasm of colon: Secondary | ICD-10-CM

## 2018-12-05 DIAGNOSIS — I10 Essential (primary) hypertension: Secondary | ICD-10-CM

## 2018-12-05 DIAGNOSIS — K219 Gastro-esophageal reflux disease without esophagitis: Secondary | ICD-10-CM | POA: Diagnosis not present

## 2018-12-05 DIAGNOSIS — Z23 Encounter for immunization: Secondary | ICD-10-CM

## 2018-12-05 DIAGNOSIS — E782 Mixed hyperlipidemia: Secondary | ICD-10-CM

## 2018-12-05 DIAGNOSIS — Z0000 Encounter for general adult medical examination without abnormal findings: Secondary | ICD-10-CM

## 2018-12-05 DIAGNOSIS — E038 Other specified hypothyroidism: Secondary | ICD-10-CM

## 2018-12-05 DIAGNOSIS — M8580 Other specified disorders of bone density and structure, unspecified site: Secondary | ICD-10-CM

## 2018-12-05 NOTE — Patient Instructions (Signed)
Tina Roberts , Thank you for taking time to come for your Medicare Wellness Visit. I appreciate your ongoing commitment to your health goals. Please review the following plan we discussed and let me know if I can assist you in the future.   Screening recommendations/referrals: Colonoscopy- referral placed for GI Mammogram up to date Bone Density up to date Recommended yearly ophthalmology/optometry visit for glaucoma screening and checkup Recommended yearly dental visit for hygiene and checkup  Vaccinations: Influenza vaccine- GIVEN Today Pneumococcal vaccine -up to date Tdap vaccine - ask about this vaccine at your pharmacy Shingles vaccine -ask to get this vaccine at your pharmacy    Advanced directives: on file  Conditions/risks identified: worsening memory loss   Next appointment: 1 year   Preventive Care 77 Years and Older, Female Preventive care refers to lifestyle choices and visits with your health care provider that can promote health and wellness. What does preventive care include?  A yearly physical exam. This is also called an annual well check.  Dental exams once or twice a year.  Routine eye exams. Ask your health care provider how often you should have your eyes checked.  Personal lifestyle choices, including:  Daily care of your teeth and gums.  Regular physical activity.  Eating a healthy diet.  Avoiding tobacco and drug use.  Limiting alcohol use.  Practicing safe sex.  Taking low-dose aspirin every day.  Taking vitamin and mineral supplements as recommended by your health care provider. What happens during an annual well check? The services and screenings done by your health care provider during your annual well check will depend on your age, overall health, lifestyle risk factors, and family history of disease. Counseling  Your health care provider may ask you questions about your:  Alcohol use.  Tobacco use.  Drug use.  Emotional  well-being.  Home and relationship well-being.  Sexual activity.  Eating habits.  History of falls.  Memory and ability to understand (cognition).  Work and work Statistician.  Reproductive health. Screening  You may have the following tests or measurements:  Height, weight, and BMI.  Blood pressure.  Lipid and cholesterol levels. These may be checked every 5 years, or more frequently if you are over 77 years old.  Skin check.  Lung cancer screening. You may have this screening every year starting at age 40 if you have a 30-pack-year history of smoking and currently smoke or have quit within the past 15 years.  Fecal occult blood test (FOBT) of the stool. You may have this test every year starting at age 40.  Flexible sigmoidoscopy or colonoscopy. You may have a sigmoidoscopy every 5 years or a colonoscopy every 10 years starting at age 77.  Hepatitis C blood test.  Hepatitis B blood test.  Sexually transmitted disease (STD) testing.  Diabetes screening. This is done by checking your blood sugar (glucose) after you have not eaten for a while (fasting). You may have this done every 1-3 years.  Bone density scan. This is done to screen for osteoporosis. You may have this done starting at age 77.  Mammogram. This may be done every 1-2 years. Talk to your health care provider about how often you should have regular mammograms. Talk with your health care provider about your test results, treatment options, and if necessary, the need for more tests. Vaccines  Your health care provider may recommend certain vaccines, such as:  Influenza vaccine. This is recommended every year.  Tetanus, diphtheria, and acellular pertussis (  Tdap, Td) vaccine. You may need a Td booster every 10 years.  Zoster vaccine. You may need this after age 13.  Pneumococcal 13-valent conjugate (PCV13) vaccine. One  dose is recommended after age 77.  Pneumococcal polysaccharide (PPSV23) vaccine. One  dose is recommended after age 77. Talk to your health care provider about which screenings and vaccines you need and how often you need them. This information is not intended to replace advice given to you by your health care provider. Make sure you discuss any questions you have with your health care provider. Document Released: 04/17/2015 Document Revised: 12/09/2015 Document Reviewed: 01/20/2015 Elsevier Interactive Patient Education  2017 [place] Prevention in the Home Falls can cause injuries. They can happen to people of all ages. There are many things you can do to make your home safe and to help prevent falls. What can I do on the outside of my home?  Regularly fix the edges of walkways and driveways and fix any cracks.  Remove anything that might make you trip as you walk through a door, such as a raised step or threshold.  Trim any bushes or trees on the path to your home.  Use bright outdoor lighting.  Clear any walking paths of anything that might make someone trip, such as rocks or tools.  Regularly check to see if handrails are loose or broken. Make sure that both sides of any steps have handrails.  Any raised decks and porches should have guardrails on the edges.  Have any leaves, snow, or ice cleared regularly.  Use sand or salt on walking paths during winter.  Clean up any spills in your garage right away. This includes oil or grease spills. What can I do in the bathroom?  Use night lights.  Install grab bars by the toilet and in the tub and shower. Do not use towel bars as grab bars.  Use non-skid mats or decals in the tub or shower.  If you need to sit down in the shower, use a plastic, non-slip stool.  Keep the floor dry. Clean up any water that spills on the floor as soon as it happens.  Remove soap buildup in the tub or shower regularly.  Attach bath mats securely with double-sided non-slip rug tape.  Do not have throw rugs and other  things on the floor that can make you trip. What can I do in the bedroom?  Use night lights.  Make sure that you have a light by your bed that is easy to reach.  Do not use any sheets or blankets that are too big for your bed. They should not hang down onto the floor.  Have a firm chair that has side arms. You can use this for support while you get dressed.  Do not have throw rugs and other things on the floor that can make you trip. What can I do in the kitchen?  Clean up any spills right away.  Avoid walking on wet floors.  Keep items that you use a lot in easy-to-reach places.  If you need to reach something above you, use a strong step stool that has a grab bar.  Keep electrical cords out of the way.  Do not use floor polish or wax that makes floors slippery. If you must use wax, use non-skid floor wax.  Do not have throw rugs and other things on the floor that can make you trip. What can I do with my stairs?  Do  not leave any items on the stairs.  Make sure that there are handrails on both sides of the stairs and use them. Fix handrails that are broken or loose. Make sure that handrails are as long as the stairways.  Check any carpeting to make sure that it is firmly attached to the stairs. Fix any carpet that is loose or worn.  Avoid having throw rugs at the top or bottom of the stairs. If you do have throw rugs, attach them to the floor with carpet tape.  Make sure that you have a light switch at the top of the stairs and the bottom of the stairs. If you do not have them, ask someone to add them for you. What else can I do to help prevent falls?  Wear shoes that:  Do not have high heels.  Have rubber bottoms.  Are comfortable and fit you well.  Are closed at the toe. Do not wear sandals.  If you use a stepladder:  Make sure that it is fully opened. Do not climb a closed stepladder.  Make sure that both sides of the stepladder are locked into place.  Ask  someone to hold it for you, if possible.  Clearly mark and make sure that you can see:  Any grab bars or handrails.  First and last steps.  Where the edge of each step is.  Use tools that help you move around (mobility aids) if they are needed. These include:  Canes.  Walkers.  Scooters.  Crutches.  Turn on the lights when you go into a dark area. Replace any light bulbs as soon as they burn out.  Set up your furniture so you have a clear path. Avoid moving your furniture around.  If any of your floors are uneven, fix them.  If there are any pets around you, be aware of where they are.  Review your medicines with your doctor. Some medicines can make you feel dizzy. This can increase your chance of falling. Ask your doctor what other things that you can do to help prevent falls. This information is not intended to replace advice given to you by your health care provider. Make sure you discuss any questions you have with your health care provider. Document Released: 01/15/2009 Document Revised: 08/27/2015 Document Reviewed: 04/25/2014 Elsevier Interactive Patient Education  2017 [employer].

## 2018-12-05 NOTE — Patient Instructions (Addendum)
[facility] in [place], [place] Address: [phone], [place], [place] [address] Phone: [phone]  Please call gastroenterologist office and set up appt for COLONOSCOPY AND increase in ACID REFLUX [phone]  To cancel follow up in October and make 4 month follow up   Health Maintenance, Female Adopting a healthy lifestyle and getting preventive care are important in promoting health and wellness. Ask your health care provider about:  The right schedule for you to have regular tests and exams.  Things you can do on your own to prevent diseases and keep yourself healthy. What should I know about diet, weight, and exercise? Eat a healthy diet   Eat a diet that includes plenty of vegetables, fruits, low-fat dairy products, and lean protein.  Do not eat a lot of foods that are high in solid fats, added sugars, or sodium. Maintain a healthy weight Body mass index (BMI) is used to identify weight problems. It estimates body fat based on height and weight. Your health care provider can help determine your BMI and help you achieve or maintain a healthy weight. Get regular exercise Get regular exercise. This is one of the most important things you can do for your health. Most adults should:  Exercise for at least 150 minutes each week. The exercise should increase your heart rate and make you sweat (moderate-intensity exercise).  Do strengthening exercises at least twice a week. This is in addition to the moderate-intensity exercise.  Spend less time sitting. Even light physical activity can be beneficial. Watch cholesterol and blood lipids Have your blood tested for lipids and cholesterol at 77 years of age, then have this test every 5 years. Have your cholesterol levels checked more often if:  Your lipid or cholesterol levels are high.  You are older than 77 years of age.  You are at high risk for heart disease. What should I know about cancer  screening? Depending on your health history and family history, you may need to have cancer screening at various ages. This may include screening for:  Breast cancer.  Cervical cancer.  Colorectal cancer.  Skin cancer.  Lung cancer. What should I know about heart disease, diabetes, and high blood pressure? Blood pressure and heart disease  High blood pressure causes heart disease and increases the risk of stroke. This is more likely to develop in people who have high blood pressure readings, are of African descent, or are overweight.  Have your blood pressure checked: ? Every 3-5 years if you are 19-33 years of age. ? Every year if you are 6 years old or older. Diabetes Have regular diabetes screenings. This checks your fasting blood sugar level. Have the screening done:  Once every three years after age 52 if you are at a normal weight and have a low risk for diabetes.  More often and at a younger age if you are overweight or have a high risk for diabetes. What should I know about preventing infection? Hepatitis B If you have a higher risk for hepatitis B, you should be screened for this virus. Talk with your health care provider to find out if you are at risk for hepatitis B infection. Hepatitis C Testing is recommended for:  Everyone born from [birth year] through 1964.  Anyone with known risk factors for hepatitis C. Sexually transmitted infections (STIs)  Get screened for STIs, including gonorrhea and chlamydia, if: ? You are sexually active and are younger than 77 years of age. ? You  are older than 77 years of age and your health care provider tells you that you are at risk for this type of infection. ? Your sexual activity has changed since you were last screened, and you are at increased risk for chlamydia or gonorrhea. Ask your health care provider if you are at risk.  Ask your health care provider about whether you are at high risk for HIV. Your health care provider may  recommend a prescription medicine to help prevent HIV infection. If you choose to take medicine to prevent HIV, you should first get tested for HIV. You should then be tested every 3 months for as long as you are taking the medicine. Pregnancy  If you are about to stop having your period (premenopausal) and you may become pregnant, seek counseling before you get pregnant.  Take 400 to 800 micrograms (mcg) of folic acid every day if you become pregnant.  Ask for birth control (contraception) if you want to prevent pregnancy. Osteoporosis and menopause Osteoporosis is a disease in which the bones lose minerals and strength with aging. This can result in bone fractures. If you are 57 years old or older, or if you are at risk for osteoporosis and fractures, ask your health care provider if you should:  Be screened for bone loss.  Take a calcium or vitamin D supplement to lower your risk of fractures.  Be given hormone replacement therapy (HRT) to treat symptoms of menopause. Follow these instructions at home: Lifestyle  Do not use any products that contain nicotine or tobacco, such as cigarettes, e-cigarettes, and chewing tobacco. If you need help quitting, ask your health care provider.  Do not use street drugs.  Do not share needles.  Ask your health care provider for help if you need support or information about quitting drugs. Alcohol use  Do not drink alcohol if: ? Your health care provider tells you not to drink. ? You are pregnant, may be pregnant, or are planning to become pregnant.  If you drink alcohol: ? Limit how much you use to 0-1 drink a day. ? Limit intake if you are breastfeeding.  Be aware of how much alcohol is in your drink. In the [place] one drink equals one 12 oz bottle of beer (355 mL), one 5 oz glass of wine (148 mL), or one 1 oz glass of hard liquor (44 mL). General instructions  Schedule regular health, dental, and eye exams.  Stay current with your  vaccines.  Tell your health care provider if: ? You often feel depressed. ? You have ever been abused or do not feel safe at home. Summary  Adopting a healthy lifestyle and getting preventive care are important in promoting health and wellness.  Follow your health care provider's instructions about healthy diet, exercising, and getting tested or screened for diseases.  Follow your health care provider's instructions on monitoring your cholesterol and blood pressure. This information is not intended to replace advice given to you by your health care provider. Make sure you discuss any questions you have with your health care provider. Document Released: 10/04/2010 Document Revised: 03/14/2018 Document Reviewed: 03/14/2018 Elsevier Patient Education  2020 [employer].

## 2018-12-05 NOTE — Progress Notes (Signed)
Provider: Lauree Chandler, NP  Patient Care Team: Lauree Chandler, NP as PCP - General (Geriatric Medicine)  Extended Emergency Contact Information Primary Emergency Contact: Andrew,Geraldine Address: [address]          [place], [place] Phone: [phone] Relation: Sister Secondary Emergency Contact: Wong,Lonnie Address: [address]           [place], [place] [address] Johnnette Litter of [place] Phone: [phone] Relation: Son No Known Allergies Code Status: FULL Goals of Care: Advanced Directive information Advanced Directives 12/05/2018  Does Patient Have a Medical Advance Directive? Yes  Type of Advance Directive [place]  Does patient want to make changes to medical advance directive? No - Patient declined  Copy of [place] in Chart? Yes - validated most recent copy scanned in chart (See row information)  Would patient like information on creating a medical advance directive? -     Chief Complaint  Patient presents with  . Annual Exam    Yearly physical  . Medication Management    Protonix is ineffective, discuss alternative   . Rash    Ongoing rash on breast     HPI: Patient is a 78 y.o. female seen in today for an annual wellness exam.    Diet? Attempts healthy diet  Exercise? 3 times a week walking   Dentition: can not afford to go, have a few bad teeth  Ophthalmology appt: appt coming up with Dr Richardean Sale in October.   Routine specialist: dermatology  GERD- worsening GERD, reports painful in back of throat.   Depression screen [facility] 2/9 12/05/2018 09/05/2018 12/01/2017 04/05/2017 11/25/2016  Decreased Interest 0 0 0 0 0  Down, Depressed, Hopeless 0 0 0 0 0  PHQ - 2 Score 0 0 0 0 0    Fall Risk  12/05/2018 12/05/2018 09/05/2018 04/12/2018 01/03/2018  Falls in the past year? 0 0 0 0 No  Number falls in past yr: 0 0 0 0 -  Injury with Fall? 0 0 0 0 -   MMSE - Mini Mental State Exam  12/05/2018 12/01/2017 11/25/2016 03/16/2016 01/09/2015  Orientation to time 5 5 5 5 5   Orientation to Place 5 5 5 5 5   Registration 3 3 3 3 3   Attention/ Calculation 3 0 0 0 0  Recall 2 3 1 3 3   Language- name 2 objects 2 2 2 2 2   Language- repeat 1 1 1 1 1   Language- follow 3 step command 2 3 3 3 3   Language- follow 3 step command-comments Folded paper x 2 - - - -  Language- read & follow direction 0 1 1 1 1   Language-read & follow direction-comments read sentence, did not close eyes - - - -  Write a sentence 1 1 1 1 1   Copy design 0 0 0 0 0  Total score 24 24 22 24 24      Health Maintenance  Topic Date Due  . TETANUS/TDAP  10/04/2023 (Originally 11/21/1959)  . INFLUENZA VACCINE  Completed  . DEXA SCAN  Completed  . PNA vac Low Risk Adult  Completed    Past Medical History:  Diagnosis Date  . Conjunctivitis unspecified   . Disorder of bone and cartilage, unspecified   . GERD (gastroesophageal reflux disease)   . Hepatic lesion   . History of gallstones   . Hyperlipidemia   . Hypertension   . Hypothyroidism   . Kidney  stone   . Lacrimal disorder   . Myalgia and myositis, unspecified   . Osteoarthrosis, unspecified whether generalized or localized, unspecified site   . Osteopenia   . Osteoporosis   . Osteoporosis, unspecified   . Other and unspecified hyperlipidemia   . Other dysphagia   . Pain in joint, forearm   . Reflux esophagitis   . Unspecified disorder of kidney and ureter   . Unspecified essential hypertension   . Unspecified hypothyroidism   . Unspecified vitamin D deficiency   . Vitamin D deficiency   . Vitiligo   . Vitiligo     Past Surgical History:  Procedure Laterality Date  . LEG SURGERY     right  . TUBAL LIGATION  1973    Social History   Socioeconomic History  . Marital status: Married    Spouse name: Not on file  . Number of children: 4  . Years of education: Not on file  . Highest education level: Not on file  Occupational History   . Not on file  Social Needs  . Financial resource strain: Not hard at all  . Food insecurity    Worry: Never true    Inability: Never true  . Transportation needs    Medical: No    Non-medical: No  Tobacco Use  . Smoking status: Never Smoker  . Smokeless tobacco: Never Used  Substance and Sexual Activity  . Alcohol use: Yes    Alcohol/week: 0.0 standard drinks    Comment: occasional drink  . Drug use: No  . Sexual activity: Never  Lifestyle  . Physical activity    Days per week: 3 days    Minutes per session: 90 min  . Stress: Only a little  Relationships  . Social connections    Talks on phone: More than three times a week    Gets together: More than three times a week    Attends religious service: Never    Active member of club or organization: No    Attends meetings of clubs or organizations: Never    Relationship status: Married  Other Topics Concern  . Not on file  Social History Narrative  . Not on file    Family History  Problem Relation Age of Onset  . Colon cancer Mother 63  . Diabetes Mother   . Diabetes Sister     Review of Systems:  Review of Systems  Constitutional: Negative for chills, fever and weight loss.  HENT: Positive for hearing loss. Negative for congestion, nosebleeds and tinnitus.        Poor dentition  Respiratory: Negative for cough, sputum production and shortness of breath.   Cardiovascular: Negative for chest pain, palpitations and leg swelling.  Gastrointestinal: Positive for heartburn. Negative for abdominal pain, constipation and diarrhea.  Genitourinary: Negative for dysuria, frequency and urgency.  Musculoskeletal: Negative for back pain, falls, joint pain and myalgias.  Skin: Negative for itching and rash.  Neurological: Negative for dizziness and headaches.  Psychiatric/Behavioral: Negative for depression and memory loss. The patient does not have insomnia.      Allergies as of 12/05/2018   No Known Allergies      Medication List       Accurate as of December 05, 2018 10:51 AM. If you have any questions, ask your nurse or doctor.        amLODipine 5 MG tablet Commonly known as: NORVASC TAKE 1 TABLET BY MOUTH  DAILY   calcium-vitamin D 500-200 MG-UNIT  tablet Commonly known as: OSCAL WITH D Take 1 tablet by mouth daily with breakfast.   levothyroxine 75 MCG tablet Commonly known as: SYNTHROID TAKE 1 TABLET BY MOUTH ONCE DAILY FOR THYROID   pantoprazole 40 MG tablet Commonly known as: PROTONIX TAKE 1 TABLET BY MOUTH  DAILY         Physical Exam: Vitals:   12/05/18 1042  BP: 124/70  Pulse: 73  Temp: (!) 97.1 F (36.2 C)  TempSrc: Temporal  SpO2: 98%  Weight: 118 lb 9.6 oz (53.8 kg)  Height: 5' 1.5" (1.562 m)   Body mass index is 22.05 kg/m. Wt Readings from Last 3 Encounters:  12/05/18 118 lb 9.6 oz (53.8 kg)  12/05/18 118 lb 9.6 oz (53.8 kg)  04/12/18 119 lb 6.4 oz (54.2 kg)    Physical Exam Vitals signs reviewed.  Constitutional:      General: She is not in acute distress.    Appearance: She is well-developed.  HENT:     Head: Normocephalic and atraumatic.     Right Ear: External ear normal.     Left Ear: External ear normal.     Mouth/Throat:     Pharynx: No oropharyngeal exudate.  Eyes:     General: No scleral icterus.    Pupils: Pupils are equal, round, and reactive to light.  Neck:     Musculoskeletal: Normal range of motion and neck supple.     Thyroid: No thyromegaly.     Vascular: No carotid bruit.     Trachea: No tracheal deviation.  Cardiovascular:     Rate and Rhythm: Normal rate and regular rhythm.     Pulses: Normal pulses.     Heart sounds: No murmur. No friction rub. No gallop.   Pulmonary:     Effort: Pulmonary effort is normal. No respiratory distress.     Breath sounds: Normal breath sounds. No decreased breath sounds, wheezing, rhonchi or rales.  Chest:     Chest wall: No tenderness.     Breasts: Breasts are symmetrical.         Right: No inverted nipple, mass, nipple discharge, skin change or tenderness.        Left: No inverted nipple, mass, nipple discharge, skin change or tenderness.  Abdominal:     General: Bowel sounds are normal. There is no distension.     Palpations: Abdomen is soft. There is no hepatomegaly or mass.     Tenderness: There is no abdominal tenderness. There is no guarding or rebound.     Hernia: No hernia is present.  Musculoskeletal:        General: No deformity.  Lymphadenopathy:     Cervical: No cervical adenopathy.  Skin:    General: Skin is warm and dry.     Findings: No rash.     Comments: (+) vitiligo  Neurological:     Mental Status: She is alert and oriented to person, place, and time.     Deep Tendon Reflexes: Reflexes are normal and symmetric.  Psychiatric:        Behavior: Behavior normal.        Thought Content: Thought content normal.        Judgment: Judgment normal.     Labs reviewed: Basic Metabolic Panel: Recent Labs    04/12/18 0909  NA 141  K 3.6  CL 104  CO2 27  GLUCOSE 83  BUN 20  CREATININE 1.01*  CALCIUM 9.7  TSH 1.98   Liver Function Tests:  Recent Labs    04/12/18 0909  AST 26  ALT 15  BILITOT 0.4  PROT 7.5   No results for input(s): LIPASE, AMYLASE in the last 8760 hours. No results for input(s): AMMONIA in the last 8760 hours. CBC: Recent Labs    04/12/18 0909  WBC 4.8  NEUTROABS 2,352  HGB 13.2  HCT 40.9  MCV 86.7  PLT 219   Lipid Panel: Recent Labs    04/12/18 0909  CHOL 206*  HDL 65  LDLCALC 124*  TRIG 77  CHOLHDL 3.2   No results found for: HGBA1C  Procedures: No results found.  Assessment/Plan 1. Gastroesophageal reflux disease, esophagitis presence not specified Ongoing; continues on protonix 40 mg daily, GI office reached out to pt to make appt but was unable to get in touch with her. Number provided today for pt to call and set up follow up.   2. Essential hypertension, benign -blood pressure well  controlled on norvasc - COMPLETE METABOLIC PANEL WITH GFR - CBC with Differential/Platelet  3. Other specified hypothyroidism -Thyroid function has been stable on synthroid 75 mcg.  - TSH  4. CKD (chronic kidney disease) stage 3, GFR 30-59 ml/min (HCC) -Encourage proper hydration and to avoid NSAIDS (Aleve, Advil, Motrin, Ibuprofen)  - COMPLETE METABOLIC PANEL WITH GFR - CBC with Differential/Platelet  5. Mixed hyperlipidemia -continue to work on diet modifications - Lipid Panel - COMPLETE METABOLIC PANEL WITH GFR  6. Osteopenia, unspecified location -continues on cal and vit d with weight bearing activity  7. Wellness -pt is doing well. Completed AWV today. Plans to call GI to set up colonoscopy as well as discuss GERD.  The patient was counseled regarding the appropriate use of alcohol, regular self-examination of the breasts on a monthly basis, prevention of dental and periodontal disease, diet, regular sustained exercise for at least 30 minutes 5 times per week, routine screening interval for mammogram as recommended by the [place] and ACOG, importance of regular PAP smears, tobacco use,  and recommended schedule for GI hemoccult testing, colonoscopy, cholesterol, thyroid and diabetes screening. -information given to her on dental services.   Next appt: 4 months. Carlos American. [place] [phone]

## 2018-12-05 NOTE — Progress Notes (Signed)
Subjective:   Tina Roberts is a 78 y.o. female who presents for Medicare Annual (Subsequent) preventive examination.  Review of Systems:   Cardiac Risk Factors include: dyslipidemia;hypertension;family history of premature cardiovascular disease;advanced age (>1men, >46 women);smoking/ tobacco exposure;sedentary lifestyle     Objective:     Vitals: BP 124/70   Pulse 73   Temp (!) 97.1 F (36.2 C) (Temporal)   Ht 5' 1.5" (1.562 m)   Wt 118 lb 9.6 oz (53.8 kg)   SpO2 98%   BMI 22.05 kg/m   Body mass index is 22.05 kg/m.  Advanced Directives 12/05/2018 09/05/2018 04/12/2018 12/01/2017 10/03/2017 04/05/2017 12/02/2016  Does Patient Have a Medical Advance Directive? Yes Yes Yes Yes Yes Yes Yes  Type of Paramedic of [place];Living will Healthcare Power of Attorney Living will;Healthcare Power of [employer] [place];Living will [place];Living will  Does patient want to make changes to medical advance directive? No - Patient declined No - Patient declined No - Patient declined No - Patient declined No - Patient declined No - Patient declined -  Copy of [place] in Chart? Yes - validated most recent copy scanned in chart (See row information) Yes - validated most recent copy scanned in chart (See row information) Yes - validated most recent copy scanned in chart (See row information) Yes Yes Yes Yes  Would patient like information on creating a medical advance directive? - - - - - - -    Tobacco Social History   Tobacco Use  Smoking Status Never Smoker  Smokeless Tobacco Never Used     Counseling given: Not Answered   Clinical Intake:  Pre-visit preparation completed: Yes        BMI - recorded: 22.05 Nutritional Status: BMI of 19-24  Normal Nutritional Risks: None Diabetes: No  How often do you need to have someone help you when you read  instructions, pamphlets, or other written materials from your doctor or pharmacy?: 4 - Often What is the last grade level you completed in school?: 9th grade        Past Medical History:  Diagnosis Date  . Conjunctivitis unspecified   . Disorder of bone and cartilage, unspecified   . GERD (gastroesophageal reflux disease)   . Hepatic lesion   . History of gallstones   . Hyperlipidemia   . Hypertension   . Hypothyroidism   . Kidney stone   . Lacrimal disorder   . Myalgia and myositis, unspecified   . Osteoarthrosis, unspecified whether generalized or localized, unspecified site   . Osteopenia   . Osteoporosis   . Osteoporosis, unspecified   . Other and unspecified hyperlipidemia   . Other dysphagia   . Pain in joint, forearm   . Reflux esophagitis   . Unspecified disorder of kidney and ureter   . Unspecified essential hypertension   . Unspecified hypothyroidism   . Unspecified vitamin D deficiency   . Vitamin D deficiency   . Vitiligo   . Vitiligo    Past Surgical History:  Procedure Laterality Date  . LEG SURGERY     right  . TUBAL LIGATION  1973   Family History  Problem Relation Age of Onset  . Colon cancer Mother 81  . Diabetes Mother   . Diabetes Sister    Social History   Socioeconomic History  . Marital status: Married    Spouse name: Not on file  .  Number of children: 4  . Years of education: Not on file  . Highest education level: Not on file  Occupational History  . Not on file  Social Needs  . Financial resource strain: Not hard at all  . Food insecurity    Worry: Never true    Inability: Never true  . Transportation needs    Medical: No    Non-medical: No  Tobacco Use  . Smoking status: Never Smoker  . Smokeless tobacco: Never Used  Substance and Sexual Activity  . Alcohol use: Yes    Alcohol/week: 0.0 standard drinks    Comment: occasional drink  . Drug use: No  . Sexual activity: Never  Lifestyle  . Physical activity    Days  per week: 3 days    Minutes per session: 90 min  . Stress: Only a little  Relationships  . Social connections    Talks on phone: More than three times a week    Gets together: More than three times a week    Attends religious service: Never    Active member of club or organization: No    Attends meetings of clubs or organizations: Never    Relationship status: Married  Other Topics Concern  . Not on file  Social History Narrative  . Not on file    Outpatient Encounter Medications as of 12/05/2018  Medication Sig  . amLODipine (NORVASC) 5 MG tablet TAKE 1 TABLET BY MOUTH  DAILY  . calcium-vitamin D (OSCAL WITH D) 500-200 MG-UNIT tablet Take 1 tablet by mouth daily with breakfast.  . levothyroxine (SYNTHROID) 75 MCG tablet TAKE 1 TABLET BY MOUTH ONCE DAILY FOR THYROID  . pantoprazole (PROTONIX) 40 MG tablet TAKE 1 TABLET BY MOUTH  DAILY   No facility-administered encounter medications on file as of 12/05/2018.     Activities of Daily Living In your present state of health, do you have any difficulty performing the following activities: 12/05/2018  Hearing? Y  Vision? N  Difficulty concentrating or making decisions? Y  Walking or climbing stairs? N  Dressing or bathing? N  Doing errands, shopping? N  Preparing Food and eating ? N  Using the Toilet? N  In the past six months, have you accidently leaked urine? N  Do you have problems with loss of bowel control? N  Managing your Medications? N  Managing your Finances? N  Housekeeping or managing your Housekeeping? N  Some recent data might be hidden    Patient Care Team: Lauree Chandler, NP as PCP - General (Geriatric Medicine)    Assessment:   This is a routine wellness examination for Tina Roberts.  Exercise Activities and Dietary recommendations Current Exercise Habits: Home exercise routine, Type of exercise: walking, Time (Minutes): 60, Frequency (Times/Week): 3, Weekly Exercise (Minutes/Week): 180, Intensity: Intense   Goals    . Exercise 3x per week (30 min per time)     Starting 03/16/16, I will maintain my current exercise routine of 3 days per week.     . Maintain Lifestyle     Pt will maintain lifestyle.       Fall Risk Fall Risk  12/05/2018 12/05/2018 09/05/2018 04/12/2018 01/03/2018  Falls in the past year? 0 0 0 0 No  Number falls in past yr: 0 0 0 0 -  Injury with Fall? 0 0 0 0 -   Is the patient's home free of loose throw rugs in walkways, pet beds, electrical cords, etc?   yes  Grab bars in the bathroom? no      Handrails on the stairs?   no stairs      Adequate lighting?   yes  Timed Get Up and Go performed: na  Depression Screen PHQ 2/9 Scores 12/05/2018 09/05/2018 12/01/2017 04/05/2017  PHQ - 2 Score 0 0 0 0     Cognitive Function MMSE - Mini Mental State Exam 12/05/2018 12/01/2017 11/25/2016 03/16/2016 01/09/2015  Orientation to time 5 5 5 5 5   Orientation to Place 5 5 5 5 5   Registration 3 3 3 3 3   Attention/ Calculation 3 0 0 0 0  Recall 2 3 1 3 3   Language- name 2 objects 2 2 2 2 2   Language- repeat 1 1 1 1 1   Language- follow 3 step command 2 3 3 3 3   Language- follow 3 step command-comments Folded paper x 2 - - - -  Language- read & follow direction 0 1 1 1 1   Language-read & follow direction-comments read sentence, did not close eyes - - - -  Write a sentence 1 1 1 1 1   Copy design 0 0 0 0 0  Total score 24 24 22 24 24         Immunization History  Administered Date(s) Administered  . Fluad Quad(high Dose 65+) 12/05/2018  . Influenza Whole 12/03/2012  . Influenza, High Dose Seasonal PF 11/25/2016  . Influenza,inj,Quad PF,6+ Mos 01/29/2014, 01/09/2015, 01/03/2018  . Influenza-Unspecified 01/20/2016  . Pneumococcal Conjugate-13 01/29/2014  . Pneumococcal Polysaccharide-23 11/20/2012    Qualifies for Shingles Vaccine?yes   Screening Tests Health Maintenance  Topic Date Due  . TETANUS/TDAP  10/04/2023 (Originally 11/21/1959)  . INFLUENZA VACCINE  Completed  . DEXA  SCAN  Completed  . PNA vac Low Risk Adult  Completed    Cancer Screenings: Lung: Low Dose CT Chest recommended if Age 39-80 years, 30 pack-year currently smoking OR have quit w/in 15years. Patient does not qualify. Breast:  Up to date on Mammogram? Yes   Up to date of Bone Density/Dexa? Yes Colorectal: NEEDS colonoscopy- order for GI sent  Additional Screenings: Hepatitis C Screening: declines low risk     Plan:    I have personally reviewed and noted the following in the patient's chart:   . Medical and social history . Use of alcohol, tobacco or illicit drugs  . Current medications and supplements . Functional ability and status . Nutritional status . Physical activity . Advanced directives . List of other physicians . Hospitalizations, surgeries, and ER visits in previous 12 months . Vitals . Screenings to include cognitive, depression, and falls . Referrals and appointments  In addition, I have reviewed and discussed with patient certain preventive protocols, quality metrics, and best practice recommendations. A written personalized care plan for preventive services as well as general preventive health recommendations were provided to patient.     Lauree Chandler, NP  12/05/2018

## 2018-12-06 LAB — CBC WITH DIFFERENTIAL/PLATELET
Absolute Monocytes: 419 cells/uL (ref 200–950)
Basophils Absolute: 28 cells/uL (ref 0–200)
Basophils Relative: 0.6 %
Eosinophils Absolute: 101 cells/uL (ref 15–500)
Eosinophils Relative: 2.2 %
HCT: 42.6 % (ref 35.0–45.0)
Hemoglobin: 13.7 g/dL (ref 11.7–15.5)
Lymphs Abs: 1670 cells/uL (ref 850–3900)
MCH: 27.5 pg (ref 27.0–33.0)
MCHC: 32.2 g/dL (ref 32.0–36.0)
MCV: 85.4 fL (ref 80.0–100.0)
MPV: 10.2 fL (ref 7.5–12.5)
Monocytes Relative: 9.1 %
Neutro Abs: 2383 cells/uL (ref 1500–7800)
Neutrophils Relative %: 51.8 %
Platelets: 208 10*3/uL (ref 140–400)
RBC: 4.99 10*6/uL (ref 3.80–5.10)
RDW: 12.9 % (ref 11.0–15.0)
Total Lymphocyte: 36.3 %
WBC: 4.6 10*3/uL (ref 3.8–10.8)

## 2018-12-06 LAB — COMPLETE METABOLIC PANEL WITH GFR
AG Ratio: 1.4 (calc) (ref 1.0–2.5)
ALT: 12 U/L (ref 6–29)
AST: 20 U/L (ref 10–35)
Albumin: 4.3 g/dL (ref 3.6–5.1)
Alkaline phosphatase (APISO): 45 U/L (ref 37–153)
BUN: 15 mg/dL (ref 7–25)
CO2: 28 mmol/L (ref 20–32)
Calcium: 9.8 mg/dL (ref 8.6–10.4)
Chloride: 103 mmol/L (ref 98–110)
Creat: 0.86 mg/dL (ref 0.60–0.93)
GFR, Est African American: 76 mL/min/{1.73_m2} (ref >=60)
GFR, Est Non African American: 65 mL/min/{1.73_m2} (ref >=60)
Globulin: 3.1 g/dL (calc) (ref 1.9–3.7)
Glucose, Bld: 97 mg/dL (ref 65–99)
Potassium: 3.8 mmol/L (ref 3.5–5.3)
Sodium: 140 mmol/L (ref 135–146)
Total Bilirubin: 0.3 mg/dL (ref 0.2–1.2)
Total Protein: 7.4 g/dL (ref 6.1–8.1)

## 2018-12-06 LAB — LIPID PANEL
Cholesterol: 193 mg/dL (ref <200)
HDL: 60 mg/dL (ref >=50)
LDL Cholesterol (Calc): 116 mg/dL (calc) — ABNORMAL HIGH
Non-HDL Cholesterol (Calc): 133 mg/dL (calc) — ABNORMAL HIGH (ref <130)
Total CHOL/HDL Ratio: 3.2 (calc) (ref <5.0)
Triglycerides: 77 mg/dL (ref <150)

## 2018-12-06 LAB — TSH: TSH: 1.55 mIU/L (ref 0.40–4.50)

## 2018-12-07 ENCOUNTER — Encounter: Payer: Self-pay | Admitting: Gastroenterology

## 2018-12-26 ENCOUNTER — Encounter: Payer: Self-pay | Admitting: Gastroenterology

## 2018-12-26 ENCOUNTER — Ambulatory Visit (AMBULATORY_SURGERY_CENTER): Payer: Self-pay | Admitting: *Deleted

## 2018-12-26 ENCOUNTER — Other Ambulatory Visit: Payer: Self-pay

## 2018-12-26 VITALS — Temp 96.8°F | Ht 62.0 in | Wt 121.4 lb

## 2018-12-26 DIAGNOSIS — Z1211 Encounter for screening for malignant neoplasm of colon: Secondary | ICD-10-CM

## 2018-12-26 DIAGNOSIS — Z800 Family history of malignant neoplasm of digestive organs: Secondary | ICD-10-CM

## 2018-12-26 MED ORDER — NA SULFATE-K SULFATE-MG SULF 17.5-3.13-1.6 GM/177ML PO SOLN: 1.0000 | Freq: Once | ORAL | 0 refills | Status: AC

## 2018-12-26 NOTE — Progress Notes (Signed)

## 2018-12-27 ENCOUNTER — Telehealth: Payer: Self-pay | Admitting: Gastroenterology

## 2018-12-27 NOTE — Telephone Encounter (Signed)
Spoke with pt. Pt states the Suprep was over $109.00. Informed pt we will send new instructions for Miralax and Dulcolax prep, which is OTC.   She will call back if she has any questions. Gwyndolyn Saxon in [facility]

## 2018-12-27 NOTE — Telephone Encounter (Signed)
Pt reported that prep prescription is over $100 and requested something cheaper.

## 2019-01-07 ENCOUNTER — Ambulatory Visit: Payer: Medicare Other | Admitting: Nurse Practitioner

## 2019-01-08 ENCOUNTER — Telehealth: Payer: Self-pay

## 2019-01-08 NOTE — Telephone Encounter (Signed)
Covid-19 screening questions   Do you now or have you had a fever in the last 14 days? NO   Do you have any respiratory symptoms of shortness of breath or cough now or in the last 14 days? NO  Do you have any family members or close contacts with diagnosed or suspected Covid-19 in the past 14 days? NO  Have you been tested for Covid-19 and found to be positive? NO        

## 2019-01-09 ENCOUNTER — Other Ambulatory Visit: Payer: Self-pay

## 2019-01-09 ENCOUNTER — Encounter: Payer: Self-pay | Admitting: Gastroenterology

## 2019-01-09 ENCOUNTER — Ambulatory Visit (AMBULATORY_SURGERY_CENTER): Payer: Medicare Other | Admitting: Gastroenterology

## 2019-01-09 VITALS — BP 121/71 | HR 66 | Temp 97.8°F | Resp 15 | Ht 62.0 in | Wt 121.4 lb

## 2019-01-09 DIAGNOSIS — Z1211 Encounter for screening for malignant neoplasm of colon: Secondary | ICD-10-CM

## 2019-01-09 DIAGNOSIS — Z86010 Personal history of colonic polyps: Secondary | ICD-10-CM | POA: Diagnosis not present

## 2019-01-09 DIAGNOSIS — Z800 Family history of malignant neoplasm of digestive organs: Secondary | ICD-10-CM

## 2019-01-09 MED ORDER — SODIUM CHLORIDE 0.9 % IV SOLN: 500.0000 mL | Freq: Once | INTRAVENOUS | Status: DC

## 2019-01-09 NOTE — Patient Instructions (Signed)
No findings!!  No follow-up colonoscopy required!!  Resume current medication regimen as well as diet.  YOU HAD AN ENDOSCOPIC PROCEDURE TODAY AT [facility]:   Refer to the procedure report that was given to you for any specific questions about what was found during the examination.  If the procedure report does not answer your questions, please call your gastroenterologist to clarify.  If you requested that your care partner not be given the details of your procedure findings, then the procedure report has been included in a sealed envelope for you to review at your convenience later.  YOU SHOULD EXPECT: Some feelings of bloating in the abdomen. Passage of more gas than usual.  Walking can help get rid of the air that was put into your GI tract during the procedure and reduce the bloating. If you had a lower endoscopy (such as a colonoscopy or flexible sigmoidoscopy) you may notice spotting of blood in your stool or on the toilet paper. If you underwent a bowel prep for your procedure, you may not have a normal bowel movement for a few days.  Please Note:  You might notice some irritation and congestion in your nose or some drainage.  This is from the oxygen used during your procedure.  There is no need for concern and it should clear up in a day or so.  SYMPTOMS TO REPORT IMMEDIATELY:   Following lower endoscopy (colonoscopy or flexible sigmoidoscopy):  Excessive amounts of blood in the stool  Significant tenderness or worsening of abdominal pains  Swelling of the abdomen that is new, acute  Fever of 100F or higher   For urgent or emergent issues, a gastroenterologist can be reached at any hour by calling [phone].   DIET:  We do recommend a small meal at first, but then you may proceed to your regular diet.  Drink plenty of fluids but you should avoid alcoholic beverages for 24 hours.  ACTIVITY:  You should plan to take it easy for the rest of today and you should  NOT DRIVE or use heavy machinery until tomorrow (because of the sedation medicines used during the test).    FOLLOW UP: Our staff will call the number listed on your records 48-72 hours following your procedure to check on you and address any questions or concerns that you may have regarding the information given to you following your procedure. If we do not reach you, we will leave a message.  We will attempt to reach you two times.  During this call, we will ask if you have developed any symptoms of COVID 19. If you develop any symptoms (ie: fever, flu-like symptoms, shortness of breath, cough etc.) before then, please call [phone].  If you test positive for Covid 19 in the 2 weeks post procedure, please call and report this information to [place].    If any biopsies were taken you will be contacted by phone or by letter within the next 1-3 weeks.  Please call us at [phone] if you have not heard about the biopsies in 3 weeks.    SIGNATURES/CONFIDENTIALITY: You and/or your care partner have signed paperwork which will be entered into your electronic medical record.  These signatures attest to the fact that that the information above on your After Visit Summary has been reviewed and is understood.  Full responsibility of the confidentiality of this discharge information lies with you and/or your care-partner.

## 2019-01-09 NOTE — Op Note (Signed)
[place] Patient Name: Tina Roberts Procedure Date: 01/09/2019 10:13 AM MRN: [id number] Endoscopist: Ladene Artist , MD Age: 78 Referring MD:  Date of Birth: 1940-08-31 Gender: Female Account #: [id number] Procedure:                Colonoscopy Indications:              Screening in patient at increased risk: Family                            history of 1st-degree relative with colorectal                            cancer Medicines:                Monitored Anesthesia Care Procedure:                Pre-Anesthesia Assessment:                           - Prior to the procedure, a History and Physical                            was performed, and patient medications and                            allergies were reviewed. The patient's tolerance of                            previous anesthesia was also reviewed. The risks                            and benefits of the procedure and the sedation                            options and risks were discussed with the patient.                            All questions were answered, and informed consent                            was obtained. Prior Anticoagulants: The patient has                            taken no previous anticoagulant or antiplatelet                            agents. ASA Grade Assessment: II - A patient with                            mild systemic disease. After reviewing the risks                            and benefits, the patient was deemed in  satisfactory condition to undergo the procedure.                           After obtaining informed consent, the colonoscope                            was passed under direct vision. Throughout the                            procedure, the patient's blood pressure, pulse, and                            oxygen saturations were monitored continuously. The                            Colonoscope was introduced through the anus and                      advanced to the the cecum, identified by                            appendiceal orifice and ileocecal valve. The                            ileocecal valve, appendiceal orifice, and rectum                            were photographed. The quality of the bowel                            preparation was good. The colonoscopy was performed                            without difficulty. The patient tolerated the                            procedure well. Scope In: 10:22:18 AM Scope Out: 10:34:15 AM Scope Withdrawal Time: 0 hours 8 minutes 41 seconds  Total Procedure Duration: 0 hours 11 minutes 57 seconds  Findings:                 The perianal and digital rectal examinations were                            normal.                           The entire examined colon appeared normal on direct                            and retroflexion views. Complications:            No immediate complications. Estimated blood loss:                            None. Estimated Blood Loss:     Estimated blood loss: none. Impression:               -  The entire examined colon is normal on direct and                            retroflexion views.                           - No specimens collected. Recommendation:           - Patient has a contact number available for                            emergencies. The signs and symptoms of potential                            delayed complications were discussed with the                            patient. Return to normal activities tomorrow.                            Written discharge instructions were provided to the                            patient.                           - Resume previous diet.                           - Continue present medications.                           - No repeat colonoscopy due to age and the absence                            of colonic polyps. Ladene Artist, MD 01/09/2019 10:36:39 AM This report has been  signed electronically.

## 2019-01-09 NOTE — Progress Notes (Signed)
Temp per [facility] VS per [facility]

## 2019-01-09 NOTE — Progress Notes (Signed)
Report given to PACU, vss 

## 2019-01-10 DIAGNOSIS — H524 Presbyopia: Secondary | ICD-10-CM | POA: Diagnosis not present

## 2019-01-10 DIAGNOSIS — H52201 Unspecified astigmatism, right eye: Secondary | ICD-10-CM | POA: Diagnosis not present

## 2019-01-10 DIAGNOSIS — H5213 Myopia, bilateral: Secondary | ICD-10-CM | POA: Diagnosis not present

## 2019-01-10 DIAGNOSIS — H25813 Combined forms of age-related cataract, bilateral: Secondary | ICD-10-CM | POA: Diagnosis not present

## 2019-01-11 ENCOUNTER — Telehealth: Payer: Self-pay

## 2019-01-11 NOTE — Telephone Encounter (Signed)
  Follow up Call-  Call back number 01/09/2019  Post procedure Call Back phone  # [phone]  Permission to leave phone message No  Some recent data might be hidden     Patient questions:  Do you have a fever, pain , or abdominal swelling? No. Pain Score  0 *  Have you tolerated food without any problems? Yes.    Have you been able to return to your normal activities? Yes.    Do you have any questions about your discharge instructions: Diet   No. Medications  No. Follow up visit  No.  Do you have questions or concerns about your Care? No.  Actions: * If pain score is 4 or above: No action needed, pain <4.  1. Have you developed a fever since your procedure? no  2.   Have you had an respiratory symptoms (SOB or cough) since your procedure? no  3.   Have you tested positive for COVID 19 since your procedure no  4.   Have you had any family members/close contacts diagnosed with the COVID 19 since your procedure?  no   If yes to any of these questions please route to Joylene John, RN and Alphonsa Gin, Therapist, sports.

## 2019-01-11 NOTE — Telephone Encounter (Signed)
Called [phone] and no answer.  Unable to leave  a messaged we tried to reach pt for a follow up call. maw

## 2019-03-24 ENCOUNTER — Other Ambulatory Visit: Payer: Self-pay | Admitting: Nurse Practitioner

## 2019-03-26 ENCOUNTER — Encounter: Payer: Self-pay | Admitting: Nurse Practitioner

## 2019-04-08 ENCOUNTER — Other Ambulatory Visit: Payer: Self-pay

## 2019-04-08 ENCOUNTER — Encounter: Payer: Self-pay | Admitting: Nurse Practitioner

## 2019-04-08 ENCOUNTER — Ambulatory Visit (INDEPENDENT_AMBULATORY_CARE_PROVIDER_SITE_OTHER): Payer: Medicare Other | Admitting: Nurse Practitioner

## 2019-04-08 DIAGNOSIS — E038 Other specified hypothyroidism: Secondary | ICD-10-CM

## 2019-04-08 DIAGNOSIS — I10 Essential (primary) hypertension: Secondary | ICD-10-CM

## 2019-04-08 DIAGNOSIS — M170 Bilateral primary osteoarthritis of knee: Secondary | ICD-10-CM | POA: Diagnosis not present

## 2019-04-08 DIAGNOSIS — E782 Mixed hyperlipidemia: Secondary | ICD-10-CM

## 2019-04-08 DIAGNOSIS — K219 Gastro-esophageal reflux disease without esophagitis: Secondary | ICD-10-CM | POA: Diagnosis not present

## 2019-04-08 MED ORDER — OMEPRAZOLE 20 MG PO CPDR: 20.0000 mg | DELAYED_RELEASE_CAPSULE | Freq: Every day | ORAL | 3 refills | Status: DC

## 2019-04-08 NOTE — Progress Notes (Signed)
This service is provided via telemedicine  No vital signs collected/recorded due to the encounter was a telemedicine visit.   Location of patient (ex: home, work):  Home  Patient consents to a telephone visit:  Yes  Location of the provider (ex: office, home):  [facility], Office   Name of any referring provider: N/A  Names of all persons participating in the telemedicine service and their role in the encounter:  S.Chrae B/CMA, Sherrie Mustache, NP, and Patient   Time spent on call: 5 min with medical assistant       Careteam: Patient Care Team: Lauree Chandler, NP as PCP - General (Geriatric Medicine)  Advanced Directive information    No Known Allergies  Chief Complaint  Patient presents with  . Medication Management    4 month follow-up. Telehealth     HPI: Patient is a 79 y.o. female for routine follow up via telephone visit.   Since last visit pt had colonoscopy  GERD- ongoing, taking an OTC omeprazole as needed which seems to help better than protonix, does not take protonix on days she uses omeprazole.   Hypothyroid- TSH 1.55 on synthroid 75 mcg daily   Osteopenia- continues on cal and vit d with weight bearing activity- at least 3 days a week  Hypertension- does not check blood pressure at home. Continues on norvasc 5 mg daily   Review of Systems:  Review of Systems  Constitutional: Negative for chills, fever and weight loss.  HENT: Negative for tinnitus.   Respiratory: Negative for cough, sputum production and shortness of breath.   Cardiovascular: Negative for chest pain, palpitations and leg swelling.  Gastrointestinal: Positive for heartburn. Negative for abdominal pain, constipation and diarrhea.  Genitourinary: Negative for dysuria, frequency and urgency.  Musculoskeletal: Negative for back pain, falls, joint pain and myalgias.  Skin: Negative.   Neurological: Negative for dizziness, weakness and headaches.    Psychiatric/Behavioral: Negative for depression and memory loss. The patient does not have insomnia.     Past Medical History:  Diagnosis Date  . Conjunctivitis unspecified   . Disorder of bone and cartilage, unspecified   . GERD (gastroesophageal reflux disease)   . Hepatic lesion   . History of gallstones   . Hyperlipidemia   . Hypertension   . Hypothyroidism   . Kidney stone   . Lacrimal disorder   . Myalgia and myositis, unspecified   . Osteoarthrosis, unspecified whether generalized or localized, unspecified site   . Osteopenia   . Osteoporosis   . Osteoporosis, unspecified   . Other and unspecified hyperlipidemia   . Other dysphagia   . Pain in joint, forearm   . Reflux esophagitis   . Unspecified disorder of kidney and ureter   . Unspecified essential hypertension   . Unspecified hypothyroidism   . Unspecified vitamin D deficiency   . Vitamin D deficiency   . Vitiligo   . Vitiligo    Past Surgical History:  Procedure Laterality Date  . COLONOSCOPY    . LEG SURGERY     right  . TUBAL LIGATION  1973   Social History:   reports that she has never smoked. She has never used smokeless tobacco. She reports current alcohol use. She reports that she does not use drugs.  Family History  Problem Relation Age of Onset  . Colon cancer Mother 87  . Diabetes Mother   . Diabetes Sister   . Esophageal cancer Neg Hx   . Rectal cancer  Neg Hx   . Stomach cancer Neg Hx     Medications: Patient's Medications  New Prescriptions   No medications on file  Previous Medications   AMLODIPINE (NORVASC) 5 MG TABLET    TAKE 1 TABLET BY MOUTH  DAILY   CALCIUM-VITAMIN D (OSCAL WITH D) 500-200 MG-UNIT TABLET    Take 1 tablet by mouth daily with breakfast.   LEVOTHYROXINE (SYNTHROID) 75 MCG TABLET    TAKE 1 TABLET BY MOUTH ONCE DAILY FOR THYROID   OMEPRAZOLE (PRILOSEC OTC) 20 MG TABLET    Take 20 mg by mouth as needed.   PANTOPRAZOLE (PROTONIX) 40 MG TABLET    TAKE 1 TABLET BY  MOUTH  DAILY  Modified Medications   No medications on file  Discontinued Medications   No medications on file    Physical Exam:  There were no vitals filed for this visit. There is no height or weight on file to calculate BMI. Wt Readings from Last 3 Encounters:  01/09/19 121 lb 6.4 oz (55.1 kg)  12/26/18 121 lb 6.4 oz (55.1 kg)  12/05/18 118 lb 9.6 oz (53.8 kg)    Labs reviewed: Basic Metabolic Panel: Recent Labs    04/12/18 0909 12/05/18 1113  NA 141 140  K 3.6 3.8  CL 104 103  CO2 27 28  GLUCOSE 83 97  BUN 20 15  CREATININE 1.01* 0.86  CALCIUM 9.7 9.8  TSH 1.98 1.55   Liver Function Tests: Recent Labs    04/12/18 0909 12/05/18 1113  AST 26 20  ALT 15 12  BILITOT 0.4 0.3  PROT 7.5 7.4   No results for input(s): LIPASE, AMYLASE in the last 8760 hours. No results for input(s): AMMONIA in the last 8760 hours. CBC: Recent Labs    04/12/18 0909 12/05/18 1113  WBC 4.8 4.6  NEUTROABS 2,352 2,383  HGB 13.2 13.7  HCT 40.9 42.6  MCV 86.7 85.4  PLT 219 208   Lipid Panel: Recent Labs    04/12/18 0909 12/05/18 1113  CHOL 206* 193  HDL 65 60  LDLCALC 124* 116*  TRIG 77 77  CHOLHDL 3.2 3.2   TSH: Recent Labs    04/12/18 0909 12/05/18 1113  TSH 1.98 1.55   A1C: No results found for: HGBA1C   Assessment/Plan 1. Gastroesophageal reflux disease without esophagitis -on Protonix but feels like omeprazole is helping symptoms better, will STOP Protonix and start omeprazole to see if this controls GERD better.  - omeprazole (PRILOSEC) 20 MG capsule; Take 1 capsule (20 mg total) by mouth daily.  Dispense: 30 capsule; Refill: 3  2. Other specified hypothyroidism TSH at goal 1.55 in September  3. Essential hypertension, benign Blood pressure has been well controlled in the office in the past. Does not check blood pressure at home, goal <140/90, discussed this with pt. Continue on low sodium diet and norvasc 5 mg daily   4. Primary osteoarthritis of  both knees Stable, without increase in pain at this time.   5. Mixed hyperlipidemia Diet controlled, LDL 116 in September, to continue heart healthy diet.   Next appt: 4 months Starling Christofferson K. Harle Battiest  [facility] [phone]    Virtual Visit via Telephone Note  I connected with pt on 04/08/19 at  9:30 AM EST by telephone and verified that I am speaking with the correct person using two identifiers.  Location: Patient: home Provider: office   I discussed the limitations, risks, security and privacy concerns of  performing an evaluation and management service by telephone and the availability of in person appointments. I also discussed with the patient that there may be a patient responsible charge related to this service. The patient expressed understanding and agreed to proceed.   I discussed the assessment and treatment plan with the patient. The patient was provided an opportunity to ask questions and all were answered. The patient agreed with the plan and demonstrated an understanding of the instructions.   The patient was advised to call back or seek an in-person evaluation if the symptoms worsen or if the condition fails to improve as anticipated.  I provided 13 minutes of non-face-to-face time during this encounter.  Carlos American. Harle Battiest Avs printed and mailed

## 2019-04-08 NOTE — Patient Instructions (Signed)
STOP Protonix Start omeprazole daily for acid reflux  Continue to heart healthy diet.

## 2019-06-29 ENCOUNTER — Ambulatory Visit: Payer: Medicare Other

## 2019-08-07 ENCOUNTER — Encounter: Payer: Self-pay | Admitting: Nurse Practitioner

## 2019-08-07 ENCOUNTER — Ambulatory Visit (INDEPENDENT_AMBULATORY_CARE_PROVIDER_SITE_OTHER): Payer: Medicare Other | Admitting: Nurse Practitioner

## 2019-08-07 ENCOUNTER — Other Ambulatory Visit: Payer: Self-pay

## 2019-08-07 VITALS — BP 122/70 | HR 67 | Temp 97.5°F | Ht 62.0 in | Wt 118.0 lb

## 2019-08-07 DIAGNOSIS — K219 Gastro-esophageal reflux disease without esophagitis: Secondary | ICD-10-CM | POA: Diagnosis not present

## 2019-08-07 DIAGNOSIS — M8580 Other specified disorders of bone density and structure, unspecified site: Secondary | ICD-10-CM | POA: Diagnosis not present

## 2019-08-07 DIAGNOSIS — E782 Mixed hyperlipidemia: Secondary | ICD-10-CM

## 2019-08-07 DIAGNOSIS — N183 Chronic kidney disease, stage 3 unspecified: Secondary | ICD-10-CM

## 2019-08-07 DIAGNOSIS — E2839 Other primary ovarian failure: Secondary | ICD-10-CM

## 2019-08-07 DIAGNOSIS — E034 Atrophy of thyroid (acquired): Secondary | ICD-10-CM

## 2019-08-07 DIAGNOSIS — I10 Essential (primary) hypertension: Secondary | ICD-10-CM | POA: Diagnosis not present

## 2019-08-07 MED ORDER — PANTOPRAZOLE SODIUM 40 MG PO TBEC: 40.0000 mg | DELAYED_RELEASE_TABLET | Freq: Every day | ORAL | 1 refills | Status: DC

## 2019-08-07 NOTE — Progress Notes (Deleted)
Careteam: Patient Care Team: Lauree Chandler, NP as PCP - General (Geriatric Medicine)  PLACE OF SERVICE:  [place] Directive information    No Known Allergies  No chief complaint on file.    HPI: Patient is a 79 y.o. female ***  Review of Systems:  ROS***  Past Medical History:  Diagnosis Date  . Conjunctivitis unspecified   . Disorder of bone and cartilage, unspecified   . GERD (gastroesophageal reflux disease)   . Hepatic lesion   . History of gallstones   . Hyperlipidemia   . Hypertension   . Hypothyroidism   . Kidney stone   . Lacrimal disorder   . Myalgia and myositis, unspecified   . Osteoarthrosis, unspecified whether generalized or localized, unspecified site   . Osteopenia   . Osteoporosis   . Osteoporosis, unspecified   . Other and unspecified hyperlipidemia   . Other dysphagia   . Pain in joint, forearm   . Reflux esophagitis   . Unspecified disorder of kidney and ureter   . Unspecified essential hypertension   . Unspecified hypothyroidism   . Unspecified vitamin D deficiency   . Vitamin D deficiency   . Vitiligo   . Vitiligo    Past Surgical History:  Procedure Laterality Date  . COLONOSCOPY    . LEG SURGERY     right  . TUBAL LIGATION  1973   Social History:   reports that she has never smoked. She has never used smokeless tobacco. She reports current alcohol use. She reports that she does not use drugs.  Family History  Problem Relation Age of Onset  . Colon cancer Mother 76  . Diabetes Mother   . Diabetes Sister   . Esophageal cancer Neg Hx   . Rectal cancer Neg Hx   . Stomach cancer Neg Hx     Medications: Patient's Medications  New Prescriptions   No medications on file  Previous Medications   AMLODIPINE (NORVASC) 5 MG TABLET    TAKE 1 TABLET BY MOUTH  DAILY   CALCIUM-VITAMIN D (OSCAL WITH D) 500-200 MG-UNIT TABLET    Take 1 tablet by mouth daily with breakfast.   LEVOTHYROXINE (SYNTHROID) 75 MCG TABLET     TAKE 1 TABLET BY MOUTH ONCE DAILY FOR THYROID   OMEPRAZOLE (PRILOSEC) 20 MG CAPSULE    Take 1 capsule (20 mg total) by mouth daily.  Modified Medications   No medications on file  Discontinued Medications   No medications on file    Physical Exam:  There were no vitals filed for this visit. There is no height or weight on file to calculate BMI. Wt Readings from Last 3 Encounters:  01/09/19 121 lb 6.4 oz (55.1 kg)  12/26/18 121 lb 6.4 oz (55.1 kg)  12/05/18 118 lb 9.6 oz (53.8 kg)    Physical Exam***  Labs reviewed: Basic Metabolic Panel: Recent Labs    12/05/18 1113  NA 140  K 3.8  CL 103  CO2 28  GLUCOSE 97  BUN 15  CREATININE 0.86  CALCIUM 9.8  TSH 1.55   Liver Function Tests: Recent Labs    12/05/18 1113  AST 20  ALT 12  BILITOT 0.3  PROT 7.4   No results for input(s): LIPASE, AMYLASE in the last 8760 hours. No results for input(s): AMMONIA in the last 8760 hours. CBC: Recent Labs    12/05/18 1113  WBC 4.6  NEUTROABS 2,383  HGB 13.7  HCT 42.6  MCV 85.4  PLT 208   Lipid Panel: Recent Labs    12/05/18 1113  CHOL 193  HDL 60  LDLCALC 116*  TRIG 77  CHOLHDL 3.2   TSH: Recent Labs    12/05/18 1113  TSH 1.55   A1C: No results found for: HGBA1C   Assessment/Plan There are no diagnoses linked to this encounter.  Next appt: *** Sarenity Ramaker K. [place], [facility] [phone]

## 2019-08-07 NOTE — Patient Instructions (Signed)

## 2019-08-07 NOTE — Progress Notes (Signed)
Careteam: Patient Care Team: Lauree Chandler, NP as PCP - General (Geriatric Medicine)  PLACE OF SERVICE:  [place] Directive information    No Known Allergies  Chief Complaint  Patient presents with  . Medical Management of Chronic Issues    4 month follow-up, fasting if labs needed.   . Immunizations    Per patient covid vaccines up to date, will bring card to next visit      HPI: Patient is a 79 y.o. female presenting to the office for a 4 month follow up.   HTN- pt is taking her norvasc 5 mg tablet one daily  GERD- pt went back to taking the Protonix daily instead of the Prilosec. She took all of the Prilosec samples that she had, then went back to the Protonix. Pt does take an occasional gas-x for bloating. Reports she has breakthrough GERD every night and burning in her throat. Reports she is complaint with dietary and lifestyle modifications in attempt to reduce acid. Dr Fuller Plan did her colonoscopy   Hypothyroidism- pt is taking her synthroid 75 mg every morning before breakfast  OA- pt is not bothered by any symptoms.   Osteopenia- due for dexa at the end of the month. She is calcium/vit D OTC supplements  CKD stage 3- no concerns  HLD- pt asking if this will be checked today, she tries to eat right but wants to know what "level" it is.  Eyes- pt goes to Dr. Gershon Crane for routine follow up.   Review of Systems:  Review of Systems  Constitutional: Negative for chills, fever and malaise/fatigue.  HENT: Negative for hearing loss.   Eyes: Negative.   Respiratory: Positive for cough (occasional). Negative for shortness of breath.   Cardiovascular: Negative for chest pain and palpitations.  Gastrointestinal: Negative for abdominal pain, heartburn, nausea and vomiting.  Genitourinary: Negative for frequency and urgency.  Musculoskeletal: Positive for myalgias (legs in thighs at times).  Skin: Negative for itching and rash.  Neurological: Negative for  weakness and headaches.  Endo/Heme/Allergies: Does not bruise/bleed easily.  Psychiatric/Behavioral: Negative for depression. The patient is not nervous/anxious.     Past Medical History:  Diagnosis Date  . Conjunctivitis unspecified   . Disorder of bone and cartilage, unspecified   . GERD (gastroesophageal reflux disease)   . Hepatic lesion   . History of gallstones   . Hyperlipidemia   . Hypertension   . Hypothyroidism   . Kidney stone   . Lacrimal disorder   . Myalgia and myositis, unspecified   . Osteoarthrosis, unspecified whether generalized or localized, unspecified site   . Osteopenia   . Osteoporosis   . Osteoporosis, unspecified   . Other and unspecified hyperlipidemia   . Other dysphagia   . Pain in joint, forearm   . Reflux esophagitis   . Unspecified disorder of kidney and ureter   . Unspecified essential hypertension   . Unspecified hypothyroidism   . Unspecified vitamin D deficiency   . Vitamin D deficiency   . Vitiligo   . Vitiligo    Past Surgical History:  Procedure Laterality Date  . COLONOSCOPY    . LEG SURGERY     right  . TUBAL LIGATION  1973   Social History:   reports that she has never smoked. She has never used smokeless tobacco. She reports current alcohol use. She reports that she does not use drugs.  Family History  Problem Relation Age of Onset  . Colon  cancer Mother 15  . Diabetes Mother   . Diabetes Sister   . Esophageal cancer Neg Hx   . Rectal cancer Neg Hx   . Stomach cancer Neg Hx     Medications: Patient's Medications  New Prescriptions   No medications on file  Previous Medications   AMLODIPINE (NORVASC) 5 MG TABLET    TAKE 1 TABLET BY MOUTH  DAILY   CALCIUM-VITAMIN D (OSCAL WITH D) 500-200 MG-UNIT TABLET    Take 1 tablet by mouth daily with breakfast.   LEVOTHYROXINE (SYNTHROID) 75 MCG TABLET    TAKE 1 TABLET BY MOUTH ONCE DAILY FOR THYROID   OMEPRAZOLE (PRILOSEC) 20 MG CAPSULE    Take 1 capsule (20 mg total) by  mouth daily.  Modified Medications   No medications on file  Discontinued Medications   No medications on file    Physical Exam:  Vitals:   08/07/19 0901  BP: 122/70  Pulse: 67  Temp: (!) 97.5 F (36.4 C)  TempSrc: Temporal  SpO2: 99%  Weight: 118 lb (53.5 kg)  Height: 5\' 2"  (1.575 m)   Body mass index is 21.58 kg/m. Wt Readings from Last 3 Encounters:  08/07/19 118 lb (53.5 kg)  01/09/19 121 lb 6.4 oz (55.1 kg)  12/26/18 121 lb 6.4 oz (55.1 kg)    Physical Exam Vitals and nursing note reviewed.  Cardiovascular:     Rate and Rhythm: Normal rate and regular rhythm.     Pulses: Normal pulses.     Heart sounds: Normal heart sounds, S1 normal and S2 normal.  Pulmonary:     Effort: Pulmonary effort is normal.     Breath sounds: Normal breath sounds.  Abdominal:     Palpations: Abdomen is soft.     Tenderness: There is no abdominal tenderness.  Musculoskeletal:     Right lower leg: No edema.     Left lower leg: No edema.  Skin:    General: Skin is warm and dry.  Neurological:     Mental Status: She is alert.  Psychiatric:        Mood and Affect: Mood normal.        Behavior: Behavior normal.        Thought Content: Thought content normal.        Judgment: Judgment normal.     Labs reviewed: Basic Metabolic Panel: Recent Labs    12/05/18 1113  NA 140  K 3.8  CL 103  CO2 28  GLUCOSE 97  BUN 15  CREATININE 0.86  CALCIUM 9.8  TSH 1.55   Liver Function Tests: Recent Labs    12/05/18 1113  AST 20  ALT 12  BILITOT 0.3  PROT 7.4   No results for input(s): LIPASE, AMYLASE in the last 8760 hours. No results for input(s): AMMONIA in the last 8760 hours. CBC: Recent Labs    12/05/18 1113  WBC 4.6  NEUTROABS 2,383  HGB 13.7  HCT 42.6  MCV 85.4  PLT 208   Lipid Panel: Recent Labs    12/05/18 1113  CHOL 193  HDL 60  LDLCALC 116*  TRIG 77  CHOLHDL 3.2   TSH: Recent Labs    12/05/18 1113  TSH 1.55   A1C: No results found for:  HGBA1C   Assessment/Plan 1. Gastroesophageal reflux disease without esophagitis -ongoing GERD, symptom management similar with both protonix and omeprazole with breakthrough GERD daily and having discomfort in throat, also with ongoing burning at night when lying down despite  dietary and lifestyle modifications, more education and information provided however will referral to Gastroenterology for further evaluation.  2. Essential hypertension, benign -stable, Continue Norvasc 5 mg tablet daily with lifestyle modifications.   3. Stage 3 chronic kidney disease, unspecified whether stage 3a or 3b CKD - stable on last labs, Continue hydration and avoid NSAIDs. Will follow up prior to next OV.  4. Osteopenia, unspecified location -Continue Calcium/Vitamin D supplement daily -Encouraged weight bearing activitiy -follow up dexa scan   5. Mixed hyperlipidemia -Continue lifestyle and dietary modifications, not fasting today, will get fasting labs prior to next OV  6. Hypothyroidism due to acquired atrophy of thyroid -Continue Synthroid 75 mg daily before breakfast -TSH prior to next OV.   Next appt: 5 months with fasting lab prior Cari Vandeberg K. Dyneshia Baccam, [facility] [phone]   I personally was present during the history, physical exam and medical decision-making activities of this service and have verified that the service and findings are accurately documented in the student's note

## 2019-08-23 ENCOUNTER — Other Ambulatory Visit: Payer: Self-pay | Admitting: Nurse Practitioner

## 2019-08-23 DIAGNOSIS — E2839 Other primary ovarian failure: Secondary | ICD-10-CM

## 2019-08-23 DIAGNOSIS — M8580 Other specified disorders of bone density and structure, unspecified site: Secondary | ICD-10-CM

## 2019-08-28 ENCOUNTER — Ambulatory Visit (INDEPENDENT_AMBULATORY_CARE_PROVIDER_SITE_OTHER): Payer: Medicare Other | Admitting: Gastroenterology

## 2019-08-28 ENCOUNTER — Encounter: Payer: Self-pay | Admitting: Gastroenterology

## 2019-08-28 VITALS — BP 130/78 | HR 66 | Ht 62.0 in | Wt 121.1 lb

## 2019-08-28 DIAGNOSIS — K146 Glossodynia: Secondary | ICD-10-CM

## 2019-08-28 DIAGNOSIS — K219 Gastro-esophageal reflux disease without esophagitis: Secondary | ICD-10-CM | POA: Diagnosis not present

## 2019-08-28 NOTE — Patient Instructions (Addendum)
You have been scheduled for a follow up visit at [facility] with Dr. Constance Holster on 09/26/19 at 10:00am. If you need to reschedule, please contact there office at [phone].   Thank you for choosing me and [facility].  Pricilla Riffle. Dagoberto Ligas., MD., [facility] Regal

## 2019-08-28 NOTE — Progress Notes (Signed)
    History of Present Illness: This is a 79 year old female who complains of globus sensation, phlegm in her throat, drainage, throat clearing, burning sensation in her tongue.  Her symptoms have not responded to PPI therapy.  EGD performed in September 2015 showed a small hiatal hernia and was otherwise normal.  Barium esophagram in November 2013 showed a small sliding hiatal hernia with evidence of GERD and mild mucosal irregularities in the distal esophagus which were not found at EGD.  Current Medications, Allergies, Past Medical History, Past Surgical History, Family History and Social History were reviewed in [employer] record.   Physical Exam: General: Well developed, well nourished, no acute distress Head: Normocephalic and atraumatic Eyes:  sclerae anicteric, EOMI Ears: Normal auditory acuity Mouth: Not examined, mask on during Covid-19 pandemic Lungs: Clear throughout to auscultation Heart: Regular rate and rhythm; no murmurs, rubs or bruits Abdomen: Soft, non tender and non distended. No masses, hepatosplenomegaly or hernias noted. Normal Bowel sounds Rectal: Not done Musculoskeletal: Symmetrical with no gross deformities  Pulses:  Normal pulses noted Extremities: No clubbing, cyanosis, edema or deformities noted Neurological: Alert oriented x 4, grossly nonfocal Psychological:  Alert and cooperative. Normal mood and affect   Assessment and Recommendations:  1. GERD with suspected LPR. Globus sensation, throat clearing, drainage, phlem and burning sensation in her tongue.  Potential for disorders contributing to symptoms in addition to GERD.  Follow antireflux measures closely and continue pantoprazole 40 mg daily.  Recommend return evaluation with Dr. Janace Hoard, ENT.  Pending results of Dr. Janace Hoard evaluation consider increasing pantoprazole to 40 mg twice daily for 3 months and assess response.  2.  Family history of colon cancer.  Normal colonoscopy in  October 2020.  No plans for future screening colonoscopies due to age.

## 2019-09-02 ENCOUNTER — Other Ambulatory Visit: Payer: Self-pay | Admitting: Nurse Practitioner

## 2019-09-25 ENCOUNTER — Ambulatory Visit
Admission: RE | Admit: 2019-09-25 | Discharge: 2019-09-25 | Disposition: A | Discharge status: Home / Self Care | Payer: Medicare Other | Source: Ambulatory Visit | Attending: Nurse Practitioner | Admitting: Nurse Practitioner

## 2019-09-25 ENCOUNTER — Other Ambulatory Visit: Payer: Self-pay

## 2019-09-25 DIAGNOSIS — M810 Age-related osteoporosis without current pathological fracture: Secondary | ICD-10-CM

## 2019-09-25 DIAGNOSIS — M8580 Other specified disorders of bone density and structure, unspecified site: Secondary | ICD-10-CM

## 2019-09-25 DIAGNOSIS — E2839 Other primary ovarian failure: Secondary | ICD-10-CM

## 2019-09-25 MED ORDER — ALENDRONATE SODIUM 70 MG PO TABS: 70.0000 mg | ORAL_TABLET | ORAL | 6 refills | Status: DC

## 2019-09-30 ENCOUNTER — Other Ambulatory Visit: Payer: Self-pay | Admitting: Nurse Practitioner

## 2019-09-30 DIAGNOSIS — Z1231 Encounter for screening mammogram for malignant neoplasm of breast: Secondary | ICD-10-CM

## 2019-11-04 ENCOUNTER — Other Ambulatory Visit: Payer: Self-pay

## 2019-11-04 ENCOUNTER — Ambulatory Visit
Admission: RE | Admit: 2019-11-04 | Discharge: 2019-11-04 | Disposition: A | Discharge status: Home / Self Care | Payer: Medicare Other | Source: Ambulatory Visit | Attending: Nurse Practitioner | Admitting: Nurse Practitioner

## 2019-11-04 DIAGNOSIS — Z1231 Encounter for screening mammogram for malignant neoplasm of breast: Secondary | ICD-10-CM

## 2019-12-11 ENCOUNTER — Encounter: Payer: Medicare Other | Admitting: Nurse Practitioner

## 2019-12-12 ENCOUNTER — Other Ambulatory Visit: Payer: Self-pay | Admitting: Nurse Practitioner

## 2019-12-12 DIAGNOSIS — K219 Gastro-esophageal reflux disease without esophagitis: Secondary | ICD-10-CM

## 2019-12-17 ENCOUNTER — Other Ambulatory Visit: Payer: Self-pay

## 2019-12-17 ENCOUNTER — Encounter: Payer: Medicare Other | Admitting: Nurse Practitioner

## 2019-12-17 NOTE — Progress Notes (Signed)
err

## 2020-01-07 ENCOUNTER — Other Ambulatory Visit: Payer: Medicare Other

## 2020-01-07 ENCOUNTER — Other Ambulatory Visit: Payer: Medicare Other | Admitting: Nurse Practitioner

## 2020-01-07 ENCOUNTER — Other Ambulatory Visit: Payer: Self-pay

## 2020-01-10 ENCOUNTER — Encounter: Payer: Self-pay | Admitting: Nurse Practitioner

## 2020-01-10 ENCOUNTER — Other Ambulatory Visit: Payer: Self-pay

## 2020-01-10 ENCOUNTER — Ambulatory Visit (INDEPENDENT_AMBULATORY_CARE_PROVIDER_SITE_OTHER): Payer: Medicare Other | Admitting: Nurse Practitioner

## 2020-01-10 VITALS — BP 130/82 | HR 69 | Temp 96.0°F | Ht 62.0 in | Wt 116.0 lb

## 2020-01-10 DIAGNOSIS — N183 Chronic kidney disease, stage 3 unspecified: Secondary | ICD-10-CM

## 2020-01-10 DIAGNOSIS — J302 Other seasonal allergic rhinitis: Secondary | ICD-10-CM

## 2020-01-10 DIAGNOSIS — M170 Bilateral primary osteoarthritis of knee: Secondary | ICD-10-CM

## 2020-01-10 DIAGNOSIS — E034 Atrophy of thyroid (acquired): Secondary | ICD-10-CM

## 2020-01-10 DIAGNOSIS — I10 Essential (primary) hypertension: Secondary | ICD-10-CM

## 2020-01-10 DIAGNOSIS — K219 Gastro-esophageal reflux disease without esophagitis: Secondary | ICD-10-CM | POA: Diagnosis not present

## 2020-01-10 DIAGNOSIS — E782 Mixed hyperlipidemia: Secondary | ICD-10-CM

## 2020-01-10 DIAGNOSIS — Z23 Encounter for immunization: Secondary | ICD-10-CM

## 2020-01-10 DIAGNOSIS — M810 Age-related osteoporosis without current pathological fracture: Secondary | ICD-10-CM

## 2020-01-10 NOTE — Patient Instructions (Addendum)
Can use a mineral oil drop for dry ears.   Follow up 6 months, labs with visit  Make sure you are eating 3 meals a day, can also ADD (low sugar) protein supplement to smallest meal.

## 2020-01-10 NOTE — Progress Notes (Signed)
Careteam: Patient Care Team: Lauree Chandler, NP as PCP - General (Geriatric Medicine)  PLACE OF SERVICE:  [place] Directive information Does Patient Have a Medical Advance Directive?: Yes, Type of Advance Directive: [place];Living will, Does patient want to make changes to medical advance directive?: No - Patient declined  No Known Allergies  Chief Complaint  Patient presents with  . Medical Management of Chronic Issues    5 month follow-up and discuss labs (copy printed). Discuss need for Hep C screening, covid vaccine, and flu vaccine today.      HPI: Patient is a 79 y.o. female for routine follow up  OP- on Caltrate with vit d daily and on fosmax   hypothryroid- TSH stable on synthroid 75 mcg  htn- improved to 130/82 on recheck. Walking more and eating better. Continues on norvasc 5 mg daily  GERD- somewhat worse- contributes this to the fosamax but not bad enough to stop taking. Continues on Protonix  Went to dermatologist due to lesion on her back that was itching, dermatology removed and sent to pathology for evaluation.   Hearing loss- followed by audiologist but can not afford hearing aides.  Review of Systems:  Review of Systems  Constitutional: Negative for chills, fever and weight loss.  HENT: Negative for tinnitus.   Respiratory: Negative for cough, sputum production and shortness of breath.   Cardiovascular: Negative for chest pain, palpitations and leg swelling.  Gastrointestinal: Positive for heartburn. Negative for abdominal pain, constipation and diarrhea.  Genitourinary: Negative for dysuria, frequency and urgency.  Musculoskeletal: Negative for back pain, falls, joint pain and myalgias.  Skin: Positive for itching (skin lesion- saw dermatology yesterday for biopsy).  Neurological: Negative for dizziness and headaches.  Psychiatric/Behavioral: Negative for depression and memory loss. The patient does not have  insomnia.     Past Medical History:  Diagnosis Date  . Conjunctivitis unspecified   . Disorder of bone and cartilage, unspecified   . GERD (gastroesophageal reflux disease)   . Hepatic lesion   . History of gallstones   . Hyperlipidemia   . Hypertension   . Hypothyroidism   . Kidney stone   . Lacrimal disorder   . Myalgia and myositis, unspecified   . Osteoarthrosis, unspecified whether generalized or localized, unspecified site   . Osteopenia   . Osteoporosis   . Osteoporosis, unspecified   . Other and unspecified hyperlipidemia   . Other dysphagia   . Pain in joint, forearm   . Reflux esophagitis   . Unspecified disorder of kidney and ureter   . Unspecified essential hypertension   . Unspecified hypothyroidism   . Unspecified vitamin D deficiency   . Vitamin D deficiency   . Vitiligo   . Vitiligo    Past Surgical History:  Procedure Laterality Date  . COLONOSCOPY    . LEG SURGERY     right  . TUBAL LIGATION  1973   Social History:   reports that she has never smoked. She has never used smokeless tobacco. She reports current alcohol use. She reports that she does not use drugs.  Family History  Problem Relation Age of Onset  . Colon cancer Mother 22  . Diabetes Mother   . Diabetes Sister   . Esophageal cancer Neg Hx   . Rectal cancer Neg Hx   . Stomach cancer Neg Hx     Medications: Patient's Medications  New Prescriptions   No medications on file  Previous Medications  ALENDRONATE (FOSAMAX) 70 MG TABLET    Take 1 tablet (70 mg total) by mouth every 7 (seven) days. Take with a full glass of water on an empty stomach.   AMLODIPINE (NORVASC) 5 MG TABLET    TAKE 1 TABLET BY MOUTH  DAILY   CALCIUM CARB-CHOLECALCIFEROL 600-800 MG-UNIT TABS    Take 1 tablet by mouth daily.   LEVOTHYROXINE (SYNTHROID) 75 MCG TABLET    TAKE 1 TABLET BY MOUTH ONCE DAILY FOR THYROID   PANTOPRAZOLE (PROTONIX) 40 MG TABLET    TAKE 1 TABLET BY MOUTH  DAILY  Modified Medications    No medications on file  Discontinued Medications   CALCIUM-VITAMIN D (OSCAL WITH D) 500-200 MG-UNIT TABLET    Take 1 tablet by mouth daily with breakfast.    Physical Exam:  Vitals:   01/10/20 0933 01/10/20 1002  BP: (!) 142/88 130/82  Pulse: 69   Temp: (!) 96 F (35.6 C)   TempSrc: Temporal   SpO2: 98%   Weight: 116 lb (52.6 kg)   Height: 5\' 2"  (1.575 m)    Body mass index is 21.22 kg/m. Wt Readings from Last 3 Encounters:  01/10/20 116 lb (52.6 kg)  08/28/19 121 lb 2 oz (54.9 kg)  08/07/19 118 lb (53.5 kg)    Physical Exam Constitutional:      General: She is not in acute distress.    Appearance: She is well-developed. She is not diaphoretic.  HENT:     Head: Normocephalic and atraumatic.     Mouth/Throat:     Pharynx: No oropharyngeal exudate.  Eyes:     Conjunctiva/sclera: Conjunctivae normal.     Pupils: Pupils are equal, round, and reactive to light.  Cardiovascular:     Rate and Rhythm: Normal rate and regular rhythm.     Heart sounds: Normal heart sounds.  Pulmonary:     Effort: Pulmonary effort is normal.     Breath sounds: Normal breath sounds.  Abdominal:     General: Bowel sounds are normal.     Palpations: Abdomen is soft.  Musculoskeletal:        General: No tenderness.     Cervical back: Normal range of motion and neck supple.  Skin:    General: Skin is warm and dry.  Neurological:     Mental Status: She is alert and oriented to person, place, and time.  Psychiatric:        Mood and Affect: Mood normal.        Behavior: Behavior normal.     Labs reviewed: Basic Metabolic Panel: Recent Labs    01/07/20 0908  NA 140  K 4.1  CL 105  CO2 27  GLUCOSE 99  BUN 22  CREATININE 0.99*  CALCIUM 9.6  TSH 2.84   Liver Function Tests: Recent Labs    01/07/20 0908  AST 19  ALT 13  BILITOT 0.5  PROT 7.2   No results for input(s): LIPASE, AMYLASE in the last 8760 hours. No results for input(s): AMMONIA in the last 8760  hours. CBC: Recent Labs    01/07/20 0908  WBC 5.5  NEUTROABS 2,552  HGB 13.9  HCT 42.8  MCV 87.0  PLT 221   Lipid Panel: Recent Labs    01/07/20 0908  CHOL 196  HDL 71  LDLCALC 110*  TRIG 60  CHOLHDL 2.8   TSH: Recent Labs    01/07/20 0908  TSH 2.84   A1C: No results found for: HGBA1C  Assessment/Plan 1. Need for influenza vaccination - Flu Vaccine QUAD High Dose(Fluad)  2. Essential hypertension, benign -well controlled on amlodipine   3. Other seasonal allergic rhinitis Controlled. No current symptoms  4. Gastroesophageal reflux disease, unspecified whether esophagitis present Stable on protonix  5. Hypothyroidism due to acquired atrophy of thyroid Stable continues on synthroid.   6. Age-related osteoporosis without current pathological fracture Continues on fosamax, some worsening GERD but does not wish to change or stop medication due to this, encouraged to sit upright after she has taken, first thing in the morning.  To take cal and vit d supplement twice daily   7. Primary osteoarthritis of both knees Improved at this time. Continues exercise.   8. Stage 3 chronic kidney disease, unspecified whether stage 3a or 3b CKD ([place]) Encourage proper hydration and to avoid NSAIDS (Aleve, Advil, Motrin, Ibuprofen)   9. Mixed hyperlipidemia Controlled with diet modifications.   Next appt: 6 months, labs at appt.  Carlos American. Tucker, [facility] [phone]

## 2020-01-15 ENCOUNTER — Other Ambulatory Visit: Payer: Self-pay | Admitting: Nurse Practitioner

## 2020-01-15 DIAGNOSIS — B1920 Unspecified viral hepatitis C without hepatic coma: Secondary | ICD-10-CM

## 2020-01-15 LAB — CBC WITH DIFFERENTIAL/PLATELET
Absolute Monocytes: 424 cells/uL (ref 200–950)
Basophils Absolute: 28 cells/uL (ref 0–200)
Basophils Relative: 0.5 %
Eosinophils Absolute: 110 cells/uL (ref 15–500)
Eosinophils Relative: 2 %
HCT: 42.8 % (ref 35.0–45.0)
Hemoglobin: 13.9 g/dL (ref 11.7–15.5)
Lymphs Abs: 2387 cells/uL (ref 850–3900)
MCH: 28.3 pg (ref 27.0–33.0)
MCHC: 32.5 g/dL (ref 32.0–36.0)
MCV: 87 fL (ref 80.0–100.0)
MPV: 10.5 fL (ref 7.5–12.5)
Monocytes Relative: 7.7 %
Neutro Abs: 2552 cells/uL (ref 1500–7800)
Neutrophils Relative %: 46.4 %
Platelets: 221 10*3/uL (ref 140–400)
RBC: 4.92 10*6/uL (ref 3.80–5.10)
RDW: 13.2 % (ref 11.0–15.0)
Total Lymphocyte: 43.4 %
WBC: 5.5 10*3/uL (ref 3.8–10.8)

## 2020-01-15 LAB — COMPLETE METABOLIC PANEL WITH GFR
AG Ratio: 1.4 (calc) (ref 1.0–2.5)
ALT: 13 U/L (ref 6–29)
AST: 19 U/L (ref 10–35)
Albumin: 4.2 g/dL (ref 3.6–5.1)
Alkaline phosphatase (APISO): 47 U/L (ref 37–153)
BUN/Creatinine Ratio: 22 (calc) (ref 6–22)
BUN: 22 mg/dL (ref 7–25)
CO2: 27 mmol/L (ref 20–32)
Calcium: 9.6 mg/dL (ref 8.6–10.4)
Chloride: 105 mmol/L (ref 98–110)
Creat: 0.99 mg/dL — ABNORMAL HIGH (ref 0.60–0.93)
GFR, Est African American: 63 mL/min/{1.73_m2} (ref >=60)
GFR, Est Non African American: 55 mL/min/{1.73_m2} — ABNORMAL LOW (ref >=60)
Globulin: 3 g/dL (calc) (ref 1.9–3.7)
Glucose, Bld: 99 mg/dL (ref 65–99)
Potassium: 4.1 mmol/L (ref 3.5–5.3)
Sodium: 140 mmol/L (ref 135–146)
Total Bilirubin: 0.5 mg/dL (ref 0.2–1.2)
Total Protein: 7.2 g/dL (ref 6.1–8.1)

## 2020-01-15 LAB — TEST AUTHORIZATION

## 2020-01-15 LAB — HEPATITIS C ANTIBODY
Hepatitis C Ab: REACTIVE — AB
SIGNAL TO CUT-OFF: 28.1 — ABNORMAL HIGH (ref <1.00)

## 2020-01-15 LAB — LIPID PANEL
Cholesterol: 196 mg/dL (ref <200)
HDL: 71 mg/dL (ref >=50)
LDL Cholesterol (Calc): 110 mg/dL (calc) — ABNORMAL HIGH
Non-HDL Cholesterol (Calc): 125 mg/dL (calc) (ref <130)
Total CHOL/HDL Ratio: 2.8 (calc) (ref <5.0)
Triglycerides: 60 mg/dL (ref <150)

## 2020-01-15 LAB — TSH: TSH: 2.84 mIU/L (ref 0.40–4.50)

## 2020-01-15 LAB — HCV RNA,QUANTITATIVE REAL TIME PCR
HCV Quantitative Log: 6.08 Log IU/mL — ABNORMAL HIGH
HCV RNA, PCR, QN: 1210000 IU/mL — ABNORMAL HIGH

## 2020-01-17 ENCOUNTER — Telehealth: Payer: Self-pay

## 2020-01-17 NOTE — Telephone Encounter (Signed)
[facility] Patient Teacher, English as a foreign language completed.    The patient is insured through [employer]. Insurance will pay for [place] with a PA.   I will need to see pt to get income verification and household size.  We will continue to follow to see if copay assistance is needed.  Ileene Patrick, [facility] Patient [facility] for [facility]ious Disease Phone: [phone] Fax:  [phone]

## 2020-01-20 ENCOUNTER — Encounter: Payer: Self-pay | Admitting: Internal Medicine

## 2020-01-20 ENCOUNTER — Ambulatory Visit (INDEPENDENT_AMBULATORY_CARE_PROVIDER_SITE_OTHER): Payer: Medicare Other | Admitting: Internal Medicine

## 2020-01-20 ENCOUNTER — Other Ambulatory Visit: Payer: Self-pay

## 2020-01-20 VITALS — BP 175/98 | HR 67 | Temp 98.0°F | Ht 62.0 in | Wt 119.0 lb

## 2020-01-20 DIAGNOSIS — B182 Chronic viral hepatitis C: Secondary | ICD-10-CM | POA: Insufficient documentation

## 2020-01-20 NOTE — Assessment & Plan Note (Addendum)
New Patient with Chronic Hepatitis C genotype unknown, untreated.   I discussed with the patient the lab findings that confirm chronic hepatitis C.  I discussed the pathogenesis, transmission, prevention, risks of left untreated, and treatment options for hepatitis C. I  discussed the importance and benefits of treatment.  Plan: -- Patient counseled on limiting acetaminophen, avoidance of alcohol. -- Transmission discussed with patient including sexual transmission, injection drug use, sharing razors and toothbrush.   -- Will need referral to gastroenterology if concern for cirrhosis -- Will prescribe appropriate medication based on genotype and coverage  -- Hepatitis A and B titers with vaccination as needed -- Further work up to include liver staging with Fibrosis scoring and calculated FIB-4 score.  Elastography if concern for advanced fibrosis and/or cirrhosis -- Will follow up after starting medication -- Labs needed :  PT/INR Hep A and B serologies  HIV HCV genotype Measure of fibrosis

## 2020-01-20 NOTE — Patient Instructions (Signed)
I am checking some more labs today.  These results will come back over the next couple of weeks.  Within two weeks you should hear from our clinic pharmacist regarding starting treatment for your hepatitis C and the follow up plan from there.  Take care,  [place]

## 2020-01-20 NOTE — Progress Notes (Signed)
[facility] for Infectious Disease   Reason for Consult: Consideration for treatment for chronic hepatitis C Referring Provider: Sherrie Mustache, NP  HPI:   Tina Roberts is a 79 y.o. female who presents for initial evaluation and management of chronic hepatitis C.   Past medical history as below.  Patient tested positive 01/2020.  Hepatitis C-associated risk factors present are: sexual contact with person with liver disease (details: husband had hep C).  Patient denies history of blood transfusion, intranasal drug use, IV drug abuse.  Patient has had other studies performed.  Results: hepatitis C RNA by PCR, result: positive.  Patient has not had prior treatment for Hepatitis C.  Patient does not have a past history of liver disease.  Patient does not have a family history of liver disease.  Patient does not  have associated signs or symptoms related to liver disease.   Labs reviewed and confirm chronic hepatitis C with a positive viral load.    Records reviewed from Epic.  Patient does not have documented immunity to Hepatitis A. Patient does not have documented immunity to Hepatitis B.    Patient's Medications  New Prescriptions   No medications on file  Previous Medications   ALENDRONATE (FOSAMAX) 70 MG TABLET    Take 1 tablet (70 mg total) by mouth every 7 (seven) days. Take with a full glass of water on an empty stomach.   AMLODIPINE (NORVASC) 5 MG TABLET    TAKE 1 TABLET BY MOUTH  DAILY   CALCIUM CARB-CHOLECALCIFEROL 600-800 MG-UNIT TABS    Take 1 tablet by mouth daily.   CLOBETASOL OINTMENT (TEMOVATE) 0.05 %    Apply topically 2 (two) times daily.   DESONIDE (DESOWEN) 0.05 % LOTION    Apply 1 application topically daily.   LEVOTHYROXINE (SYNTHROID) 75 MCG TABLET    TAKE 1 TABLET BY MOUTH ONCE DAILY FOR THYROID   PANTOPRAZOLE (PROTONIX) 40 MG TABLET    TAKE 1 TABLET BY MOUTH  DAILY   PROTOPIC 0.1 % OINTMENT    Apply topically 2 (two) times daily.  Modified  Medications   No medications on file  Discontinued Medications   No medications on file      Past Medical History:  Diagnosis Date  . Conjunctivitis unspecified   . Disorder of bone and cartilage, unspecified   . GERD (gastroesophageal reflux disease)   . Hepatic lesion   . History of gallstones   . Hyperlipidemia   . Hypertension   . Hypothyroidism   . Kidney stone   . Lacrimal disorder   . Myalgia and myositis, unspecified   . Osteoarthrosis, unspecified whether generalized or localized, unspecified site   . Osteopenia   . Osteoporosis   . Osteoporosis, unspecified   . Other and unspecified hyperlipidemia   . Other dysphagia   . Pain in joint, forearm   . Reflux esophagitis   . Unspecified disorder of kidney and ureter   . Unspecified essential hypertension   . Unspecified hypothyroidism   . Unspecified vitamin D deficiency   . Vitamin D deficiency   . Vitiligo   . Vitiligo     Social History   Tobacco Use  . Smoking status: Never Smoker  . Smokeless tobacco: Never Used  Vaping Use  . Vaping Use: Never used  Substance Use Topics  . Alcohol use: Yes    Alcohol/week: 0.0 standard drinks    Comment: occasional drink wine   . Drug use: No  Family History  Problem Relation Age of Onset  . Colon cancer Mother 4  . Diabetes Mother   . Diabetes Sister   . Esophageal cancer Neg Hx   . Rectal cancer Neg Hx   . Stomach cancer Neg Hx     No Known Allergies    Review of Systems: Review of Systems  Constitutional: Negative for chills and fever.  Respiratory: Negative.   Cardiovascular: Negative.   Gastrointestinal: Negative.      Objective: Vitals:   01/20/20 1004  BP: (!) 175/98  Pulse: 67  Temp: 98 F (36.7 C)  TempSrc: Oral  SpO2: 100%  Weight: 119 lb (54 kg)  Height: 5\' 2"  (1.575 m)     Body mass index is 21.77 kg/m.  Physical Exam Constitutional:      General: She is not in acute distress.    Appearance: Normal appearance.    Abdominal:     General: Abdomen is flat. There is no distension.     Palpations: Abdomen is soft.  Neurological:     General: No focal deficit present.     Mental Status: She is alert and oriented to person, place, and time.  Psychiatric:        Mood and Affect: Mood normal.        Behavior: Behavior normal.     Pertinent Labs and Microbiology:   Genotype: No results found for: HCVGENOTYPE HCV viral load: 1,210,000 Lab Results  Component Value Date   WBC 5.5 01/07/2020   HGB 13.9 01/07/2020   HCT 42.8 01/07/2020   MCV 87.0 01/07/2020   PLT 221 01/07/2020    Lab Results  Component Value Date   CREATININE 0.99 (H) 01/07/2020   BUN 22 01/07/2020   NA 140 01/07/2020   K 4.1 01/07/2020   CL 105 01/07/2020   CO2 27 01/07/2020    Lab Results  Component Value Date   ALT 13 01/07/2020   AST 19 01/07/2020   ALKPHOS 48 11/22/2016     Lab Results  Component Value Date   BILITOT 0.5 01/07/2020   ALBUMIN 4.3 11/22/2016    Labs and history reviewed and show FIB-4 score: 1.86.    Interpretation: Using a lower cutoff value of 1.45, a FIB-4 score <1.45 had a negative predictive value of 90% for advanced fibrosis ([place] fibrosis score 4-6 which includes early bridging fibrosis to cirrhosis). In contrast, a FIB-4 >3.25 would have a 97% specificity and a positive predictive value of 65% for advanced fibrosis. In the patient cohort in which this formula was first validated, at least 70% patients had values <1.45 or >3.25. Authors argued that these individuals could potentially have avoided liver biopsy with an overall accuracy of 86%.  Assessment and Plan: Chronic hepatitis C without hepatic coma ([place]) New Patient with Chronic Hepatitis C genotype unknown, untreated.   I discussed with the patient the lab findings that confirm chronic hepatitis C.  I discussed the pathogenesis, transmission, prevention, risks of left untreated, and treatment options for hepatitis C. I  discussed  the importance and benefits of treatment.  Plan: -- Patient counseled on limiting acetaminophen, avoidance of alcohol. -- Transmission discussed with patient including sexual transmission, injection drug use, sharing razors and toothbrush.   -- Will need referral to gastroenterology if concern for cirrhosis -- Will prescribe appropriate medication based on genotype and coverage  -- Hepatitis A and B titers with vaccination as needed -- Further work up to include liver staging with Fibrosis scoring and  calculated FIB-4 score.  Elastography if concern for advanced fibrosis and/or cirrhosis -- Will follow up after starting medication -- Labs needed :  PT/INR Hep A and B serologies  HIV HCV genotype Measure of fibrosis    There are no diagnoses linked to this encounter.   Orders Placed This Encounter  Procedures  . Hepatitis B core antibody, total  . Hepatitis B surface antibody,qualitative  . Hepatitis B surface antigen  . Hepatitis A antibody, total  . Hepatitis C genotype  . Protime-INR  . Liver Fibrosis, FibroTest-ActiTest  . HIV Antibody (routine testing w rflx)        [facility] for [facility] 01/20/2020, 10:23 AM

## 2020-01-25 LAB — LIVER FIBROSIS, FIBROTEST-ACTITEST
ALT: 20 U/L (ref 6–29)
Alpha-2-Macroglobulin: 463 mg/dL — ABNORMAL HIGH (ref 106–279)
Apolipoprotein A1: 210 mg/dL — ABNORMAL HIGH (ref 101–198)
Bilirubin: 0.4 mg/dL (ref 0.2–1.2)
Fibrosis Score: 0.52
GGT: 52 U/L (ref 3–65)
Haptoglobin: 155 mg/dL (ref 43–212)
Necroinflammat ACT Score: 0.11
Reference ID: 3599519

## 2020-01-25 LAB — PROTIME-INR
INR: 1
Prothrombin Time: 10.3 s (ref 9.0–11.5)

## 2020-01-25 LAB — HEPATITIS B SURFACE ANTIBODY,QUALITATIVE: Hep B S Ab: REACTIVE — AB

## 2020-01-25 LAB — HEPATITIS C GENOTYPE

## 2020-01-25 LAB — HEPATITIS B SURFACE ANTIGEN: Hepatitis B Surface Ag: NONREACTIVE

## 2020-01-25 LAB — HIV ANTIBODY (ROUTINE TESTING W REFLEX): HIV 1&2 Ab, 4th Generation: NONREACTIVE

## 2020-01-25 LAB — HEPATITIS A ANTIBODY, TOTAL: Hepatitis A AB,Total: REACTIVE — AB

## 2020-01-25 LAB — HEPATITIS B CORE ANTIBODY, TOTAL: Hep B Core Total Ab: NONREACTIVE

## 2020-01-28 ENCOUNTER — Other Ambulatory Visit: Payer: Self-pay | Admitting: Pharmacist

## 2020-01-28 ENCOUNTER — Telehealth: Payer: Self-pay

## 2020-01-28 DIAGNOSIS — B182 Chronic viral hepatitis C: Secondary | ICD-10-CM

## 2020-01-28 MED ORDER — MAVYRET 100-40 MG PO TABS: 3.0000 | ORAL_TABLET | Freq: Every day | ORAL | 1 refills | Status: DC

## 2020-01-28 NOTE — Telephone Encounter (Signed)
[facility] Patient Advocate Encounter   Received notification from [facility] that prior authorization for MAVYRET is required.   PA submitted on 01/28/20 Key [id number] Status is pending    [facility] will continue to follow.   Ileene Patrick, [facility] Patient [facility] for Infectious Disease Phone: [phone] Fax:  [phone]

## 2020-01-28 NOTE — Progress Notes (Signed)
Sending Mavyret to [facility]. Butch Penny will work on [place].

## 2020-01-29 ENCOUNTER — Telehealth: Payer: Self-pay

## 2020-01-29 NOTE — Telephone Encounter (Signed)
[facility] Patient Advocate Encounter  Prior Authorization for MAVYRET has been approved.    PA# [id number] Effective dates: 01/28/20 through 03/24/20  Patients co-pay is $2836.77.   I will be applying for PAN .  [facility] will continue to follow.  Ileene Patrick, [facility] Patient [facility] for Infectious Disease Phone: [phone] Fax:  [phone]

## 2020-01-29 NOTE — Telephone Encounter (Signed)
[facility] Patient Advocate Encounter   I was successful in securing patient an $6000.00 grant from Patient [employer] [facility]) to provide copayment coverage for [address].  This will make the out of pocket cost $0.00.     I have spoken with the patient.    The billing information is as follows and has been shared with [place].   Member ID: [id number] Group ID: [id number] RxBin: [id number] Dates of Eligibility: 10/31/19 through 01/27/21  Patient knows to call the office with questions or concerns.  Ileene Patrick, [facility] Patient [facility] for Infectious Disease Phone: [phone] Fax:  [phone]

## 2020-01-31 MED FILL — MAVYRET 100-40 MG TABS: 100-40 | 28 days supply | Qty: 84 | Fill #0

## 2020-02-03 ENCOUNTER — Telehealth: Payer: Self-pay | Admitting: Pharmacist

## 2020-02-03 DIAGNOSIS — B182 Chronic viral hepatitis C: Secondary | ICD-10-CM

## 2020-02-03 NOTE — Telephone Encounter (Signed)
Provided Mavyret medication counseling for HCV management. Patient has received medication and plans to start today or tomorrow. Counseled on taking Mavyret 3 tablets once daily with food for a total of 8 weeks. Discussed the importance of medication adherence and to contact [facility] if starting any new medications. Discussed most common side-effects including nausea, fatigue, and headache. Encouraged patient to avoid alcohol intake if possible due to potential liver damage with HCV infection. Patient verbalized understanding. All of her questions and concerns were addressed at this time.

## 2020-03-02 MED FILL — MAVYRET 100-40 MG TABS: 100-40 | 28 days supply | Qty: 84 | Fill #1

## 2020-03-05 ENCOUNTER — Other Ambulatory Visit: Payer: Self-pay | Admitting: Nurse Practitioner

## 2020-03-05 DIAGNOSIS — M810 Age-related osteoporosis without current pathological fracture: Secondary | ICD-10-CM

## 2020-03-09 ENCOUNTER — Ambulatory Visit (INDEPENDENT_AMBULATORY_CARE_PROVIDER_SITE_OTHER): Payer: Medicare Other | Admitting: Pharmacist

## 2020-03-09 ENCOUNTER — Telehealth: Payer: Self-pay | Admitting: Nurse Practitioner

## 2020-03-09 ENCOUNTER — Other Ambulatory Visit: Payer: Self-pay

## 2020-03-09 DIAGNOSIS — B182 Chronic viral hepatitis C: Secondary | ICD-10-CM

## 2020-03-09 NOTE — Progress Notes (Signed)
HPI: Tina Roberts is a 79 y.o. female who presents to the [place] [facility] for Hepatitis C follow-up.  Medication: Mavyret x 8 weeks  Start Date: 02/03/20  Hepatitis C Genotype: 1a  Fibrosis Score: F2  Hepatitis C RNA: 1.2 million on 01/07/20  Patient Active Problem List   Diagnosis Date Noted  . Chronic hepatitis C without hepatic coma ([place]) 01/20/2020  . High risk medication use 10/03/2017  . Memory loss 04/05/2017  . Age-related osteoporosis without current pathological fracture 04/05/2017  . Esophageal reflux 07/10/2015  . Herpes simplex virus infection 07/10/2015  . Other seasonal allergic rhinitis 07/10/2015  . Hypothyroidism due to acquired atrophy of thyroid 07/10/2015  . Globus sensation 01/29/2014  . Hiatal hernia 10/23/2013  . Hematemesis without nausea 10/23/2013  . Excessive salivation 07/03/2013  . Routine general medical examination at a health care facility 07/03/2013  . Tingling in extremities 02/27/2013  . CKD (chronic kidney disease) stage 3, GFR 30-59 ml/min (HCC) 11/20/2012  . Immunization due 11/20/2012  . Osteoarthritis 07/24/2012  . Essential hypertension, benign 07/24/2012  . GERD (gastroesophageal reflux disease) 07/24/2012  . Unspecified vitamin D deficiency 07/24/2012  . Mixed hyperlipidemia 07/24/2012  . RENAL CALCULUS 06/28/2007  . FIBROIDS, UTERUS 12/29/2006  . ENDOMETRIOSIS, SITE UNSPECIFIED 12/29/2006    Patient's Medications  New Prescriptions   No medications on file  Previous Medications   ALENDRONATE (FOSAMAX) 70 MG TABLET    Take 1 tablet (70 mg total) by mouth every 7 (seven) days. Take with a full glass of water on an empty stomach.   AMLODIPINE (NORVASC) 5 MG TABLET    TAKE 1 TABLET BY MOUTH  DAILY   CALCIUM CARB-CHOLECALCIFEROL 600-800 MG-UNIT TABS    Take 1 tablet by mouth daily.   CLOBETASOL OINTMENT (TEMOVATE) 0.05 %    Apply topically 2 (two) times daily.   DESONIDE (DESOWEN) 0.05 % LOTION    Apply 1 application  topically daily.   GLECAPREVIR-PIBRENTASVIR (MAVYRET) 100-40 MG TABS    Take 3 tablets by mouth daily with breakfast.   LEVOTHYROXINE (SYNTHROID) 75 MCG TABLET    TAKE 1 TABLET BY MOUTH ONCE DAILY FOR THYROID   PANTOPRAZOLE (PROTONIX) 40 MG TABLET    TAKE 1 TABLET BY MOUTH  DAILY   PROTOPIC 0.1 % OINTMENT    Apply topically 2 (two) times daily.  Modified Medications   No medications on file  Discontinued Medications   No medications on file    Allergies: No Known Allergies  Past Medical History: Past Medical History:  Diagnosis Date  . Conjunctivitis unspecified   . Disorder of bone and cartilage, unspecified   . GERD (gastroesophageal reflux disease)   . Hepatic lesion   . History of gallstones   . Hyperlipidemia   . Hypertension   . Hypothyroidism   . Kidney stone   . Lacrimal disorder   . Myalgia and myositis, unspecified   . Osteoarthrosis, unspecified whether generalized or localized, unspecified site   . Osteopenia   . Osteoporosis   . Osteoporosis, unspecified   . Other and unspecified hyperlipidemia   . Other dysphagia   . Pain in joint, forearm   . Reflux esophagitis   . Unspecified disorder of kidney and ureter   . Unspecified essential hypertension   . Unspecified hypothyroidism   . Unspecified vitamin D deficiency   . Vitamin D deficiency   . Vitiligo   . Vitiligo     Social History: Social History   Socioeconomic History  .  Marital status: Married    Spouse name: Not on file  . Number of children: 4  . Years of education: Not on file  . Highest education level: Not on file  Occupational History  . Not on file  Tobacco Use  . Smoking status: Never Smoker  . Smokeless tobacco: Never Used  Vaping Use  . Vaping Use: Never used  Substance and Sexual Activity  . Alcohol use: Yes    Alcohol/week: 0.0 standard drinks    Comment: occasional drink wine   . Drug use: No  . Sexual activity: Never  Other Topics Concern  . Not on file  Social  History Narrative  . Not on file   Social Determinants of Health   Financial Resource Strain:   . Difficulty of Paying Living Expenses: Not on file  Food Insecurity:   . Worried About Charity fundraiser in the Last Year: Not on file  . Ran Out of Food in the Last Year: Not on file  Transportation Needs:   . Lack of Transportation (Medical): Not on file  . Lack of Transportation (Non-Medical): Not on file  Physical Activity:   . Days of Exercise per Week: Not on file  . Minutes of Exercise per Session: Not on file  Stress:   . Feeling of Stress : Not on file  Social Connections:   . Frequency of Communication with Friends and Family: Not on file  . Frequency of Social Gatherings with Friends and Family: Not on file  . Attends Religious Services: Not on file  . Active Member of Clubs or Organizations: Not on file  . Attends Archivist Meetings: Not on file  . Marital Status: Not on file    Labs: Hepatitis C Lab Results  Component Value Date   HCVGENOTYPE 1a 01/20/2020   HEPCAB REACTIVE (A) 01/07/2020   HCVRNAPCRQN 1,210,000 (H) 01/07/2020   FIBROSTAGE F2 01/20/2020   Hepatitis B Lab Results  Component Value Date   HEPBSAB REACTIVE (A) 01/20/2020   HEPBSAG NON-REACTIVE 01/20/2020   HEPBCAB NON-REACTIVE 01/20/2020   Hepatitis A Lab Results  Component Value Date   HAV REACTIVE (A) 01/20/2020   HIV Lab Results  Component Value Date   HIV NON-REACTIVE 01/20/2020   Lab Results  Component Value Date   CREATININE 0.99 (H) 01/07/2020   CREATININE 0.86 12/05/2018   CREATININE 1.01 (H) 04/12/2018   CREATININE 0.88 10/03/2017   CREATININE 0.88 07/04/2017   Lab Results  Component Value Date   AST 19 01/07/2020   AST 20 12/05/2018   AST 26 04/12/2018   ALT 20 01/20/2020   ALT 13 01/07/2020   ALT 12 12/05/2018   INR 1.0 01/20/2020    Assessment: Tina Roberts is here today for her 4-week Hepatitis C follow up appointment. She is doing very well on her  [place] and has not missed any doses since starting back on 11/1. She has received her 2nd and final month and started it last week. No issues receiving it from the pharmacy. She isn't having any side effects. She takes all three tablets together with food around 11am each day. Congratulated her on the perfect adherence and encouraged continued compliance throughout the rest of her 8 week treatment course.  She asked to have her BP checked and it was 156/90. She stated that she was going to call her PCP and get an appointment soon. I will check labs today and have her see Dr. Juleen China for her cure visit.  Plan: - Continue Mavyret x 8 weeks - Hep C RNA today - F/u with Dr. Juleen China 4/12 at [facility]. Eber Hong, PharmD, BCIDP, AAHIVP, CPP Clinical Pharmacist Practitioner Infectious Diseases [facility] 03/09/2020, 11:19 AM

## 2020-03-09 NOTE — Telephone Encounter (Signed)
Oh okay, please let her know that there are other options to treat her osteoporosis such as prolia which is an injection that she could get every 6 months. We can do a prior auth if she is interested.

## 2020-03-09 NOTE — Telephone Encounter (Signed)
Please call and clarify this.

## 2020-03-09 NOTE — Telephone Encounter (Signed)
Pt wanted me to let you know she stopped taking the vitamins for your bones that she discussed with you previously she said. [place]?

## 2020-03-09 NOTE — Telephone Encounter (Signed)
She said she did stop taking medication because it seemed to be making her acid reflux worse.

## 2020-03-10 NOTE — Telephone Encounter (Signed)
Spoke with patient and she stated that she would like to just keep taking vitamin at this time for osteoporosis and she is not interested in the shot at this time. She also had concerns about her BP. She saw [facility] Pharmacist yesterday for Hep C clinic and was told that her BP was high reading 157/98 and to contact her PCP.

## 2020-03-10 NOTE — Telephone Encounter (Signed)
Have her check blood pressure at home a few times in the next week. Make sure she is sitting (for at least 5 mins) and it has been at least an hour after she has taken her medication. Record the blood pressure and call us back for review. If they are still elevated over 140/90 we will likely want to see her in office. Low sodium diet recommended

## 2020-03-11 LAB — HEPATITIS C RNA QUANTITATIVE
HCV Quantitative Log: <1.18 log IU/mL
HCV RNA, PCR, QN: <15 IU/mL

## 2020-03-12 NOTE — Telephone Encounter (Signed)
Called patient and she explained she had already been given the information.

## 2020-07-10 ENCOUNTER — Ambulatory Visit (INDEPENDENT_AMBULATORY_CARE_PROVIDER_SITE_OTHER): Payer: Medicare Other | Admitting: Nurse Practitioner

## 2020-07-10 ENCOUNTER — Other Ambulatory Visit: Payer: Self-pay

## 2020-07-10 ENCOUNTER — Encounter: Payer: Self-pay | Admitting: Nurse Practitioner

## 2020-07-10 VITALS — BP 160/88 | HR 71 | Temp 96.8°F | Ht 62.0 in | Wt 120.0 lb

## 2020-07-10 DIAGNOSIS — M810 Age-related osteoporosis without current pathological fracture: Secondary | ICD-10-CM

## 2020-07-10 DIAGNOSIS — K219 Gastro-esophageal reflux disease without esophagitis: Secondary | ICD-10-CM | POA: Diagnosis not present

## 2020-07-10 DIAGNOSIS — I10 Essential (primary) hypertension: Secondary | ICD-10-CM | POA: Diagnosis not present

## 2020-07-10 DIAGNOSIS — E034 Atrophy of thyroid (acquired): Secondary | ICD-10-CM | POA: Diagnosis not present

## 2020-07-10 DIAGNOSIS — E782 Mixed hyperlipidemia: Secondary | ICD-10-CM

## 2020-07-10 DIAGNOSIS — N183 Chronic kidney disease, stage 3 unspecified: Secondary | ICD-10-CM

## 2020-07-10 DIAGNOSIS — B182 Chronic viral hepatitis C: Secondary | ICD-10-CM

## 2020-07-10 NOTE — Patient Instructions (Addendum)
Blood pressure should be less than 140/90  Avoid peppermint    [email or web address]">  DASH Eating Plan DASH stands for Dietary Approaches to Stop Hypertension. The DASH eating plan is a healthy eating plan that has been shown to:  Reduce high blood pressure (hypertension).  Reduce your risk for type 2 diabetes, heart disease, and stroke.  Help with weight loss. What are tips for following this plan? Reading food labels  Check food labels for the amount of salt (sodium) per serving. Choose foods with less than 5 percent of the Daily Value of sodium. Generally, foods with less than 300 milligrams (mg) of sodium per serving fit into this eating plan.  To find whole grains, look for the word "whole" as the first word in the ingredient list. Shopping  Buy products labeled as "low-sodium" or "no salt added."  Buy fresh foods. Avoid canned foods and pre-made or frozen meals. Cooking  Avoid adding salt when cooking. Use salt-free seasonings or herbs instead of table salt or sea salt. Check with your health care provider or pharmacist before using salt substitutes.  Do not fry foods. Cook foods using healthy methods such as baking, boiling, grilling, roasting, and broiling instead.  Cook with heart-healthy oils, such as olive, canola, avocado, soybean, or sunflower oil. Meal planning  Eat a balanced diet that includes: ? 4 or more servings of fruits and 4 or more servings of vegetables each day. Try to fill one-half of your plate with fruits and vegetables. ? 6-8 servings of whole grains each day. ? Less than 6 oz (170 g) of lean meat, poultry, or fish each day. A 3-oz (85-g) serving of meat is about the same size as a deck of cards. One egg equals 1 oz (28 g). ? 2-3 servings of low-fat dairy each day. One serving is 1 cup (237 mL). ? 1 serving of nuts, seeds, or beans 5 times each week. ? 2-3 servings of heart-healthy fats. Healthy fats  called omega-3 fatty acids are found in foods such as walnuts, flaxseeds, fortified milks, and eggs. These fats are also found in cold-water fish, such as sardines, salmon, and mackerel.  Limit how much you eat of: ? Canned or prepackaged foods. ? Food that is high in trans fat, such as some fried foods. ? Food that is high in saturated fat, such as fatty meat. ? Desserts and other sweets, sugary drinks, and other foods with added sugar. ? Full-fat dairy products.  Do not salt foods before eating.  Do not eat more than 4 egg yolks a week.  Try to eat at least 2 vegetarian meals a week.  Eat more home-cooked food and less restaurant, buffet, and fast food.   Lifestyle  When eating at a restaurant, ask that your food be prepared with less salt or no salt, if possible.  If you drink alcohol: ? Limit how much you use to:  0-1 drink a day for women who are not pregnant.  0-2 drinks a day for men. ? Be aware of how much alcohol is in your drink. In the [place] one drink equals one 12 oz bottle of beer (355 mL), one 5 oz glass of wine (148 mL), or one 1 oz glass of hard liquor (44 mL). General information  Avoid eating more than 2,300 mg of salt a day. If you have hypertension, you may need to reduce your sodium intake to 1,500 mg a day.  Work with your health care provider to maintain a healthy  body weight or to lose weight. Ask what an ideal weight is for you.  Get at least 30 minutes of exercise that causes your heart to beat faster (aerobic exercise) most days of the week. Activities may include walking, swimming, or biking.  Work with your health care provider or dietitian to adjust your eating plan to your individual calorie needs. What foods should I eat? Fruits All fresh, dried, or frozen fruit. Canned fruit in natural juice (without added sugar). Vegetables Fresh or frozen vegetables (raw, steamed, roasted, or grilled). Low-sodium or reduced-sodium tomato and vegetable juice.  Low-sodium or reduced-sodium tomato sauce and tomato paste. Low-sodium or reduced-sodium canned vegetables. Grains Whole-grain or whole-wheat bread. Whole-grain or whole-wheat pasta. Brown rice. [place]. Bulgur. Whole-grain and low-sodium cereals. Pita bread. Low-fat, low-sodium crackers. Whole-wheat flour tortillas. Meats and other proteins Skinless chicken or [place]. Ground chicken or [place]. Pork with fat trimmed off. Fish and seafood. Egg whites. Dried beans, peas, or lentils. Unsalted nuts, nut butters, and seeds. Unsalted canned beans. Lean cuts of beef with fat trimmed off. Low-sodium, lean precooked or cured meat, such as sausages or meat loaves. Dairy Low-fat (1%) or fat-free (skim) milk. Reduced-fat, low-fat, or fat-free cheeses. Nonfat, low-sodium ricotta or cottage cheese. Low-fat or nonfat yogurt. Low-fat, low-sodium cheese. Fats and oils Soft margarine without trans fats. Vegetable oil. Reduced-fat, low-fat, or light mayonnaise and salad dressings (reduced-sodium). Canola, safflower, olive, avocado, soybean, and sunflower oils. Avocado. Seasonings and condiments Herbs. Spices. Seasoning mixes without salt. Other foods Unsalted popcorn and pretzels. Fat-free sweets. The items listed above may not be a complete list of foods and beverages you can eat. Contact a dietitian for more information. What foods should I avoid? Fruits Canned fruit in a light or heavy syrup. Fried fruit. Fruit in cream or butter sauce. Vegetables Creamed or fried vegetables. Vegetables in a cheese sauce. Regular canned vegetables (not low-sodium or reduced-sodium). Regular canned tomato sauce and paste (not low-sodium or reduced-sodium). Regular tomato and vegetable juice (not low-sodium or reduced-sodium). Angie Fava. Olives. Grains Baked goods made with fat, such as croissants, muffins, or some breads. Dry pasta or rice meal packs. Meats and other proteins Fatty cuts of meat. Ribs. Fried meat. Berniece Salines.  Bologna, salami, and other precooked or cured meats, such as sausages or meat loaves. Fat from the back of a pig (fatback). Bratwurst. Salted nuts and seeds. Canned beans with added salt. Canned or smoked fish. Whole eggs or egg yolks. Chicken or [place] with skin. Dairy Whole or 2% milk, cream, and half-and-half. Whole or full-fat cream cheese. Whole-fat or sweetened yogurt. Full-fat cheese. Nondairy creamers. Whipped toppings. Processed cheese and cheese spreads. Fats and oils Butter. Stick margarine. Lard. Shortening. Ghee. Bacon fat. Tropical oils, such as coconut, palm kernel, or palm oil. Seasonings and condiments Onion salt, garlic salt, seasoned salt, table salt, and sea salt. Worcestershire sauce. Tartar sauce. Barbecue sauce. Teriyaki sauce. Soy sauce, including reduced-sodium. Steak sauce. Canned and packaged gravies. Fish sauce. Oyster sauce. Cocktail sauce. Store-bought horseradish. Ketchup. Mustard. Meat flavorings and tenderizers. Bouillon cubes. Hot sauces. Pre-made or packaged marinades. Pre-made or packaged taco seasonings. Relishes. Regular salad dressings. Other foods Salted popcorn and pretzels. The items listed above may not be a complete list of foods and beverages you should avoid. Contact a dietitian for more information. Where to find more information  National Heart, Lung, and Blood Institute: https://wilson-eaton.com/  American Heart Association: www.heart.org  Academy of Nutrition and Dietetics: www.eatright.[place]: www.kidney.org Summary  The  DASH eating plan is a healthy eating plan that has been shown to reduce high blood pressure (hypertension). It may also reduce your risk for type 2 diabetes, heart disease, and stroke.  When on the DASH eating plan, aim to eat more fresh fruits and vegetables, whole grains, lean proteins, low-fat dairy, and heart-healthy fats.  With the DASH eating plan, you should limit salt (sodium) intake to 2,300 mg a  day. If you have hypertension, you may need to reduce your sodium intake to 1,500 mg a day.  Work with your health care provider or dietitian to adjust your eating plan to your individual calorie needs. This information is not intended to replace advice given to you by your health care provider. Make sure you discuss any questions you have with your health care provider. Document Revised: 02/22/2019 Document Reviewed: 02/22/2019 Elsevier Patient Education  2021 Elsevier .    Gastroesophageal Reflux Disease, Adult  Gastroesophageal reflux (GER) happens when acid from the stomach flows up into the tube that connects the mouth and the stomach (esophagus). Normally, food travels down the esophagus and stays in the stomach to be digested. With GER, food and stomach acid sometimes move back up into the esophagus. You may have a disease called gastroesophageal reflux disease (GERD) if the reflux:  Happens often.  Causes frequent or very bad symptoms.  Causes problems such as damage to the esophagus. When this happens, the esophagus becomes sore and swollen. Over time, GERD can make small holes (ulcers) in the lining of the esophagus. What are the causes? This condition is caused by a problem with the muscle between the esophagus and the stomach. When this muscle is weak or not normal, it does not close properly to keep food and acid from coming back up from the stomach. The muscle can be weak because of:  Tobacco use.  Pregnancy.  Having a certain type of hernia (hiatal hernia).  Alcohol use.  Certain foods and drinks, such as coffee, chocolate, onions, and peppermint. What increases the risk?  Being overweight.  Having a disease that affects your connective tissue.  Taking NSAIDs, such a ibuprofen. What are the signs or symptoms?  Heartburn.  Difficult or painful swallowing.  The feeling of having a lump in the throat.  A bitter taste in the mouth.  Bad breath.  Having  a lot of saliva.  Having an upset or bloated stomach.  Burping.  Chest pain. Different conditions can cause chest pain. Make sure you see your doctor if you have chest pain.  Shortness of breath or wheezing.  A long-term cough or a cough at night.  Wearing away of the surface of teeth (tooth enamel).  Weight loss. How is this treated?  Making changes to your diet.  Taking medicine.  Having surgery. Treatment will depend on how bad your symptoms are. Follow these instructions at home: Eating and drinking  Follow a diet as told by your doctor. You may need to avoid foods and drinks such as: ? Coffee and tea, with or without caffeine. ? Drinks that contain alcohol. ? Energy drinks and sports drinks. ? Bubbly (carbonated) drinks or sodas. ? Chocolate and cocoa. ? Peppermint and mint flavorings. ? Garlic and onions. ? Horseradish. ? Spicy and acidic foods. These include peppers, chili powder, curry powder, vinegar, hot sauces, and BBQ sauce. ? Citrus fruit juices and citrus fruits, such as oranges, lemons, and limes. ? Tomato-based foods. These include red sauce, chili, salsa, and pizza with red  sauce. ? Fried and fatty foods. These include donuts, french fries, potato chips, and high-fat dressings. ? High-fat meats. These include hot dogs, rib eye steak, sausage, ham, and bacon. ? High-fat dairy items, such as whole milk, butter, and cream cheese.  Eat small meals often. Avoid eating large meals.  Avoid drinking large amounts of liquid with your meals.  Avoid eating meals during the 2-3 hours before bedtime.  Avoid lying down right after you eat.  Do not exercise right after you eat.   Lifestyle  Do not smoke or use any products that contain nicotine or tobacco. If you need help quitting, ask your doctor.  Try to lower your stress. If you need help doing this, ask your doctor.  If you are overweight, lose an amount of weight that is healthy for you. Ask your  doctor about a safe weight loss goal.   General instructions  Pay attention to any changes in your symptoms.  Take over-the-counter and prescription medicines only as told by your doctor.  Do not take aspirin, ibuprofen, or other NSAIDs unless your doctor says it is okay.  Wear loose clothes. Do not wear anything tight around your waist.  Raise (elevate) the head of your bed about 6 inches (15 cm). You may need to use a wedge to do this.  Avoid bending over if this makes your symptoms worse.  Keep all follow-up visits. Contact a doctor if:  You have new symptoms.  You lose weight and you do not know why.  You have trouble swallowing or it hurts to swallow.  You have wheezing or a cough that keeps happening.  You have a hoarse voice.  Your symptoms do not get better with treatment. Get help right away if:  You have sudden pain in your arms, neck, jaw, teeth, or back.  You suddenly feel sweaty, dizzy, or light-headed.  You have chest pain or shortness of breath.  You vomit and the vomit is green, yellow, or black, or it looks like blood or coffee grounds.  You faint.  Your poop (stool) is red, bloody, or black.  You cannot swallow, drink, or eat. These symptoms may represent a serious problem that is an emergency. Do not wait to see if the symptoms will go away. Get medical help right away. Call your local emergency services (911 in the [place] Do not drive yourself to the hospital. Summary  If a person has gastroesophageal reflux disease (GERD), food and stomach acid move back up into the esophagus and cause symptoms or problems such as damage to the esophagus.  Treatment will depend on how bad your symptoms are.  Follow a diet as told by your doctor.  Take all medicines only as told by your doctor. This information is not intended to replace advice given to you by your health care provider. Make sure you discuss any questions you have with your health care  provider. Document Revised: 09/30/2019 Document Reviewed: 09/30/2019 Elsevier Patient Education  2021 [place] for Gastroesophageal Reflux Disease, Adult When you have gastroesophageal reflux disease (GERD), the foods you eat and your eating habits are very important. Choosing the right foods can help ease your discomfort. Think about working with a food expert (dietitian) to help you make good choices. What are tips for following this plan? Reading food labels  Look for foods that are low in saturated fat. Foods that may help with your symptoms include: ? Foods that have less than 5%  of daily value (DV) of fat. ? Foods that have 0 grams of trans fat. Cooking  Do not fry your food.  Cook your food by baking, steaming, grilling, or broiling. These are all methods that do not need a lot of fat for cooking.  To add flavor, try to use herbs that are low in spice and acidity. Meal planning  Choose healthy foods that are low in fat, such as: ? Fruits and vegetables. ? Whole grains. ? Low-fat dairy products. ? Lean meats, fish, and poultry.  Eat small meals often instead of eating 3 large meals each day. Eat your meals slowly in a place where you are relaxed. Avoid bending over or lying down until 2-3 hours after eating.  Limit high-fat foods such as fatty meats or fried foods.  Limit your intake of fatty foods, such as oils, butter, and shortening.  Avoid the following as told by your doctor: ? Foods that cause symptoms. These may be different for different people. Keep a food diary to keep track of foods that cause symptoms. ? Alcohol. ? Drinking a lot of liquid with meals. ? Eating meals during the 2-3 hours before bed.   Lifestyle  Stay at a healthy weight. Ask your doctor what weight is healthy for you. If you need to lose weight, work with your doctor to do so safely.  Exercise for at least 30 minutes on 5 or more days each week, or as told by your  doctor.  Wear loose-fitting clothes.  Do not smoke or use any products that contain nicotine or tobacco. If you need help quitting, ask your doctor.  Sleep with the head of your bed higher than your feet. Use a wedge under the mattress or blocks under the bed frame to raise the head of the bed.  Chew sugar-free gum after meals. What foods should eat? Eat a healthy, well-balanced diet of fruits, vegetables, whole grains, low-fat dairy products, lean meats, fish, and poultry. Each person is different. Foods that may cause symptoms in one person may not cause any symptoms in another person. Work with your doctor to find foods that are safe for you. The items listed above may not be a complete list of what you can eat and drink. Contact a food expert for more options.   What foods should I avoid? Limiting some of these foods may help in managing the symptoms of GERD. Everyone is different. Talk with a food expert or your doctor to help you find the exact foods to avoid, if any. Fruits Any fruits prepared with added fat. Any fruits that cause symptoms. For some people, this may include citrus fruits, such as oranges, grapefruit, pineapple, and lemons. Vegetables Deep-fried vegetables. [place] fries. Any vegetables prepared with added fat. Any vegetables that cause symptoms. For some people, this may include tomatoes and tomato products, chili peppers, onions and garlic, and horseradish. Grains Pastries or quick breads with added fat. Meats and other proteins High-fat meats, such as fatty beef or pork, hot dogs, ribs, ham, sausage, salami, and bacon. Fried meat or protein, including fried fish and fried chicken. Nuts and nut butters, in large amounts. Dairy Whole milk and chocolate milk. Sour cream. Cream. Ice cream. Cream cheese. Milkshakes. Fats and oils Butter. Margarine. Shortening. Ghee. Beverages Coffee and tea, with or without caffeine. Carbonated beverages. Sodas. Energy drinks. Fruit  juice made with acidic fruits, such as orange or grapefruit. Tomato juice. Alcoholic drinks. Sweets and desserts Chocolate and cocoa. Donuts. Seasonings  and condiments Pepper. Peppermint and spearmint. Added salt. Any condiments, herbs, or seasonings that cause symptoms. For some people, this may include curry, hot sauce, or vinegar-based salad dressings. The items listed above may not be a complete list of what you should not eat and drink. Contact a food expert for more options. Questions to ask your doctor Diet and lifestyle changes are often the first steps that are taken to manage symptoms of GERD. If diet and lifestyle changes do not help, talk with your doctor about taking medicines. Where to find more information  International Foundation for Gastrointestinal Disorders: aboutgerd.org Summary  When you have GERD, food and lifestyle choices are very important in easing your symptoms.  Eat small meals often instead of 3 large meals a day. Eat your meals slowly and in a place where you are relaxed.  Avoid bending over or lying down until 2-3 hours after eating.  Limit high-fat foods such as fatty meats or fried foods. This information is not intended to replace advice given to you by your health care provider. Make sure you discuss any questions you have with your health care provider. Document Revised: 09/30/2019 Document Reviewed: 09/30/2019 Elsevier Patient Education  [place].

## 2020-07-10 NOTE — Progress Notes (Signed)
Careteam: Patient Care Team: Lauree Chandler, NP as PCP - General (Geriatric Medicine)  PLACE OF SERVICE:  [place] Directive information Does Patient Have a Medical Advance Directive?: Yes, Type of Advance Directive: [place];Living will, Does patient want to make changes to medical advance directive?: No - Patient declined  No Known Allergies  Chief Complaint  Patient presents with  . Medical Management of Chronic Issues    6 month follow-up and fasting labs. Patient d/c'ed fosamax. Per patient covid vaccine booster up to date, not in [place]. Patient would like b/p parameters in writing on her check out papers (what is consider too high and what is considered too low)      HPI: Patient is a 80 y.o. female here for follow up.  GERD- Reports worsening acid reflux recently at night. Currently on protonix. Does take fosamax for her osteoporosis. Reports she eats a lot of peppermint candies throughout he day for her dry mouth.   OP- takes vit d twice daily and fosamax, enjoys walking around neighborhood three days a week.  HTN- BP normally runs 130-140's/ 80-90's at home. Does not take BP at the same time every day. After recheck BP elevated at 158/90. Currently on norvasc 5 mg daily  Hypothyroidism- continue synthroid 75 mg daily  Review of Systems:  Review of Systems  Constitutional: Negative for chills, fever and weight loss.  HENT: Negative for tinnitus.   Respiratory: Negative for cough, sputum production and shortness of breath.   Cardiovascular: Negative for chest pain, palpitations and leg swelling.  Gastrointestinal: Negative for abdominal pain, constipation, heartburn, nausea and vomiting.  Genitourinary: Negative for dysuria, frequency and urgency.  Musculoskeletal: Negative for back pain, falls and myalgias.  Skin: Negative.   Neurological: Negative for dizziness, weakness and headaches.  Psychiatric/Behavioral: Negative for  depression and memory loss. The patient does not have insomnia.     Past Medical History:  Diagnosis Date  . Conjunctivitis unspecified   . Disorder of bone and cartilage, unspecified   . GERD (gastroesophageal reflux disease)   . Hepatic lesion   . History of gallstones   . Hyperlipidemia   . Hypertension   . Hypothyroidism   . Kidney stone   . Lacrimal disorder   . Myalgia and myositis, unspecified   . Osteoarthrosis, unspecified whether generalized or localized, unspecified site   . Osteopenia   . Osteoporosis   . Osteoporosis, unspecified   . Other and unspecified hyperlipidemia   . Other dysphagia   . Pain in joint, forearm   . Reflux esophagitis   . Unspecified disorder of kidney and ureter   . Unspecified essential hypertension   . Unspecified hypothyroidism   . Unspecified vitamin D deficiency   . Vitamin D deficiency   . Vitiligo   . Vitiligo    Past Surgical History:  Procedure Laterality Date  . COLONOSCOPY    . LEG SURGERY     right  . TUBAL LIGATION  1973   Social History:   reports that she has never smoked. She has never used smokeless tobacco. She reports current alcohol use. She reports that she does not use drugs.  Family History  Problem Relation Age of Onset  . Colon cancer Mother 73  . Diabetes Mother   . Diabetes Sister   . Esophageal cancer Neg Hx   . Rectal cancer Neg Hx   . Stomach cancer Neg Hx     Medications: Patient's Medications  New Prescriptions   No medications on file  Previous Medications   AMLODIPINE (NORVASC) 5 MG TABLET    TAKE 1 TABLET BY MOUTH  DAILY   CALCIUM CARB-CHOLECALCIFEROL 600-800 MG-UNIT TABS    Take 1 tablet by mouth daily.   CLOBETASOL OINTMENT (TEMOVATE) 0.05 %    Apply 1 application topically as needed.   DESONIDE (DESOWEN) 0.05 % LOTION    Apply 1 application topically as needed.   GLECAPREVIR-PIBRENTASVIR 100-40 MG TABS    TAKE 3 TABLETS BY MOUTH DAILY WITH BREAKFAST.   LEVOTHYROXINE (SYNTHROID) 75  MCG TABLET    TAKE 1 TABLET BY MOUTH ONCE DAILY FOR THYROID   PANTOPRAZOLE (PROTONIX) 40 MG TABLET    TAKE 1 TABLET BY MOUTH  DAILY   PROTOPIC 0.1 % OINTMENT    Apply 1 application topically as needed.  Modified Medications   No medications on file  Discontinued Medications   ALENDRONATE (FOSAMAX) 70 MG TABLET    Take 1 tablet (70 mg total) by mouth every 7 (seven) days. Take with a full glass of water on an empty stomach.    Physical Exam:  Vitals:   07/10/20 0949  BP: (!) 160/88  Pulse: 71  Temp: (!) 96.8 F (36 C)  TempSrc: Temporal  SpO2: 95%  Weight: 120 lb (54.4 kg)  Height: 5\' 2"  (1.575 m)   Body mass index is 21.95 kg/m. Wt Readings from Last 3 Encounters:  07/10/20 120 lb (54.4 kg)  01/20/20 119 lb (54 kg)  01/10/20 116 lb (52.6 kg)    Physical Exam Constitutional:      General: She is not in acute distress.    Appearance: Normal appearance. She is not diaphoretic.  HENT:     Head: Normocephalic and atraumatic.     Mouth/Throat:     Pharynx: No oropharyngeal exudate.  Eyes:     Conjunctiva/sclera: Conjunctivae normal.     Pupils: Pupils are equal, round, and reactive to light.  Cardiovascular:     Rate and Rhythm: Normal rate and regular rhythm.     Heart sounds: Normal heart sounds.  Pulmonary:     Effort: Pulmonary effort is normal.     Breath sounds: Normal breath sounds.  Abdominal:     General: Bowel sounds are normal.     Palpations: Abdomen is soft.  Musculoskeletal:        General: No swelling or tenderness. Normal range of motion.  Skin:    General: Skin is warm and dry.  Neurological:     Mental Status: She is alert and oriented to person, place, and time.    Labs reviewed: Basic Metabolic Panel: Recent Labs    01/07/20 0908  NA 140  K 4.1  CL 105  CO2 27  GLUCOSE 99  BUN 22  CREATININE 0.99*  CALCIUM 9.6  TSH 2.84   Liver Function Tests: Recent Labs    01/07/20 0908 01/20/20 1026  AST 19  --   ALT 13 20  BILITOT 0.5   --   PROT 7.2  --    No results for input(s): LIPASE, AMYLASE in the last 8760 hours. No results for input(s): AMMONIA in the last 8760 hours. CBC: Recent Labs    01/07/20 0908  WBC 5.5  NEUTROABS 2,552  HGB 13.9  HCT 42.8  MCV 87.0  PLT 221   Lipid Panel: Recent Labs    01/07/20 0908  CHOL 196  HDL 71  LDLCALC 110*  TRIG 60  CHOLHDL  2.8   TSH: Recent Labs    01/07/20 0908  TSH 2.84   A1C: No results found for: HGBA1C   Assessment/Plan 1. Essential hypertension, benign Elevated today but reports normal ranges at home. Educated on taking bp at the same time everyday. Will bring it log next visit. Continue taking norvasc 5 mg daily. Encouraged to decrease sodium in diet.  2. Gastroesophageal reflux disease, unspecified whether esophagitis present Worse at night, will finish out current bottle of fosamax and then stop to see if this is contributing to acid reflux. Continue taking protonix daily. Educated on avoidance of trigger foods such as caffeine, soda, fried foods, peppermint, and alcohol. Plans to also make lifestyle and dietary modifications  3. Hypothyroidism due to acquired atrophy of thyroid Stable at this time, continue taking synthroid 75 mg daily.  4. Age-related osteoporosis without current pathological fracture Continue taking vitamin D twice daily. Will d/c fosamax due to worsening GERD symptoms. Educated on alternate treatments for osteoporoses  but not interested at this time.   5. Stage 3 chronic kidney disease, unspecified whether stage 3a or 3b CKD ([place]) Encouraged to increase water intake and avoid NSAIDS.   6. Mixed hyperlipidemia Controlled with dietary modifications and exercise.  7. Hepatitis C Followed by ID, reports she has completed treatment at this time  Next appt: 6 months  I personally was present during the history, physical exam and medical decision-making activities of this service and have verified that the service and  findings are accurately documented in the student's note  Khayri Kargbo K. [place], [facility] [phone]

## 2020-07-14 ENCOUNTER — Telehealth: Payer: Self-pay

## 2020-07-14 ENCOUNTER — Other Ambulatory Visit: Payer: Self-pay

## 2020-07-14 ENCOUNTER — Ambulatory Visit (INDEPENDENT_AMBULATORY_CARE_PROVIDER_SITE_OTHER): Payer: Medicare Other | Admitting: Internal Medicine

## 2020-07-14 ENCOUNTER — Encounter: Payer: Medicare Other | Admitting: Nurse Practitioner

## 2020-07-14 ENCOUNTER — Encounter: Payer: Self-pay | Admitting: Internal Medicine

## 2020-07-14 DIAGNOSIS — B182 Chronic viral hepatitis C: Secondary | ICD-10-CM

## 2020-07-14 NOTE — Telephone Encounter (Signed)
Attempted to call patient for annual wellness visit 3 times. Phone just kept ringing the first two attempts. The 3rd and final attempt a gentleman answered the phone, stated patient wasn't there and hung up. I did not get a chance to explain who I was and where I was calling from.  1st-9:00 am 2nd- 9:05 am 3rd and final attempt-9:44

## 2020-07-14 NOTE — Progress Notes (Signed)
[facility]  CHIEF COMPLAINT:    Follow up for HCV  SUBJECTIVE:    Tina Roberts is a 80 y.o. female with PMHx as below who presents to the clinic for HCV follow up.  She completed her treatment course of [place] x8 weeks more than 12 weeks ago.  Her mid treatment follow up visit was done in December and her HCV RNA was undetectable at that point.  She completed treatment without any issues.     Please see A&P for the details of today's visit and status of the patient's medical problems.   Patient's Medications  New Prescriptions   No medications on file  Previous Medications   AMLODIPINE (NORVASC) 5 MG TABLET    TAKE 1 TABLET BY MOUTH  DAILY   CALCIUM CARB-CHOLECALCIFEROL 600-800 MG-UNIT TABS    Take 1 tablet by mouth daily.   CLOBETASOL OINTMENT (TEMOVATE) 0.05 %    Apply 1 application topically as needed.   DESONIDE (DESOWEN) 0.05 % LOTION    Apply 1 application topically as needed.   GLECAPREVIR-PIBRENTASVIR 100-40 MG TABS    TAKE 3 TABLETS BY MOUTH DAILY WITH BREAKFAST.   LEVOTHYROXINE (SYNTHROID) 75 MCG TABLET    TAKE 1 TABLET BY MOUTH ONCE DAILY FOR THYROID   PANTOPRAZOLE (PROTONIX) 40 MG TABLET    TAKE 1 TABLET BY MOUTH  DAILY   PROTOPIC 0.1 % OINTMENT    Apply 1 application topically as needed.  Modified Medications   No medications on file  Discontinued Medications   No medications on file      Past Medical History:  Diagnosis Date  . Conjunctivitis unspecified   . Disorder of bone and cartilage, unspecified   . GERD (gastroesophageal reflux disease)   . Hepatic lesion   . History of gallstones   . Hyperlipidemia   . Hypertension   . Hypothyroidism   . Kidney stone   . Lacrimal disorder   . Myalgia and myositis, unspecified   . Osteoarthrosis, unspecified whether generalized or localized, unspecified site   . Osteopenia   . Osteoporosis   . Osteoporosis, unspecified   . Other and unspecified hyperlipidemia   . Other  dysphagia   . Pain in joint, forearm   . Reflux esophagitis   . Unspecified disorder of kidney and ureter   . Unspecified essential hypertension   . Unspecified hypothyroidism   . Unspecified vitamin D deficiency   . Vitamin D deficiency   . Vitiligo   . Vitiligo     Social History   Tobacco Use  . Smoking status: Never Smoker  . Smokeless tobacco: Never Used  Vaping Use  . Vaping Use: Never used  Substance Use Topics  . Alcohol use: Yes    Alcohol/week: 0.0 standard drinks    Comment: occasional drink wine   . Drug use: No    Family History  Problem Relation Age of Onset  . Colon cancer Mother 4  . Diabetes Mother   . Diabetes Sister   . Esophageal cancer Neg Hx   . Rectal cancer Neg Hx   . Stomach cancer Neg Hx     No Known Allergies  Review of Systems  Constitutional: Negative.   Respiratory: Negative.   Cardiovascular: Negative.   Gastrointestinal: Negative.      OBJECTIVE:    Vitals:   07/14/20 1103  BP: (!) 142/84  Pulse: 88  Temp: 98.2 F (36.8 C)  TempSrc: Oral  SpO2: 99%  Weight: 120 lb (54.4 kg)  Height: 5\' 2"  (1.575 m)   Body mass index is 21.95 kg/m.  Physical Exam Constitutional:      General: She is not in acute distress.    Appearance: Normal appearance.  Pulmonary:     Effort: Pulmonary effort is normal. No respiratory distress.  Neurological:     General: No focal deficit present.     Mental Status: She is alert and oriented to person, place, and time.  Psychiatric:        Mood and Affect: Mood normal.        Behavior: Behavior normal.      Labs and Microbiology: CBC Latest Ref Rng & Units 01/07/2020 12/05/2018 04/12/2018  WBC 3.8 - 10.8 Thousand/uL 5.5 4.6 4.8  Hemoglobin 11.7 - 15.5 g/dL 13.9 13.7 13.2  Hematocrit 35.0 - 45.0 % 42.8 42.6 40.9  Platelets 140 - 400 Thousand/uL 221 208 219   CMP Latest Ref Rng & Units 01/20/2020 01/07/2020 12/05/2018  Glucose 65 - 99 mg/dL - 99 97  BUN 7 - 25 mg/dL - 22 15  Creatinine  0.60 - 0.93 mg/dL - 0.99(H) 0.86  Sodium 135 - 146 mmol/L - 140 140  Potassium 3.5 - 5.3 mmol/L - 4.1 3.8  Chloride 98 - 110 mmol/L - 105 103  CO2 20 - 32 mmol/L - 27 28  Calcium 8.6 - 10.4 mg/dL - 9.6 9.8  Total Protein 6.1 - 8.1 g/dL - 7.2 7.4  Total Bilirubin 0.2 - 1.2 mg/dL - 0.5 0.3  Alkaline Phos 33 - 130 U/L - - -  AST 10 - 35 U/L - 19 20  ALT 6 - 29 U/L 20 13 12      No results found for this or any previous visit (from the past 240 hour(s)).    ASSESSMENT & PLAN:    Chronic hepatitis C without hepatic coma ([place]) She presents today for her HCV cure visit greater than 12 weeks after completing [place] for HCV.  Will repeat HCV RNA today to ensure SVR and have her follow up as needed.   Orders Placed This Encounter  Procedures  . Hepatitis C RNA quantitative       [facility] 07/14/2020, 11:22 AM

## 2020-07-14 NOTE — Assessment & Plan Note (Signed)
She presents today for her HCV cure visit greater than 12 weeks after completing [place] for HCV.  Will repeat HCV RNA today to ensure SVR and have her follow up as needed.

## 2020-07-14 NOTE — Patient Instructions (Signed)
Thank you for coming to see me today. It was a pleasure seeing you.  To Do: [facility] Labs today to ensure your hepatitis C is cured. . I will let you know about the lab results later this week. . Follow up with me as needed.  If you have any questions or concerns, please do not hesitate to call the office at [phone].  Take Care,   Jule Ser, DO

## 2020-07-15 ENCOUNTER — Encounter: Payer: Self-pay | Admitting: Nurse Practitioner

## 2020-07-15 ENCOUNTER — Ambulatory Visit (INDEPENDENT_AMBULATORY_CARE_PROVIDER_SITE_OTHER): Payer: Medicare Other | Admitting: Nurse Practitioner

## 2020-07-15 ENCOUNTER — Telehealth: Payer: Self-pay

## 2020-07-15 DIAGNOSIS — Z0000 Encounter for general adult medical examination without abnormal findings: Secondary | ICD-10-CM

## 2020-07-15 NOTE — Progress Notes (Signed)
This service is provided via telemedicine  No vital signs collected/recorded due to the encounter was a telemedicine visit.   Location of patient (ex: home, work):  Home  Patient consents to a telephone visit:  Yes see telephone note 07/15/2020  Location of the provider (ex: office, home): [facility]  Name of any referring provider: N/A  Names of all persons participating in the telemedicine service and their role in the encounter: Marisa Cyphers RMA, Sherrie Mustache NP, Patient   Time spent on call:  10 min

## 2020-07-15 NOTE — Patient Instructions (Signed)
Tina Roberts , Thank you for taking time to come for your Medicare Wellness Visit. I appreciate your ongoing commitment to your health goals. Please review the following plan we discussed and let me know if I can assist you in the future.   Screening recommendations/referrals: Colonoscopy aged out Mammogram up to date Bone Density up to date Recommended yearly ophthalmology/optometry visit for glaucoma screening and checkup Recommended yearly dental visit for hygiene and checkup  Vaccinations: Influenza vaccine up to date Pneumococcal vaccine up to date Tdap vaccine RECOMMENDED_ to get at your local pharmacy Shingles vaccine RECOMMENDED- to get at your local pharmacy    Advanced directives: on file.   Conditions/risks identified: advance aged, memory loss  Next appointment: 1 year   Preventive Care 57 Years and Older, Female Preventive care refers to lifestyle choices and visits with your health care provider that can promote health and wellness. What does preventive care include?  A yearly physical exam. This is also called an annual well check.  Dental exams once or twice a year.  Routine eye exams. Ask your health care provider how often you should have your eyes checked.  Personal lifestyle choices, including:  Daily care of your teeth and gums.  Regular physical activity.  Eating a healthy diet.  Avoiding tobacco and drug use.  Limiting alcohol use.  Practicing safe sex.  Taking low-dose aspirin every day.  Taking vitamin and mineral supplements as recommended by your health care provider. What happens during an annual well check? The services and screenings done by your health care provider during your annual well check will depend on your age, overall health, lifestyle risk factors, and family history of disease. Counseling  Your health care provider may ask you questions about your:  Alcohol use.  Tobacco use.  Drug use.  Emotional well-being.  Home  and relationship well-being.  Sexual activity.  Eating habits.  History of falls.  Memory and ability to understand (cognition).  Work and work Statistician.  Reproductive health. Screening  You may have the following tests or measurements:  Height, weight, and BMI.  Blood pressure.  Lipid and cholesterol levels. These may be checked every 5 years, or more frequently if you are over 67 years old.  Skin check.  Lung cancer screening. You may have this screening every year starting at age 3 if you have a 30-pack-year history of smoking and currently smoke or have quit within the past 15 years.  Fecal occult blood test (FOBT) of the stool. You may have this test every year starting at age 47.  Flexible sigmoidoscopy or colonoscopy. You may have a sigmoidoscopy every 5 years or a colonoscopy every 10 years starting at age 36.  Hepatitis C blood test.  Hepatitis B blood test.  Sexually transmitted disease (STD) testing.  Diabetes screening. This is done by checking your blood sugar (glucose) after you have not eaten for a while (fasting). You may have this done every 1-3 years.  Bone density scan. This is done to screen for osteoporosis. You may have this done starting at age 32.  Mammogram. This may be done every 1-2 years. Talk to your health care provider about how often you should have regular mammograms. Talk with your health care provider about your test results, treatment options, and if necessary, the need for more tests. Vaccines  Your health care provider may recommend certain vaccines, such as:  Influenza vaccine. This is recommended every year.  Tetanus, diphtheria, and acellular pertussis (Tdap, Td)  vaccine. You may need a Td booster every 10 years.  Zoster vaccine. You may need this after age 74.  Pneumococcal 13-valent conjugate (PCV13) vaccine. One dose is recommended after age 27.  Pneumococcal polysaccharide (PPSV23) vaccine. One dose is recommended  after age 14. Talk to your health care provider about which screenings and vaccines you need and how often you need them. This information is not intended to replace advice given to you by your health care provider. Make sure you discuss any questions you have with your health care provider. Document Released: 04/17/2015 Document Revised: 12/09/2015 Document Reviewed: 01/20/2015 Elsevier Interactive Patient Education  2017 [place] Prevention in the Home Falls can cause injuries. They can happen to people of all ages. There are many things you can do to make your home safe and to help prevent falls. What can I do on the outside of my home?  Regularly fix the edges of walkways and driveways and fix any cracks.  Remove anything that might make you trip as you walk through a door, such as a raised step or threshold.  Trim any bushes or trees on the path to your home.  Use bright outdoor lighting.  Clear any walking paths of anything that might make someone trip, such as rocks or tools.  Regularly check to see if handrails are loose or broken. Make sure that both sides of any steps have handrails.  Any raised decks and porches should have guardrails on the edges.  Have any leaves, snow, or ice cleared regularly.  Use sand or salt on walking paths during winter.  Clean up any spills in your garage right away. This includes oil or grease spills. What can I do in the bathroom?  Use night lights.  Install grab bars by the toilet and in the tub and shower. Do not use towel bars as grab bars.  Use non-skid mats or decals in the tub or shower.  If you need to sit down in the shower, use a plastic, non-slip stool.  Keep the floor dry. Clean up any water that spills on the floor as soon as it happens.  Remove soap buildup in the tub or shower regularly.  Attach bath mats securely with double-sided non-slip rug tape.  Do not have throw rugs and other things on the floor  that can make you trip. What can I do in the bedroom?  Use night lights.  Make sure that you have a light by your bed that is easy to reach.  Do not use any sheets or blankets that are too big for your bed. They should not hang down onto the floor.  Have a firm chair that has side arms. You can use this for support while you get dressed.  Do not have throw rugs and other things on the floor that can make you trip. What can I do in the kitchen?  Clean up any spills right away.  Avoid walking on wet floors.  Keep items that you use a lot in easy-to-reach places.  If you need to reach something above you, use a strong step stool that has a grab bar.  Keep electrical cords out of the way.  Do not use floor polish or wax that makes floors slippery. If you must use wax, use non-skid floor wax.  Do not have throw rugs and other things on the floor that can make you trip. What can I do with my stairs?  Do not leave  any items on the stairs.  Make sure that there are handrails on both sides of the stairs and use them. Fix handrails that are broken or loose. Make sure that handrails are as long as the stairways.  Check any carpeting to make sure that it is firmly attached to the stairs. Fix any carpet that is loose or worn.  Avoid having throw rugs at the top or bottom of the stairs. If you do have throw rugs, attach them to the floor with carpet tape.  Make sure that you have a light switch at the top of the stairs and the bottom of the stairs. If you do not have them, ask someone to add them for you. What else can I do to help prevent falls?  Wear shoes that:  Do not have high heels.  Have rubber bottoms.  Are comfortable and fit you well.  Are closed at the toe. Do not wear sandals.  If you use a stepladder:  Make sure that it is fully opened. Do not climb a closed stepladder.  Make sure that both sides of the stepladder are locked into place.  Ask someone to hold it  for you, if possible.  Clearly mark and make sure that you can see:  Any grab bars or handrails.  First and last steps.  Where the edge of each step is.  Use tools that help you move around (mobility aids) if they are needed. These include:  Canes.  Walkers.  Scooters.  Crutches.  Turn on the lights when you go into a dark area. Replace any light bulbs as soon as they burn out.  Set up your furniture so you have a clear path. Avoid moving your furniture around.  If any of your floors are uneven, fix them.  If there are any pets around you, be aware of where they are.  Review your medicines with your doctor. Some medicines can make you feel dizzy. This can increase your chance of falling. Ask your doctor what other things that you can do to help prevent falls. This information is not intended to replace advice given to you by your health care provider. Make sure you discuss any questions you have with your health care provider. Document Released: 01/15/2009 Document Revised: 08/27/2015 Document Reviewed: 04/25/2014 Elsevier Interactive Patient Education  2017 [employer].

## 2020-07-15 NOTE — Telephone Encounter (Signed)
[name], lanting are scheduled for a virtual visit with your provider today.    Just as we do with appointments in the office, we must obtain your consent to participate.  Your consent will be active for this visit and any virtual visit you may have with one of our providers in the next 365 days.    If you have a MyChart account, I can also send a copy of this consent to you electronically.  All virtual visits are billed to your insurance company just like a traditional visit in the office.  As this is a virtual visit, video technology does not allow for your provider to perform a traditional examination.  This may limit your provider's ability to fully assess your condition.  If your provider identifies any concerns that need to be evaluated in person or the need to arrange testing such as labs, EKG, etc, we will make arrangements to do so.    Although advances in technology are sophisticated, we cannot ensure that it will always work on either your end or our end.  If the connection with a video visit is poor, we may have to switch to a telephone visit.  With either a video or telephone visit, we are not always able to ensure that we have a secure connection.   I need to obtain your verbal consent now.   Are you willing to proceed with your visit today?   Tina Roberts has provided verbal consent on 07/15/2020 for a virtual visit (video or telephone).   [name], [place] 07/15/2020  12:26 PM

## 2020-07-15 NOTE — Progress Notes (Signed)
Subjective:   Tina Roberts is a 80 y.o. female who presents for Medicare Annual (Subsequent) preventive examination.  Review of Systems     Cardiac Risk Factors include: dyslipidemia;hypertension;advanced age (>45men, >65 women)     Objective:    There were no vitals filed for this visit. There is no height or weight on file to calculate BMI.  Advanced Directives 07/15/2020 07/10/2020 01/10/2020 12/05/2018 09/05/2018 04/12/2018 12/01/2017  Does Patient Have a Medical Advance Directive? Yes Yes Yes Yes Yes Yes Yes  Type of Paramedic of [place];Living will [place];Living will [place];Living will Healthcare Power of [place];Living will [place]  Does patient want to make changes to medical advance directive? No - Patient declined No - Patient declined No - Patient declined No - Patient declined No - Patient declined No - Patient declined No - Patient declined  Copy of [place] in Chart? Yes - validated most recent copy scanned in chart (See row information) Yes - validated most recent copy scanned in chart (See row information) Yes - validated most recent copy scanned in chart (See row information) Yes - validated most recent copy scanned in chart (See row information) Yes - validated most recent copy scanned in chart (See row information) Yes - validated most recent copy scanned in chart (See row information) Yes  Would patient like information on creating a medical advance directive? - - - - - - -    Current Medications (verified) Outpatient Encounter Medications as of 07/15/2020  Medication Sig  . amLODipine (NORVASC) 5 MG tablet TAKE 1 TABLET BY MOUTH  DAILY  . Calcium Carb-Cholecalciferol 600-800 MG-UNIT TABS Take 1 tablet by mouth daily.  . clobetasol ointment (TEMOVATE) 7.41 % Apply 1 application topically as needed.   . desonide (DESOWEN) 0.05 % lotion Apply 1 application topically as needed.  Marland Kitchen levothyroxine (SYNTHROID) 75 MCG tablet TAKE 1 TABLET BY MOUTH ONCE DAILY FOR THYROID  . pantoprazole (PROTONIX) 40 MG tablet TAKE 1 TABLET BY MOUTH  DAILY  . PROTOPIC 0.1 % ointment Apply 1 application topically as needed.  . [DISCONTINUED] Glecaprevir-Pibrentasvir 100-40 MG TABS TAKE 3 TABLETS BY MOUTH DAILY WITH BREAKFAST.   No facility-administered encounter medications on file as of 07/15/2020.    Allergies (verified) Patient has no known allergies.   History: Past Medical History:  Diagnosis Date  . Conjunctivitis unspecified   . Disorder of bone and cartilage, unspecified   . GERD (gastroesophageal reflux disease)   . Hepatic lesion   . History of gallstones   . Hyperlipidemia   . Hypertension   . Hypothyroidism   . Kidney stone   . Lacrimal disorder   . Myalgia and myositis, unspecified   . Osteoarthrosis, unspecified whether generalized or localized, unspecified site   . Osteopenia   . Osteoporosis   . Osteoporosis, unspecified   . Other and unspecified hyperlipidemia   . Other dysphagia   . Pain in joint, forearm   . Reflux esophagitis   . Unspecified disorder of kidney and ureter   . Unspecified essential hypertension   . Unspecified hypothyroidism   . Unspecified vitamin D deficiency   . Vitamin D deficiency   . Vitiligo   . Vitiligo    Past Surgical History:  Procedure Laterality Date  . COLONOSCOPY    . LEG SURGERY     right  . [place]  Family History  Problem Relation Age of Onset  . Colon cancer Mother 54  . Diabetes Mother   . Diabetes Sister   . Esophageal cancer Neg Hx   . Rectal cancer Neg Hx   . Stomach cancer Neg Hx    Social History   Socioeconomic History  . Marital status: Married    Spouse name: Not on file  . Number of children: 4  . Years of education: Not on file  . Highest education level: Not on file  Occupational History  .  Not on file  Tobacco Use  . Smoking status: Never Smoker  . Smokeless tobacco: Never Used  Vaping Use  . Vaping Use: Never used  Substance and Sexual Activity  . Alcohol use: Yes    Alcohol/week: 0.0 standard drinks    Comment: occasional drink wine   . Drug use: No  . Sexual activity: Not Currently  Other Topics Concern  . Not on file  Social History Narrative  . Not on file   Social Determinants of Health   Financial Resource Strain: Not on file  Food Insecurity: Not on file  Transportation Needs: Not on file  Physical Activity: Not on file  Stress: Not on file  Social Connections: Not on file    Tobacco Counseling Counseling given: Not Answered   Clinical Intake:  Pre-visit preparation completed: Yes  Pain : No/denies pain     BMI - recorded: 21.95 Nutritional Status: BMI of 19-24  Normal Nutritional Risks: None Diabetes: No  How often do you need to have someone help you when you read instructions, pamphlets, or other written materials from your doctor or pharmacy?: 3 - Sometimes  Diabetic?no         Activities of Daily Living In your present state of health, do you have any difficulty performing the following activities: 07/15/2020  Hearing? Y  Vision? N  Difficulty concentrating or making decisions? N  Walking or climbing stairs? N  Dressing or bathing? N  Doing errands, shopping? N  Preparing Food and eating ? N  Using the Toilet? N  In the past six months, have you accidently leaked urine? Y  Do you have problems with loss of bowel control? N  Managing your Medications? N  Managing your Finances? N  Housekeeping or managing your Housekeeping? N  Some recent data might be hidden    Patient Care Team: Lauree Chandler, NP as PCP - General (Geriatric Medicine)  Indicate any recent Medical Services you may have received from other than [facility] providers in the past year (date may be approximate).     Assessment:   This is a routine  wellness examination for Tina Roberts.  Hearing/Vision screen No exam data present  Dietary issues and exercise activities discussed: Current Exercise Habits: Home exercise routine, Type of exercise: walking, Time (Minutes): 60, Frequency (Times/Week): 3, Weekly Exercise (Minutes/Week): 180  Goals    . Exercise 3x per week (30 min per time)     Starting 03/16/16, I will maintain my current exercise routine of 3 days per week.     . Maintain Lifestyle     Pt will maintain lifestyle.      Depression Screen PHQ 2/9 Scores 07/14/2020 01/20/2020 01/10/2020 04/08/2019 12/05/2018 09/05/2018 12/01/2017  PHQ - 2 Score 0 0 0 0 0 0 0    Fall Risk Fall Risk  07/15/2020 07/14/2020 01/20/2020 01/10/2020 08/07/2019  Falls in the past year? 1 1 0 0 0  Number falls in  past yr: 0 0 - 0 0  Injury with Fall? 0 0 - 0 0  Risk for fall due to : - History of fall(s) No Fall Risks - -  Follow up - Falls evaluation completed Falls evaluation completed - -    FALL RISK PREVENTION PERTAINING TO THE HOME:  Any stairs in or around the home? No   Home free of loose throw rugs in walkways, pet beds, electrical cords, etc? Yes  Adequate lighting in your home to reduce risk of falls? Yes   ASSISTIVE DEVICES UTILIZED TO PREVENT FALLS:  Life alert? No  Use of a cane, walker or w/c? No  Grab bars in the bathroom? No  Shower chair or bench in shower? No  Elevated toilet seat or a handicapped toilet? No   TIMED UP AND GO:  Was the test performed? No .    Cognitive Function: MMSE - Mini Mental State Exam 12/05/2018 12/01/2017 11/25/2016 03/16/2016 01/09/2015  Orientation to time 5 5 5 5 5   Orientation to Place 5 5 5 5 5   Registration 3 3 3 3 3   Attention/ Calculation 3 0 0 0 0  Recall 2 3 1 3 3   Language- name 2 objects 2 2 2 2 2   Language- repeat 1 1 1 1 1   Language- follow 3 step command 2 3 3 3 3   Language- follow 3 step command-comments Folded paper x 2 - - - -  Language- read & follow direction 0 1 1 1 1    Language-read & follow direction-comments read sentence, did not close eyes - - - -  Write a sentence 1 1 1 1 1   Copy design 0 0 0 0 0  Total score 24 24 22 24 24      6CIT Screen 07/15/2020  What Year? 0 points  What month? 0 points  What time? 0 points  Count back from 20 4 points  Months in reverse 4 points  Repeat phrase 0 points  Total Score 8    Immunizations Immunization History  Administered Date(s) Administered  . Fluad Quad(high Dose 65+) 12/05/2018, 01/10/2020  . Influenza Whole 12/03/2012  . Influenza, High Dose Seasonal PF 11/25/2016  . Influenza,inj,Quad PF,6+ Mos 01/29/2014, 01/09/2015, 01/03/2018  . Influenza-Unspecified 01/20/2016  . Moderna Sars-Covid-2 Vaccination 06/14/2019, 07/15/2019  . Pneumococcal Conjugate-13 01/29/2014  . Pneumococcal Polysaccharide-23 11/20/2012    TDAP status: Due, Education has been provided regarding the importance of this vaccine. Advised may receive this vaccine at local pharmacy or Health Dept. Aware to provide a copy of the vaccination record if obtained from local pharmacy or Health Dept. Verbalized acceptance and understanding.  Flu Vaccine status: Up to date  Pneumococcal vaccine status: Up to date  Covid-19 vaccine status: Completed vaccines  Qualifies for Shingles Vaccine? Yes   Zostavax completed No   Shingrix Completed?: No.    Education has been provided regarding the importance of this vaccine. Patient has been advised to call insurance company to determine out of pocket expense if they have not yet received this vaccine. Advised may also receive vaccine at local pharmacy or Health Dept. Verbalized acceptance and understanding.  Screening Tests Health Maintenance  Topic Date Due  . COVID-19 Vaccine (3 - Booster for Moderna series) 01/14/2020  . TETANUS/TDAP  10/04/2023 (Originally 11/21/1959)  . INFLUENZA VACCINE  11/02/2020  . DEXA SCAN  Completed  . Hepatitis C Screening  Completed  . PNA vac Low Risk Adult   Completed  . HPV VACCINES  Aged  Out    Health Maintenance  Health Maintenance Due  Topic Date Due  . COVID-19 Vaccine (3 - Booster for Moderna series) 01/14/2020    Colorectal cancer screening: No longer required.   Mammogram status: No longer required due to age.  Bone Density status: Completed 2021. Results reflect: Bone density results: OSTEOPOROSIS. Repeat every 2 years.  Lung Cancer Screening: (Low Dose CT Chest recommended if Age 28-80 years, 30 pack-year currently smoking OR have quit w/in 15years.) does not qualify.    Additional Screening:  Hepatitis C Screening: does qualify; Completed   Vision Screening: Recommended annual ophthalmology exams for early detection of glaucoma and other disorders of the eye. Is the patient up to date with their annual eye exam?  Yes  Who is the provider or what is the name of the office in which the patient attends annual eye exams? [facility] If pt is not established with a provider, would they like to be referred to a provider to establish care? No .   Dental Screening: Recommended annual dental exams for proper oral hygiene  Community Resource Referral / Chronic Care Management: CRR required this visit?  No   CCM required this visit?  No      Plan:     I have personally reviewed and noted the following in the patient's chart:   . Medical and social history . Use of alcohol, tobacco or illicit drugs  . Current medications and supplements . Functional ability and status . Nutritional status . Physical activity . Advanced directives . List of other physicians . Hospitalizations, surgeries, and ER visits in previous 12 months . Vitals . Screenings to include cognitive, depression, and falls . Referrals and appointments  In addition, I have reviewed and discussed with patient certain preventive protocols, quality metrics, and best practice recommendations. A written personalized care plan for preventive services as well as  general preventive health recommendations were provided to patient.     Lauree Chandler, NP   07/15/2020    Virtual Visit via Telephone Note  I connected with@ on 07/15/20 at  1:00 PM EDT by telephone and verified that I am speaking with the correct person using two identifiers.  Location: Patient: home Provider: [place]   I discussed the limitations, risks, security and privacy concerns of performing an evaluation and management service by telephone and the availability of in person appointments. I also discussed with the patient that there may be a patient responsible charge related to this service. The patient expressed understanding and agreed to proceed.   I discussed the assessment and treatment plan with the patient. The patient was provided an opportunity to ask questions and all were answered. The patient agreed with the plan and demonstrated an understanding of the instructions.   The patient was advised to call back or seek an in-person evaluation if the symptoms worsen or if the condition fails to improve as anticipated.  I provided 18 minutes of non-face-to-face time during this encounter.  Carlos American. Harle Battiest Avs printed and mailed

## 2020-07-17 LAB — HEPATITIS C RNA QUANTITATIVE
HCV Quantitative Log: <1.18 log IU/mL
HCV RNA, PCR, QN: <15 IU/mL

## 2020-07-20 ENCOUNTER — Telehealth: Payer: Self-pay

## 2020-07-20 NOTE — Telephone Encounter (Signed)
-----   Message from Mignon Pine, DO sent at 07/20/2020  4:17 PM EDT ----- Please let pt know that HCV RNA is still negative indicating HCV cure.  She can follow up with [place] PRN.  Thanks, Mitzi Hansen

## 2020-07-20 NOTE — Telephone Encounter (Signed)
RN attempted to call patient to relay results, no answer and unable to leave message.   Beryle Flock, RN

## 2020-07-23 NOTE — Progress Notes (Signed)
This encounter was created in error - please disregard.

## 2020-07-24 ENCOUNTER — Other Ambulatory Visit: Payer: Self-pay | Admitting: Nurse Practitioner

## 2020-07-24 DIAGNOSIS — K219 Gastro-esophageal reflux disease without esophagitis: Secondary | ICD-10-CM

## 2020-07-24 NOTE — Telephone Encounter (Signed)
No answer when called patient's listed phone numbers. Mobile phone belongs to "Glendell Docker", emergency contact is a workstation at [employer] - no messages left.  Landis Gandy, RN

## 2020-07-28 NOTE — Telephone Encounter (Signed)
Have been unable to reach patient on mobile number. RN called home phone number, patient's son answered. RN asked that he please have the patient call her doctor's office, no private health information was relayed or discussed.   Beryle Flock, RN

## 2020-10-07 ENCOUNTER — Other Ambulatory Visit: Payer: Self-pay | Admitting: Nurse Practitioner

## 2020-10-07 DIAGNOSIS — Z1231 Encounter for screening mammogram for malignant neoplasm of breast: Secondary | ICD-10-CM

## 2020-11-30 ENCOUNTER — Other Ambulatory Visit: Payer: Self-pay

## 2020-11-30 ENCOUNTER — Ambulatory Visit
Admission: RE | Admit: 2020-11-30 | Discharge: 2020-11-30 | Disposition: A | Discharge status: Home / Self Care | Payer: Medicare Other | Source: Ambulatory Visit | Attending: Nurse Practitioner | Admitting: Nurse Practitioner

## 2020-11-30 DIAGNOSIS — Z1231 Encounter for screening mammogram for malignant neoplasm of breast: Secondary | ICD-10-CM

## 2021-01-04 ENCOUNTER — Ambulatory Visit (INDEPENDENT_AMBULATORY_CARE_PROVIDER_SITE_OTHER): Payer: Medicare Other | Admitting: Nurse Practitioner

## 2021-01-04 ENCOUNTER — Encounter: Payer: Self-pay | Admitting: Nurse Practitioner

## 2021-01-04 ENCOUNTER — Other Ambulatory Visit: Payer: Medicare Other

## 2021-01-04 ENCOUNTER — Other Ambulatory Visit: Payer: Self-pay

## 2021-01-04 VITALS — BP 162/88 | HR 91 | Temp 96.9°F | Ht 62.0 in | Wt 117.0 lb

## 2021-01-04 DIAGNOSIS — E782 Mixed hyperlipidemia: Secondary | ICD-10-CM

## 2021-01-04 DIAGNOSIS — I10 Essential (primary) hypertension: Secondary | ICD-10-CM

## 2021-01-04 DIAGNOSIS — Z23 Encounter for immunization: Secondary | ICD-10-CM | POA: Diagnosis not present

## 2021-01-04 DIAGNOSIS — K219 Gastro-esophageal reflux disease without esophagitis: Secondary | ICD-10-CM | POA: Diagnosis not present

## 2021-01-04 MED ORDER — AMLODIPINE BESYLATE 10 MG PO TABS: 10.0000 mg | ORAL_TABLET | Freq: Every day | ORAL | 1 refills | Status: DC

## 2021-01-04 MED ORDER — DEXLANSOPRAZOLE 30 MG PO CPDR: 30.0000 mg | DELAYED_RELEASE_CAPSULE | Freq: Every day | ORAL | 1 refills | Status: DC

## 2021-01-04 NOTE — Patient Instructions (Addendum)
To get COVID booster at local pharmacy   Stop protonix. Start dexilant 30 mg daily.   Take blood pressure medication before next visit.  Increase norvasc to 10 mg (can take 2 of the 5 mg tablets to equal 10) by mouth daily Take bp at home and bring back to office at follow up for review.

## 2021-01-04 NOTE — Progress Notes (Signed)
Careteam: Patient Care Team: Lauree Chandler, NP as PCP - General (Geriatric Medicine)  PLACE OF SERVICE:  [place] Directive information Does Patient Have a Medical Advance Directive?: Yes, Type of Advance Directive: [place], Does patient want to make changes to medical advance directive?: No - Patient declined  No Known Allergies  Chief Complaint  Patient presents with   Medical Management of Chronic Issues    6 month follow-up. Health Maintenance reviewed - discuss need for shingrix, covid #3, and flu vaccine or exclude if patient refuses.       HPI: Patient is a 80 y.o. female for routine follow up   Hypothyroid- on synthroid 75 mcg  GERD- continues on protonix 40 mg daily. Was having worsening GERD at last visit. She was on fosamax due to OP but she stopped this. Also discussed dietary modifications. Also taking tums. Still having frequent symptoms at night and in the morning.   Htn- on amlodipine 5 mg - elevated at last OV, discussed dietary modifications.   Osteoporosis- stopped fosamax due to side effects, did not want alternate treatment. Continues on Vit d and cal   Eating healthy due to her cholesterol Walking 3 days a week for 1 hour.    Review of Systems:  Review of Systems  Constitutional:  Negative for chills, fever and weight loss.  HENT:  Negative for tinnitus.   Respiratory:  Negative for cough, sputum production and shortness of breath.   Cardiovascular:  Negative for chest pain, palpitations and leg swelling.  Gastrointestinal:  Negative for abdominal pain, constipation, diarrhea and heartburn.  Genitourinary:  Negative for dysuria, frequency and urgency.  Musculoskeletal:  Negative for back pain, falls, joint pain and myalgias.  Skin: Negative.   Neurological:  Negative for dizziness and headaches.  Psychiatric/Behavioral:  Negative for depression and memory loss. The patient does not have insomnia.    Past  Medical History:  Diagnosis Date   Conjunctivitis unspecified    Disorder of bone and cartilage, unspecified    GERD (gastroesophageal reflux disease)    Hepatic lesion    History of gallstones    Hyperlipidemia    Hypertension    Hypothyroidism    Kidney stone    Lacrimal disorder    Myalgia and myositis, unspecified    Osteoarthrosis, unspecified whether generalized or localized, unspecified site    Osteopenia    Osteoporosis    Osteoporosis, unspecified    Other and unspecified hyperlipidemia    Other dysphagia    Pain in joint, forearm    Reflux esophagitis    Unspecified disorder of kidney and ureter    Unspecified essential hypertension    Unspecified hypothyroidism    Unspecified vitamin D deficiency    Vitamin D deficiency    Vitiligo    Vitiligo    Past Surgical History:  Procedure Laterality Date   COLONOSCOPY     LEG SURGERY     right   TUBAL LIGATION  1973   Social History:   reports that she has never smoked. She has never used smokeless tobacco. She reports current alcohol use. She reports that she does not use drugs.  Family History  Problem Relation Age of Onset   Colon cancer Mother 64   Diabetes Mother    Diabetes Sister    Esophageal cancer Neg Hx    Rectal cancer Neg Hx    Stomach cancer Neg Hx     Medications: Patient's Medications  New Prescriptions   No medications on file  Previous Medications   AMLODIPINE (NORVASC) 5 MG TABLET    TAKE 1 TABLET BY MOUTH  DAILY   CALCIUM CARB-CHOLECALCIFEROL 600-800 MG-UNIT TABS    Take 1 tablet by mouth daily.   CLOBETASOL OINTMENT (TEMOVATE) 0.05 %    Apply 1 application topically as needed.   DESONIDE (DESOWEN) 0.05 % LOTION    Apply 1 application topically as needed.   LEVOTHYROXINE (SYNTHROID) 75 MCG TABLET    TAKE 1 TABLET BY MOUTH ONCE DAILY FOR THYROID   PANTOPRAZOLE (PROTONIX) 40 MG TABLET    TAKE 1 TABLET BY MOUTH  DAILY   PROTOPIC 0.1 % OINTMENT    Apply 1 application topically as needed.   Modified Medications   No medications on file  Discontinued Medications   No medications on file    Physical Exam:  Vitals:   01/04/21 0835  BP: (!) 162/88  Pulse: 91  Temp: (!) 96.9 F (36.1 C)  TempSrc: Temporal  SpO2: 99%  Weight: 117 lb (53.1 kg)  Height: '5\' 2"'  (1.575 m)   Body mass index is 21.4 kg/m. Wt Readings from Last 3 Encounters:  01/04/21 117 lb (53.1 kg)  07/14/20 120 lb (54.4 kg)  07/10/20 120 lb (54.4 kg)    Physical Exam Constitutional:      General: She is not in acute distress.    Appearance: She is well-developed. She is not diaphoretic.  HENT:     Head: Normocephalic and atraumatic.     Mouth/Throat:     Pharynx: No oropharyngeal exudate.  Eyes:     Conjunctiva/sclera: Conjunctivae normal.     Pupils: Pupils are equal, round, and reactive to light.  Cardiovascular:     Rate and Rhythm: Normal rate and regular rhythm.     Heart sounds: Normal heart sounds.  Pulmonary:     Effort: Pulmonary effort is normal.     Breath sounds: Normal breath sounds.  Abdominal:     General: Bowel sounds are normal.     Palpations: Abdomen is soft.  Musculoskeletal:     Cervical back: Normal range of motion and neck supple.     Right lower leg: No edema.     Left lower leg: No edema.  Skin:    General: Skin is warm and dry.  Neurological:     Mental Status: She is alert.  Psychiatric:        Mood and Affect: Mood normal.    Labs reviewed: Basic Metabolic Panel: Recent Labs    01/07/20 0908  NA 140  K 4.1  CL 105  CO2 27  GLUCOSE 99  BUN 22  CREATININE 0.99*  CALCIUM 9.6  TSH 2.84   Liver Function Tests: Recent Labs    01/07/20 0908 01/20/20 1026  AST 19  --   ALT 13 20  BILITOT 0.5  --   PROT 7.2  --    No results for input(s): LIPASE, AMYLASE in the last 8760 hours. No results for input(s): AMMONIA in the last 8760 hours. CBC: Recent Labs    01/07/20 0908  WBC 5.5  NEUTROABS 2,552  HGB 13.9  HCT 42.8  MCV 87.0  PLT 221    Lipid Panel: Recent Labs    01/07/20 0908  CHOL 196  HDL 71  LDLCALC 110*  TRIG 60  CHOLHDL 2.8   TSH: Recent Labs    01/07/20 0908  TSH 2.84   A1C: No results found for:  HGBA1C   Assessment/Plan 1. Need for influenza vaccination - Flu Vaccine QUAD High Dose(Fluad)  2. Essential hypertension, benign Home blood pressure readings high per pt report. Will increase noravsc to 10 mg daily at this time and have her follow up in 2 weeks. Continue lifestyle modifications. - amLODipine (NORVASC) 10 MG tablet; Take 1 tablet (10 mg total) by mouth daily.  Dispense: 30 tablet; Refill: 1 - CMP with eGFR([facility]) - CBC with Differential/Platelet  3. Gastroesophageal reflux disease, unspecified whether esophagitis present Ongoing symptoms. Taking tums PRN. Will have her stop protonix and start dexlansoprazole 30 mg daily. Continue dietary modification and lifestyle modifications.  - Dexlansoprazole 30 MG capsule; Take 1 capsule (30 mg total) by mouth daily.  Dispense: 90 capsule; Refill: 1  4. Mixed hyperlipidemia -continues on diet modifications.  - Lipid panel - CMP with eGFR([facility])   Next appt: 2 weeks for BP check Jaber Dunlow K. [place], [facility] [phone]

## 2021-01-05 LAB — COMPLETE METABOLIC PANEL WITH GFR
AG Ratio: 1.7 (calc) (ref 1.0–2.5)
ALT: 7 U/L (ref 6–29)
AST: 15 U/L (ref 10–35)
Albumin: 4.5 g/dL (ref 3.6–5.1)
Alkaline phosphatase (APISO): 47 U/L (ref 37–153)
BUN/Creatinine Ratio: 25 (calc) — ABNORMAL HIGH (ref 6–22)
BUN: 26 mg/dL — ABNORMAL HIGH (ref 7–25)
CO2: 27 mmol/L (ref 20–32)
Calcium: 9.8 mg/dL (ref 8.6–10.4)
Chloride: 107 mmol/L (ref 98–110)
Creat: 1.02 mg/dL — ABNORMAL HIGH (ref 0.60–1.00)
Globulin: 2.7 g/dL (calc) (ref 1.9–3.7)
Glucose, Bld: 93 mg/dL (ref 65–99)
Potassium: 3.9 mmol/L (ref 3.5–5.3)
Sodium: 144 mmol/L (ref 135–146)
Total Bilirubin: 0.3 mg/dL (ref 0.2–1.2)
Total Protein: 7.2 g/dL (ref 6.1–8.1)
eGFR: 56 mL/min/{1.73_m2} — ABNORMAL LOW (ref >=60)

## 2021-01-05 LAB — CBC WITH DIFFERENTIAL/PLATELET
Absolute Monocytes: 464 cells/uL (ref 200–950)
Basophils Absolute: 32 cells/uL (ref 0–200)
Basophils Relative: 0.6 %
Eosinophils Absolute: 216 cells/uL (ref 15–500)
Eosinophils Relative: 4 %
HCT: 46.4 % — ABNORMAL HIGH (ref 35.0–45.0)
Hemoglobin: 14.8 g/dL (ref 11.7–15.5)
Lymphs Abs: 2284 cells/uL (ref 850–3900)
MCH: 27.7 pg (ref 27.0–33.0)
MCHC: 31.9 g/dL — ABNORMAL LOW (ref 32.0–36.0)
MCV: 86.9 fL (ref 80.0–100.0)
MPV: 10.5 fL (ref 7.5–12.5)
Monocytes Relative: 8.6 %
Neutro Abs: 2403 cells/uL (ref 1500–7800)
Neutrophils Relative %: 44.5 %
Platelets: 207 10*3/uL (ref 140–400)
RBC: 5.34 10*6/uL — ABNORMAL HIGH (ref 3.80–5.10)
RDW: 12.9 % (ref 11.0–15.0)
Total Lymphocyte: 42.3 %
WBC: 5.4 10*3/uL (ref 3.8–10.8)

## 2021-01-05 LAB — LIPID PANEL
Cholesterol: 230 mg/dL — ABNORMAL HIGH (ref <200)
HDL: 66 mg/dL (ref >=50)
LDL Cholesterol (Calc): 145 mg/dL (calc) — ABNORMAL HIGH
Non-HDL Cholesterol (Calc): 164 mg/dL (calc) — ABNORMAL HIGH (ref <130)
Total CHOL/HDL Ratio: 3.5 (calc) (ref <5.0)
Triglycerides: 88 mg/dL (ref <150)

## 2021-01-06 ENCOUNTER — Ambulatory Visit: Payer: Medicare Other | Admitting: Nurse Practitioner

## 2021-01-06 ENCOUNTER — Other Ambulatory Visit: Payer: Medicare Other

## 2021-01-07 ENCOUNTER — Other Ambulatory Visit: Payer: Self-pay

## 2021-01-07 DIAGNOSIS — E782 Mixed hyperlipidemia: Secondary | ICD-10-CM

## 2021-01-18 ENCOUNTER — Encounter: Payer: Self-pay | Admitting: Nurse Practitioner

## 2021-01-18 ENCOUNTER — Other Ambulatory Visit: Payer: Self-pay

## 2021-01-18 ENCOUNTER — Ambulatory Visit (INDEPENDENT_AMBULATORY_CARE_PROVIDER_SITE_OTHER): Payer: Medicare Other | Admitting: Nurse Practitioner

## 2021-01-18 ENCOUNTER — Other Ambulatory Visit: Payer: Self-pay | Admitting: Nurse Practitioner

## 2021-01-18 VITALS — BP 138/90 | HR 78 | Ht 62.0 in | Wt 118.0 lb

## 2021-01-18 DIAGNOSIS — I10 Essential (primary) hypertension: Secondary | ICD-10-CM

## 2021-01-18 DIAGNOSIS — K219 Gastro-esophageal reflux disease without esophagitis: Secondary | ICD-10-CM

## 2021-01-18 DIAGNOSIS — E782 Mixed hyperlipidemia: Secondary | ICD-10-CM | POA: Diagnosis not present

## 2021-01-18 NOTE — Telephone Encounter (Signed)
Pharmacy requested refill

## 2021-01-18 NOTE — Patient Instructions (Addendum)
Call mail order pharmacy about dexilant Once you get that stop PROTONIX.    Change lab appt to January the 6th so you can have your lab work done at time of appt

## 2021-01-18 NOTE — Progress Notes (Signed)
Careteam: Patient Care Team: Lauree Chandler, NP as PCP - General (Geriatric Medicine)  PLACE OF SERVICE:  [place] Directive information    No Known Allergies  Chief Complaint  Patient presents with   Follow-up    2 week follow-up on high blood pressure.      HPI: Patient is a 80 y.o. female to follow up elevated blood pressure.  Norvasc was increased to 10 mg at last visit.  Home blood pressure reading ranges from 120-142/70-80s No side effects noted from increasing medication No LE edema.  No headaches, blurred vision  She has not gotten the dexilant in the mail yet- was sent to her mail order pharmacy.   Cholesterol elevated- she was eating more fried food but has changed diet.   Review of Systems:  Review of Systems  Constitutional:  Negative for chills, fever and weight loss.  HENT:  Negative for tinnitus.   Respiratory:  Negative for cough, sputum production and shortness of breath.   Cardiovascular:  Negative for chest pain, palpitations and leg swelling.  Gastrointestinal:  Negative for abdominal pain, constipation, diarrhea and heartburn.  Genitourinary:  Negative for dysuria, frequency and urgency.  Musculoskeletal:  Negative for back pain, falls, joint pain and myalgias.  Skin: Negative.   Neurological:  Negative for dizziness and headaches.  Psychiatric/Behavioral:  Negative for depression and memory loss. The patient does not have insomnia.    Past Medical History:  Diagnosis Date   Conjunctivitis unspecified    Disorder of bone and cartilage, unspecified    GERD (gastroesophageal reflux disease)    Hepatic lesion    History of gallstones    Hyperlipidemia    Hypertension    Hypothyroidism    Kidney stone    Lacrimal disorder    Myalgia and myositis, unspecified    Osteoarthrosis, unspecified whether generalized or localized, unspecified site    Osteopenia    Osteoporosis    Osteoporosis, unspecified    Other and  unspecified hyperlipidemia    Other dysphagia    Pain in joint, forearm    Reflux esophagitis    Unspecified disorder of kidney and ureter    Unspecified essential hypertension    Unspecified hypothyroidism    Unspecified vitamin D deficiency    Vitamin D deficiency    Vitiligo    Vitiligo    Past Surgical History:  Procedure Laterality Date   COLONOSCOPY     LEG SURGERY     right   TUBAL LIGATION  1973   Social History:   reports that she has never smoked. She has never used smokeless tobacco. She reports current alcohol use. She reports that she does not use drugs.  Family History  Problem Relation Age of Onset   Colon cancer Mother 37   Diabetes Mother    Diabetes Sister    Esophageal cancer Neg Hx    Rectal cancer Neg Hx    Stomach cancer Neg Hx     Medications: Patient's Medications  New Prescriptions   No medications on file  Previous Medications   AMLODIPINE (NORVASC) 10 MG TABLET    Take 1 tablet (10 mg total) by mouth daily.   CALCIUM CARB-CHOLECALCIFEROL 600-800 MG-UNIT TABS    Take 1 tablet by mouth daily.   CLOBETASOL OINTMENT (TEMOVATE) 0.05 %    Apply 1 application topically as needed.   DESONIDE (DESOWEN) 0.05 % LOTION    Apply 1 application topically as needed.   DEXLANSOPRAZOLE 30  MG CAPSULE    Take 1 capsule (30 mg total) by mouth daily.   LEVOTHYROXINE (SYNTHROID) 75 MCG TABLET    TAKE 1 TABLET BY MOUTH ONCE DAILY FOR THYROID   PROTOPIC 0.1 % OINTMENT    Apply 1 application topically as needed.  Modified Medications   No medications on file  Discontinued Medications   No medications on file    Physical Exam:  Vitals:   01/18/21 0830  BP: 138/90  Pulse: 78  SpO2: 99%  Weight: 118 lb (53.5 kg)  Height: 5\' 2"  (1.575 m)   Body mass index is 21.4 kg/m. Wt Readings from Last 3 Encounters:  01/18/21 118 lb (53.5 kg)  01/04/21 117 lb (53.1 kg)  07/14/20 120 lb (54.4 kg)    Physical Exam Constitutional:      General: She is not in acute  distress.    Appearance: She is well-developed. She is not diaphoretic.  HENT:     Head: Normocephalic and atraumatic.     Mouth/Throat:     Pharynx: No oropharyngeal exudate.  Eyes:     Conjunctiva/sclera: Conjunctivae normal.     Pupils: Pupils are equal, round, and reactive to light.  Cardiovascular:     Rate and Rhythm: Normal rate and regular rhythm.     Heart sounds: Normal heart sounds.  Pulmonary:     Effort: Pulmonary effort is normal.     Breath sounds: Normal breath sounds.  Abdominal:     General: Bowel sounds are normal.     Palpations: Abdomen is soft.  Musculoskeletal:     Cervical back: Normal range of motion and neck supple.     Right lower leg: No edema.     Left lower leg: No edema.  Skin:    General: Skin is warm and dry.  Neurological:     Mental Status: She is alert.  Psychiatric:        Mood and Affect: Mood normal.    Labs reviewed: Basic Metabolic Panel: Recent Labs    01/04/21 0906  NA 144  K 3.9  CL 107  CO2 27  GLUCOSE 93  BUN 26*  CREATININE 1.02*  CALCIUM 9.8   Liver Function Tests: Recent Labs    01/20/20 1026 01/04/21 0906  AST  --  15  ALT 20 7  BILITOT  --  0.3  PROT  --  7.2   No results for input(s): LIPASE, AMYLASE in the last 8760 hours. No results for input(s): AMMONIA in the last 8760 hours. CBC: Recent Labs    01/04/21 0906  WBC 5.4  NEUTROABS 2,403  HGB 14.8  HCT 46.4*  MCV 86.9  PLT 207   Lipid Panel: Recent Labs    01/04/21 0906  CHOL 230*  HDL 66  LDLCALC 145*  TRIG 88  CHOLHDL 3.5   TSH: No results for input(s): TSH in the last 8760 hours. A1C: No results found for: HGBA1C   Assessment/Plan 1. Gastroesophageal reflux disease, unspecified whether esophagitis present Ongoing, awaiting dexalant from mail order pharmacy.   2. Essential hypertension, benign Blood pressure better controlled on increase dose of norvasc 10 mg daily. Continue to work on dietary modifications, low sodium diet.    3. Mixed hyperlipidemia Declines medications. Continues to work on diet.    Next appt: 3 months, labs prior to appt.  Carlos American. [place], [facility] [phone]

## 2021-04-12 ENCOUNTER — Other Ambulatory Visit: Payer: Medicare Other

## 2021-04-12 ENCOUNTER — Other Ambulatory Visit: Payer: Self-pay

## 2021-04-12 ENCOUNTER — Ambulatory Visit (INDEPENDENT_AMBULATORY_CARE_PROVIDER_SITE_OTHER): Payer: Medicare Other | Admitting: Nurse Practitioner

## 2021-04-12 ENCOUNTER — Encounter: Payer: Self-pay | Admitting: Nurse Practitioner

## 2021-04-12 VITALS — BP 124/78 | HR 76 | Temp 97.1°F | Ht 62.0 in | Wt 123.0 lb

## 2021-04-12 DIAGNOSIS — E034 Atrophy of thyroid (acquired): Secondary | ICD-10-CM | POA: Diagnosis not present

## 2021-04-12 DIAGNOSIS — I10 Essential (primary) hypertension: Secondary | ICD-10-CM

## 2021-04-12 DIAGNOSIS — K219 Gastro-esophageal reflux disease without esophagitis: Secondary | ICD-10-CM | POA: Diagnosis not present

## 2021-04-12 DIAGNOSIS — E782 Mixed hyperlipidemia: Secondary | ICD-10-CM

## 2021-04-12 DIAGNOSIS — M810 Age-related osteoporosis without current pathological fracture: Secondary | ICD-10-CM

## 2021-04-12 NOTE — Patient Instructions (Signed)
DUE FOR COVID booster- to get at local pharmacy

## 2021-04-12 NOTE — Progress Notes (Signed)
Careteam: Patient Care Team: Lauree Chandler, NP as PCP - General (Geriatric Medicine)  PLACE OF SERVICE:  [place] Directive information Does Patient Have a Medical Advance Directive?: Yes, Type of Advance Directive: [place], Does patient want to make changes to medical advance directive?: No - Patient declined  No Known Allergies  Chief Complaint  Patient presents with   Medical Management of Chronic Issues    3 month follow-up and fasting labs. Patient never picked up dexilant due to the cost. Patient would like to discuss calcium supplement. Discuss need for covid boosters. Patient states that the shingrix cost 158 dollars out of pocket and that is too much.      HPI: Patient is a 81 y.o. female routine follow up.   She continues protonix for GERD- dexilant was never sent due to cost. She still reports GERD is not controlled.   Htn- controlled on current medication and diet.   Hyperlipidemia- elevated on last labs. Has been working on diet to avoid medication. Reports myalgias when taking statin in the past.   Osteoporosis- does not want medication to treat- continues on vitamin  d and cal with weight bearing exercises.   Review of Systems:  Review of Systems  Constitutional:  Negative for chills, fever and weight loss.  HENT:  Negative for tinnitus.   Respiratory:  Negative for cough, sputum production and shortness of breath.   Cardiovascular:  Negative for chest pain, palpitations and leg swelling.  Gastrointestinal:  Negative for abdominal pain, constipation, diarrhea and heartburn.  Genitourinary:  Negative for dysuria, frequency and urgency.  Musculoskeletal:  Negative for back pain, falls, joint pain and myalgias.  Skin: Negative.   Neurological:  Negative for dizziness and headaches.  Psychiatric/Behavioral:  Negative for depression and memory loss. The patient does not have insomnia.    Past Medical History:  Diagnosis  Date   Conjunctivitis unspecified    Disorder of bone and cartilage, unspecified    GERD (gastroesophageal reflux disease)    Hepatic lesion    History of gallstones    Hyperlipidemia    Hypertension    Hypothyroidism    Kidney stone    Lacrimal disorder    Myalgia and myositis, unspecified    Osteoarthrosis, unspecified whether generalized or localized, unspecified site    Osteopenia    Osteoporosis    Osteoporosis, unspecified    Other and unspecified hyperlipidemia    Other dysphagia    Pain in joint, forearm    Reflux esophagitis    Unspecified disorder of kidney and ureter    Unspecified essential hypertension    Unspecified hypothyroidism    Unspecified vitamin D deficiency    Vitamin D deficiency    Vitiligo    Vitiligo    Past Surgical History:  Procedure Laterality Date   COLONOSCOPY     LEG SURGERY     right   TUBAL LIGATION  1973   Social History:   reports that she has never smoked. She has never used smokeless tobacco. She reports current alcohol use. She reports that she does not use drugs.  Family History  Problem Relation Age of Onset   Colon cancer Mother 5   Diabetes Mother    Diabetes Sister    Colon cancer Nephew    Esophageal cancer Neg Hx    Rectal cancer Neg Hx    Stomach cancer Neg Hx     Medications: Patient's Medications  New Prescriptions  No medications on file  Previous Medications   AMLODIPINE (NORVASC) 10 MG TABLET    TAKE 1 TABLET BY MOUTH EVERY DAY   CALCIUM CARB-CHOLECALCIFEROL 600-800 MG-UNIT TABS    Take 2 tablets by mouth daily.   CLOBETASOL OINTMENT (TEMOVATE) 0.05 %    Apply 1 application topically as needed.   DESONIDE (DESOWEN) 0.05 % LOTION    Apply 1 application topically as needed.   LEVOTHYROXINE (SYNTHROID) 75 MCG TABLET    TAKE 1 TABLET BY MOUTH ONCE DAILY FOR THYROID   OMEPRAZOLE (PRILOSEC) 40 MG CAPSULE    Take 40 mg by mouth daily.   PROTOPIC 0.1 % OINTMENT    Apply 1 application topically as needed.   Modified Medications   No medications on file  Discontinued Medications   DEXLANSOPRAZOLE 30 MG CAPSULE    Take 1 capsule (30 mg total) by mouth daily.    Physical Exam:  Vitals:   04/12/21 0802  BP: 124/78  Pulse: 76  Temp: (!) 97.1 F (36.2 C)  TempSrc: Temporal  SpO2: 99%  Weight: 123 lb (55.8 kg)  Height: '5\' 2"'  (1.575 m)   Body mass index is 22.5 kg/m. Wt Readings from Last 3 Encounters:  04/12/21 123 lb (55.8 kg)  01/18/21 118 lb (53.5 kg)  01/04/21 117 lb (53.1 kg)    Physical Exam Constitutional:      General: She is not in acute distress.    Appearance: She is well-developed. She is not diaphoretic.  HENT:     Head: Normocephalic and atraumatic.     Right Ear: Tympanic membrane, ear canal and external ear normal.     Left Ear: Tympanic membrane, ear canal and external ear normal.     Mouth/Throat:     Pharynx: No oropharyngeal exudate.  Eyes:     Conjunctiva/sclera: Conjunctivae normal.     Pupils: Pupils are equal, round, and reactive to light.  Cardiovascular:     Rate and Rhythm: Normal rate and regular rhythm.     Heart sounds: Normal heart sounds.  Pulmonary:     Effort: Pulmonary effort is normal.     Breath sounds: Normal breath sounds.  Abdominal:     General: Bowel sounds are normal.     Palpations: Abdomen is soft.  Musculoskeletal:     Cervical back: Normal range of motion and neck supple.     Right lower leg: No edema.     Left lower leg: No edema.  Skin:    General: Skin is warm and dry.  Neurological:     Mental Status: She is alert and oriented to person, place, and time. Mental status is at baseline.  Psychiatric:        Mood and Affect: Mood normal.    Labs reviewed: Basic Metabolic Panel: Recent Labs    01/04/21 0906  NA 144  K 3.9  CL 107  CO2 27  GLUCOSE 93  BUN 26*  CREATININE 1.02*  CALCIUM 9.8   Liver Function Tests: Recent Labs    01/04/21 0906  AST 15  ALT 7  BILITOT 0.3  PROT 7.2   No results for  input(s): LIPASE, AMYLASE in the last 8760 hours. No results for input(s): AMMONIA in the last 8760 hours. CBC: Recent Labs    01/04/21 0906  WBC 5.4  NEUTROABS 2,403  HGB 14.8  HCT 46.4*  MCV 86.9  PLT 207   Lipid Panel: Recent Labs    01/04/21 0906  CHOL 230*  HDL 66  LDLCALC 145*  TRIG 88  CHOLHDL 3.5   TSH: No results for input(s): TSH in the last 8760 hours. A1C: No results found for: HGBA1C   Assessment/Plan 1. Mixed hyperlipidemia -declines medications at this time and working on dietary modifications. - Lipid panel - CMP with eGFR([facility])  2. Gastroesophageal reflux disease, unspecified whether esophagitis present Ongoing, states her mail order pharmacy told her the dexilant was too expensive and therefore she could not get medication. Continues on protonix and dietary/lifestyle modifications. - Ambulatory referral to Gastroenterology for further evaluation and recommends  3. Essential hypertension, benign -Blood pressure well controlled Continue current medications Recheck metabolic panel - CBC with Differential/Platelet - CMP with eGFR([facility])  4. Hypothyroidism due to acquired atrophy of thyroid -continues on synthroid 75 mcg - TSH  5. Age-related osteoporosis without current pathological fracture Declines medications. Continues cal and vit d twice daily with weight bearing exercises.    Next appt: 4 months.  Carlos American. [place], [facility] [phone]

## 2021-04-13 ENCOUNTER — Other Ambulatory Visit: Payer: Self-pay | Admitting: Nurse Practitioner

## 2021-04-13 DIAGNOSIS — E782 Mixed hyperlipidemia: Secondary | ICD-10-CM

## 2021-04-13 LAB — CBC WITH DIFFERENTIAL/PLATELET
Absolute Monocytes: 358 cells/uL (ref 200–950)
Basophils Absolute: 28 cells/uL (ref 0–200)
Basophils Relative: 0.5 %
Eosinophils Absolute: 171 cells/uL (ref 15–500)
Eosinophils Relative: 3.1 %
HCT: 47.9 % — ABNORMAL HIGH (ref 35.0–45.0)
Hemoglobin: 15.5 g/dL (ref 11.7–15.5)
Lymphs Abs: 2327 cells/uL (ref 850–3900)
MCH: 27.9 pg (ref 27.0–33.0)
MCHC: 32.4 g/dL (ref 32.0–36.0)
MCV: 86.2 fL (ref 80.0–100.0)
MPV: 10.4 fL (ref 7.5–12.5)
Monocytes Relative: 6.5 %
Neutro Abs: 2618 cells/uL (ref 1500–7800)
Neutrophils Relative %: 47.6 %
Platelets: 228 10*3/uL (ref 140–400)
RBC: 5.56 10*6/uL — ABNORMAL HIGH (ref 3.80–5.10)
RDW: 13.1 % (ref 11.0–15.0)
Total Lymphocyte: 42.3 %
WBC: 5.5 10*3/uL (ref 3.8–10.8)

## 2021-04-13 LAB — COMPLETE METABOLIC PANEL WITH GFR
AG Ratio: 1.5 (calc) (ref 1.0–2.5)
ALT: 8 U/L (ref 6–29)
AST: 19 U/L (ref 10–35)
Albumin: 4.5 g/dL (ref 3.6–5.1)
Alkaline phosphatase (APISO): 48 U/L (ref 37–153)
BUN/Creatinine Ratio: 31 (calc) — ABNORMAL HIGH (ref 6–22)
BUN: 27 mg/dL — ABNORMAL HIGH (ref 7–25)
CO2: 25 mmol/L (ref 20–32)
Calcium: 10 mg/dL (ref 8.6–10.4)
Chloride: 104 mmol/L (ref 98–110)
Creat: 0.86 mg/dL (ref 0.60–1.00)
Globulin: 3.1 g/dL (calc) (ref 1.9–3.7)
Glucose, Bld: 88 mg/dL (ref 65–99)
Potassium: 3.8 mmol/L (ref 3.5–5.3)
Sodium: 140 mmol/L (ref 135–146)
Total Bilirubin: 0.5 mg/dL (ref 0.2–1.2)
Total Protein: 7.6 g/dL (ref 6.1–8.1)
eGFR: 69 mL/min/{1.73_m2} (ref >=60)

## 2021-04-13 LAB — LIPID PANEL
Cholesterol: 231 mg/dL — ABNORMAL HIGH (ref <200)
HDL: 67 mg/dL (ref >=50)
LDL Cholesterol (Calc): 143 mg/dL (calc) — ABNORMAL HIGH
Non-HDL Cholesterol (Calc): 164 mg/dL (calc) — ABNORMAL HIGH (ref <130)
Total CHOL/HDL Ratio: 3.4 (calc) (ref <5.0)
Triglycerides: 99 mg/dL (ref <150)

## 2021-04-13 LAB — TSH: TSH: 6.26 mIU/L — ABNORMAL HIGH (ref 0.40–4.50)

## 2021-04-13 MED ORDER — EZETIMIBE 10 MG PO TABS
10.0000 mg | ORAL_TABLET | Freq: Every day | ORAL | 1 refills | Status: DC
Start: 2021-04-13 — End: 2021-10-12

## 2021-04-28 ENCOUNTER — Encounter: Payer: Self-pay | Admitting: Nurse Practitioner

## 2021-04-30 ENCOUNTER — Telehealth: Payer: Self-pay | Admitting: *Deleted

## 2021-04-30 NOTE — Telephone Encounter (Signed)
Patient called and stated that she doesn't think the Protonix is working and wants medication change to something different.   At last appointment on 04/12/2021 [name] placed a Referral to GI for further evaluation.    2. Gastroesophageal reflux disease, unspecified whether esophagitis present Ongoing, states her mail order pharmacy told her the dexilant was too expensive and therefore she could not get medication. Continues on protonix and dietary/lifestyle modifications. - Ambulatory referral to Gastroenterology for further evaluation and recommends   [facility] has been trying to contact patient to schedule an appointment. Phone number to [place] [phone] given to patient to give them a call to schedule her an appointment.   I did offer patient an appointment at our office to evaluate but patient stated that she will call [facility] to get an appointment.   FYI

## 2021-04-30 NOTE — Telephone Encounter (Signed)
Thank you :)

## 2021-05-17 ENCOUNTER — Encounter: Payer: Self-pay | Admitting: Gastroenterology

## 2021-05-17 ENCOUNTER — Ambulatory Visit: Payer: Medicare Other | Admitting: Gastroenterology

## 2021-05-17 VITALS — BP 122/70 | HR 82 | Ht 62.0 in | Wt 118.0 lb

## 2021-05-17 DIAGNOSIS — K219 Gastro-esophageal reflux disease without esophagitis: Secondary | ICD-10-CM | POA: Diagnosis not present

## 2021-05-17 MED ORDER — DEXLANSOPRAZOLE 60 MG PO CPDR: 60.0000 mg | DELAYED_RELEASE_CAPSULE | Freq: Every day | ORAL | 3 refills | Status: DC

## 2021-05-17 MED ORDER — FAMOTIDINE 40 MG PO TABS: 40.0000 mg | ORAL_TABLET | Freq: Every day | ORAL | 3 refills | Status: DC

## 2021-05-17 NOTE — Progress Notes (Signed)
05/17/2021 Tina Roberts [id number] [birth year]   HISTORY OF PRESENT ILLNESS: This is a 81 year old female who is a patient of Dr. Lynne Leader.  She has a diagnosis of GERD and LPR.  Continues to complain of globus sensation, phlegm in her throat, drainage, throat clearing, etc.  Most recently she had been on pantoprazole 40 mg daily, but says that she does not feel like that helps at all so discontinued it a couple of weeks ago.  EGD in September 2015 showed a small hiatal hernia and was otherwise normal.  Barium esophagram in November 2013 showed a small sliding hiatal hernia with evidence of GERD and mild mucosal irregularities in the distal esophagus which were not found on EGD.  After her last visit with Dr. Fuller Plan in May 2021 she did follow-up with ENT in June 2021.  He did not comment much on her LPR.  He just said that postnasal drip, etc. could be worsened by reflux.  She denies any heartburn or indigestion down in her chest and no dysphagia.  No abdominal pain.   Past Medical History:  Diagnosis Date   Conjunctivitis unspecified    Disorder of bone and cartilage, unspecified    GERD (gastroesophageal reflux disease)    Hepatic lesion    History of gallstones    Hyperlipidemia    Hypertension    Hypothyroidism    Kidney stone    Lacrimal disorder    Myalgia and myositis, unspecified    Osteoarthrosis, unspecified whether generalized or localized, unspecified site    Osteopenia    Osteoporosis    Osteoporosis, unspecified    Other and unspecified hyperlipidemia    Other dysphagia    Pain in joint, forearm    Reflux esophagitis    Unspecified disorder of kidney and ureter    Unspecified essential hypertension    Unspecified hypothyroidism    Unspecified vitamin D deficiency    Vitamin D deficiency    Vitiligo    Vitiligo    Past Surgical History:  Procedure Laterality Date   COLONOSCOPY     LEG SURGERY     right   TUBAL LIGATION  1973    reports that she has never  smoked. She has never used smokeless tobacco. She reports current alcohol use. She reports that she does not use drugs. family history includes Colon cancer in her nephew; Colon cancer (age of onset: 79) in her mother; Diabetes in her mother and sister. No Known Allergies    Outpatient Encounter Medications as of 05/17/2021  Medication Sig   amLODipine (NORVASC) 10 MG tablet TAKE 1 TABLET BY MOUTH EVERY DAY   Calcium Carb-Cholecalciferol 600-800 MG-UNIT TABS Take 2 tablets by mouth daily.   ezetimibe (ZETIA) 10 MG tablet Take 1 tablet (10 mg total) by mouth daily.   levothyroxine (SYNTHROID) 75 MCG tablet TAKE 1 TABLET BY MOUTH ONCE DAILY FOR THYROID   [DISCONTINUED] clobetasol ointment (TEMOVATE) 4.00 % Apply 1 application topically as needed.   [DISCONTINUED] desonide (DESOWEN) 0.05 % lotion Apply 1 application topically as needed.   [DISCONTINUED] omeprazole (PRILOSEC) 40 MG capsule Take 40 mg by mouth daily.   [DISCONTINUED] pantoprazole (PROTONIX) 40 MG tablet Take 40 mg by mouth daily.   [DISCONTINUED] PROTOPIC 0.1 % ointment Apply 1 application topically as needed.   No facility-administered encounter medications on file as of 05/17/2021.     REVIEW OF SYSTEMS  : All other systems reviewed and negative except where noted in the History of Present  Illness.   PHYSICAL EXAM: BP 122/70    Pulse 82    Ht 5\' 2"  (1.575 m)    Wt 118 lb (53.5 kg)    BMI 21.58 kg/m  General: Well developed AA female in no acute distress Head: Normocephalic and atraumatic Eyes:  Sclerae anicteric, conjunctiva pink. Ears: Normal auditory acuity Lungs: Clear throughout to auscultation; no W/R/R. Heart: Regular rate and rhythm; no M/R/G. Abdomen: Soft, non-distended.  BS present.  Non-tender. Musculoskeletal: Symmetrical with no gross deformities  Skin: No lesions on visible extremities Extremities: No edema  Neurological: Alert oriented x 4, grossly non-focal Psychological:  Alert and cooperative.  Normal mood and affect  ASSESSMENT AND PLAN: *GERD and LPR:  No improvement on pantoprazole 40 mg daily.  She says that she does not feel like it works at all.  Had a follow-up appointment with ENT, but he did not comment much on the LPR.  Just said that reflux can worsen postnasal drip, etc.  She continues to complain of a lot of mucus and phlegm, etc. up in her throat and bringing up acid.  We will try for Dexilant 60 mg daily and will add Pepcid 40 mg daily at bedtime.  Prescription sent to pharmacy.  She will follow-up with either Dr. Fuller Plan or myself in about 4 to 6 weeks to determine efficacy.  If no improvement on that regimen then may need repeat EGD and/or pH study.   CC:  Lauree Chandler, NP

## 2021-05-17 NOTE — Patient Instructions (Signed)
We have sent the following medications to your pharmacy for you to pick up at your convenience: Dexilant 60 mg daily. Pepcid 40 mg nightly.   If you are age 80 or older, your body mass index should be between 23-30. Your Body mass index is 21.58 kg/m. If this is out of the aforementioned range listed, please consider follow up with your Primary Care Provider.  If you are age 80 or younger, your body mass index should be between 19-25. Your Body mass index is 21.58 kg/m. If this is out of the aformentioned range listed, please consider follow up with your Primary Care Provider.   ________________________________________________________  The [facility] providers would like to encourage you to use [facility] to communicate with providers for non-urgent requests or questions.  Due to long hold times on the telephone, sending your provider a message by [facility] may be a faster and more efficient way to get a response.  Please allow 48 business hours for a response.  Please remember that this is for non-urgent requests.  _______________________________________________________

## 2021-05-19 ENCOUNTER — Telehealth: Payer: Self-pay | Admitting: Gastroenterology

## 2021-05-19 NOTE — Telephone Encounter (Signed)
Inbound call from patient stating that she was unable to get Dexilant. Seeking advice if there is a cheaper medication that she can take. Please advise.

## 2021-05-21 MED ORDER — ESOMEPRAZOLE MAGNESIUM 40 MG PO CPDR: 40.0000 mg | DELAYED_RELEASE_CAPSULE | Freq: Two times a day (BID) | ORAL | 5 refills | Status: DC

## 2021-05-21 NOTE — Telephone Encounter (Signed)
Sent to pharmacy for Nexium 40 mg to pharmacy. Patient informed.

## 2021-05-21 NOTE — Telephone Encounter (Signed)
Please advise 

## 2021-06-16 ENCOUNTER — Other Ambulatory Visit: Payer: Self-pay | Admitting: Nurse Practitioner

## 2021-06-29 ENCOUNTER — Ambulatory Visit: Payer: Medicare Other | Admitting: Gastroenterology

## 2021-06-29 ENCOUNTER — Encounter: Payer: Self-pay | Admitting: Gastroenterology

## 2021-06-29 VITALS — BP 160/90 | HR 84 | Ht 62.0 in | Wt 115.2 lb

## 2021-06-29 DIAGNOSIS — R0982 Postnasal drip: Secondary | ICD-10-CM

## 2021-06-29 DIAGNOSIS — R0989 Other specified symptoms and signs involving the circulatory and respiratory systems: Secondary | ICD-10-CM | POA: Diagnosis not present

## 2021-06-29 MED ORDER — PANTOPRAZOLE SODIUM 40 MG PO TBEC: 40.0000 mg | DELAYED_RELEASE_TABLET | Freq: Two times a day (BID) | ORAL | 11 refills | Status: DC

## 2021-06-29 NOTE — Progress Notes (Signed)
? ? ?  History of Present Illness: This is a 81 year old female complaining of phlegm in her throat, throat clearing, post nasal drip. No heartburn, regurgitation. She has history of GERD and LPR however her symptoms did not respond to pantoprazole 40 mg po qd. She was changed to Dexilant 60 mg po qam and famotidine 40 mg po qhs in mid February. Unfortunately Dexilant was not covered and Nexium 40 mg po bid was not well covered. She is currently taking on famotidine 40 mg hs and feels it has helped her nighttime symptoms. EGD in 2016 showed a small hiatal hernia otherwise negative.  ? ?Current Medications, Allergies, Past Medical History, Past Surgical History, Family History and Social History were reviewed in [employer] record. ? ? ?Physical Exam: ?General: Well developed, well nourished, no acute distress ?Head: Normocephalic and atraumatic ?Eyes: Sclerae anicteric, EOMI ?Ears: Normal auditory acuity ?Mouth: Not examined, mask on during Covid-19 pandemic ?Lungs: Clear throughout to auscultation ?Heart: Regular rate and rhythm; no murmurs, rubs or bruits ?Abdomen: Soft, non tender and non distended. No masses, hepatosplenomegaly or hernias noted. Normal Bowel sounds ?Rectal: Not done ?Musculoskeletal: Symmetrical with no gross deformities  ?Pulses:  Normal pulses noted ?Extremities: No clubbing, cyanosis, edema or deformities noted ?Neurological: Alert oriented x 4, grossly nonfocal ?Psychological:  Alert and cooperative. Normal mood and affect ? ? ?Assessment and Recommendations: ? ?GERD with LPR. Post nasal drip and other symptoms concerning for an ENT disorder.  Pantoprazole 40 mg p.o. twice daily taken 30 minutes before breakfast and dinner.  Continue famotidine 40 mg at bedtime.  Follow antireflux measures.  Offered EGD for further evaluation and she prefers to try the new medication combination before considering EGD.  Consider ENT referral pending symptom response.  REV in 6 to 8  weeks. ?Hepatitis C treated successfully with Mavyret in 2022.  ?

## 2021-06-29 NOTE — Patient Instructions (Signed)
We have sent the following medications to your pharmacy for you to pick up at your convenience: pantoprazole 40 mg one tablet by mouth twice daily before breakfast and dinner. ? ?Remain on famotidine at bedtime.  ? ?The [facility] providers would like to encourage you to use [facility] to communicate with providers for non-urgent requests or questions.  Due to long hold times on the telephone, sending your provider a message by [facility] may be a faster and more efficient way to get a response.  Please allow 48 business hours for a response.  Please remember that this is for non-urgent requests.  ? ?Thank you for choosing me and [facility]. ? ?Malcolm T. Dagoberto Ligas., MD., [facility] ? ? ?

## 2021-07-03 ENCOUNTER — Other Ambulatory Visit: Payer: Self-pay | Admitting: Nurse Practitioner

## 2021-07-07 ENCOUNTER — Other Ambulatory Visit: Payer: Self-pay | Admitting: *Deleted

## 2021-07-07 DIAGNOSIS — I10 Essential (primary) hypertension: Secondary | ICD-10-CM

## 2021-07-07 MED ORDER — AMLODIPINE BESYLATE 10 MG PO TABS: 10.0000 mg | ORAL_TABLET | Freq: Every day | ORAL | 1 refills | Status: DC

## 2021-07-07 NOTE — Telephone Encounter (Signed)
amLODipine (NORVASC) 5 MG tablet [Pharmacy Med Name: amLODIPine Besylate 5 MG Oral Tablet] 90 tablet 3 07/05/2021    ?Request refused: Patient has requested refill too soon   ?Sig: TAKE 1 TABLET BY MOUTH  DAILY   ?Notes to Pharmacy: Requesting 1 year supply   ?Renewals ? ?Renewal provider: Lauree Chandler, NP ? ?  ?   ? ? ? ?Patient called upset because she only has #4 tablets left of her BP medication. [facility] told her that they sent a refill request 07/03/2021 but it was Denied.  ?Patient has to get it through the mail and now she won't have enough.  ? ?Patient requested Rx. Faxed.  ?

## 2021-07-16 ENCOUNTER — Encounter: Payer: Medicare Other | Admitting: Nurse Practitioner

## 2021-07-20 ENCOUNTER — Encounter: Payer: Self-pay | Admitting: Nurse Practitioner

## 2021-07-20 ENCOUNTER — Telehealth: Payer: Self-pay

## 2021-07-20 ENCOUNTER — Ambulatory Visit (INDEPENDENT_AMBULATORY_CARE_PROVIDER_SITE_OTHER): Payer: Medicare Other | Admitting: Nurse Practitioner

## 2021-07-20 DIAGNOSIS — Z0000 Encounter for general adult medical examination without abnormal findings: Secondary | ICD-10-CM | POA: Diagnosis not present

## 2021-07-20 DIAGNOSIS — E2839 Other primary ovarian failure: Secondary | ICD-10-CM | POA: Diagnosis not present

## 2021-07-20 NOTE — Patient Instructions (Signed)
Tina Roberts , ?Thank you for taking time to come for your Medicare Wellness Visit. I appreciate your ongoing commitment to your health goals. Please review the following plan we discussed and let me know if I can assist you in the future.  ? ?Screening recommendations/referrals: ?Colonoscopy aged out ?Mammogram aged out ?Bone Density DUE in June 2023- call [phone] ?Recommended yearly ophthalmology/optometry visit for glaucoma screening and checkup ?Recommended yearly dental visit for hygiene and checkup ? ?Vaccinations: ?Influenza vaccine up to date ?Pneumococcal vaccine up to tdate ?Tdap vaccine DUE- recommend to get at your local pharmacy    ?Shingles vaccine DUE- recommend to get at your local pharmacy      ? ?Advanced directives: on file  ? ?Conditions/risks identified: advanced age, osteoporosis, memory loss ? ?Next appointment: yearly for awv ? ? ?Preventive Care 37 Years and Older, Female ?Preventive care refers to lifestyle choices and visits with your health care provider that can promote health and wellness. ?What does preventive care include? ?A yearly physical exam. This is also called an annual well check. ?Dental exams once or twice a year. ?Routine eye exams. Ask your health care provider how often you should have your eyes checked. ?Personal lifestyle choices, including: ?Daily care of your teeth and gums. ?Regular physical activity. ?Eating a healthy diet. ?Avoiding tobacco and drug use. ?Limiting alcohol use. ?Practicing safe sex. ?Taking low-dose aspirin every day. ?Taking vitamin and mineral supplements as recommended by your health care provider. ?What happens during an annual well check? ?The services and screenings done by your health care provider during your annual well check will depend on your age, overall health, lifestyle risk factors, and family history of disease. ?Counseling  ?Your health care provider may ask you questions about your: ?Alcohol use. ?Tobacco use. ?Drug  use. ?Emotional well-being. ?Home and relationship well-being. ?Sexual activity. ?Eating habits. ?History of falls. ?Memory and ability to understand (cognition). ?Work and work Statistician. ?Reproductive health. ?Screening  ?You may have the following tests or measurements: ?Height, weight, and BMI. ?Blood pressure. ?Lipid and cholesterol levels. These may be checked every 5 years, or more frequently if you are over 49 years old. ?Skin check. ?Lung cancer screening. You may have this screening every year starting at age 84 if you have a 30-pack-year history of smoking and currently smoke or have quit within the past 15 years. ?Fecal occult blood test (FOBT) of the stool. You may have this test every year starting at age 29. ?Flexible sigmoidoscopy or colonoscopy. You may have a sigmoidoscopy every 5 years or a colonoscopy every 10 years starting at age 20. ?Hepatitis C blood test. ?Hepatitis B blood test. ?Sexually transmitted disease (STD) testing. ?Diabetes screening. This is done by checking your blood sugar (glucose) after you have not eaten for a while (fasting). You may have this done every 1-3 years. ?Bone density scan. This is done to screen for osteoporosis. You may have this done starting at age 59. ?Mammogram. This may be done every 1-2 years. Talk to your health care provider about how often you should have regular mammograms. ?Talk with your health care provider about your test results, treatment options, and if necessary, the need for more tests. ?Vaccines  ?Your health care provider may recommend certain vaccines, such as: ?Influenza vaccine. This is recommended every year. ?Tetanus, diphtheria, and acellular pertussis (Tdap, Td) vaccine. You may need a Td booster every 10 years. ?Zoster vaccine. You may need this after age 63. ?Pneumococcal 13-valent conjugate (PCV13) vaccine. One dose  is recommended after age 24. ?Pneumococcal polysaccharide (PPSV23) vaccine. One dose is recommended after age  104. ?Talk to your health care provider about which screenings and vaccines you need and how often you need them. ?This information is not intended to replace advice given to you by your health care provider. Make sure you discuss any questions you have with your health care provider. ?Document Released: 04/17/2015 Document Revised: 12/09/2015 Document Reviewed: 01/20/2015 ?Elsevier Interactive Patient Education ? 2017 [place]. ? ?Fall Prevention in the Home ?Falls can cause injuries. They can happen to people of all ages. There are many things you can do to make your home safe and to help prevent falls. ?What can I do on the outside of my home? ?Regularly fix the edges of walkways and driveways and fix any cracks. ?Remove anything that might make you trip as you walk through a door, such as a raised step or threshold. ?Trim any bushes or trees on the path to your home. ?Use bright outdoor lighting. ?Clear any walking paths of anything that might make someone trip, such as rocks or tools. ?Regularly check to see if handrails are loose or broken. Make sure that both sides of any steps have handrails. ?Any raised decks and porches should have guardrails on the edges. ?Have any leaves, snow, or ice cleared regularly. ?Use sand or salt on walking paths during winter. ?Clean up any spills in your garage right away. This includes oil or grease spills. ?What can I do in the bathroom? ?Use night lights. ?Install grab bars by the toilet and in the tub and shower. Do not use towel bars as grab bars. ?Use non-skid mats or decals in the tub or shower. ?If you need to sit down in the shower, use a plastic, non-slip stool. ?Keep the floor dry. Clean up any water that spills on the floor as soon as it happens. ?Remove soap buildup in the tub or shower regularly. ?Attach bath mats securely with double-sided non-slip rug tape. ?Do not have throw rugs and other things on the floor that can make you trip. ?What can I do in the  bedroom? ?Use night lights. ?Make sure that you have a light by your bed that is easy to reach. ?Do not use any sheets or blankets that are too big for your bed. They should not hang down onto the floor. ?Have a firm chair that has side arms. You can use this for support while you get dressed. ?Do not have throw rugs and other things on the floor that can make you trip. ?What can I do in the kitchen? ?Clean up any spills right away. ?Avoid walking on wet floors. ?Keep items that you use a lot in easy-to-reach places. ?If you need to reach something above you, use a strong step stool that has a grab bar. ?Keep electrical cords out of the way. ?Do not use floor polish or wax that makes floors slippery. If you must use wax, use non-skid floor wax. ?Do not have throw rugs and other things on the floor that can make you trip. ?What can I do with my stairs? ?Do not leave any items on the stairs. ?Make sure that there are handrails on both sides of the stairs and use them. Fix handrails that are broken or loose. Make sure that handrails are as long as the stairways. ?Check any carpeting to make sure that it is firmly attached to the stairs. Fix any carpet that is loose or worn. ?Avoid  having throw rugs at the top or bottom of the stairs. If you do have throw rugs, attach them to the floor with carpet tape. ?Make sure that you have a light switch at the top of the stairs and the bottom of the stairs. If you do not have them, ask someone to add them for you. ?What else can I do to help prevent falls? ?Wear shoes that: ?Do not have high heels. ?Have rubber bottoms. ?Are comfortable and fit you well. ?Are closed at the toe. Do not wear sandals. ?If you use a stepladder: ?Make sure that it is fully opened. Do not climb a closed stepladder. ?Make sure that both sides of the stepladder are locked into place. ?Ask someone to hold it for you, if possible. ?Clearly mark and make sure that you can see: ?Any grab bars or  handrails. ?First and last steps. ?Where the edge of each step is. ?Use tools that help you move around (mobility aids) if they are needed. These include: ?Canes. ?Walkers. ?Scooters. ?Crutches. ?Turn on the lights when you go into a

## 2021-07-20 NOTE — Telephone Encounter (Signed)
Ms. zoye, chandra are scheduled for a virtual visit with your provider today.   ? ?Just as we do with appointments in the office, we must obtain your consent to participate.  Your consent will be active for this visit and any virtual visit you may have with one of our providers in the next 365 days.   ? ?If you have a MyChart account, I can also send a copy of this consent to you electronically.  All virtual visits are billed to your insurance company just like a traditional visit in the office.  As this is a virtual visit, video technology does not allow for your provider to perform a traditional examination.  This may limit your provider's ability to fully assess your condition.  If your provider identifies any concerns that need to be evaluated in person or the need to arrange testing such as labs, EKG, etc, we will make arrangements to do so.   ? ?Although advances in technology are sophisticated, we cannot ensure that it will always work on either your end or our end.  If the connection with a video visit is poor, we may have to switch to a telephone visit.  With either a video or telephone visit, we are not always able to ensure that we have a secure connection.   I need to obtain your verbal consent now.   Are you willing to proceed with your visit today?  ? ?Terena Bohan has provided verbal consent on 07/20/2021 for a virtual visit (video or telephone). ? ? ?Carroll Kinds, CMA ?07/20/2021  3:39 PM ?  ?

## 2021-07-20 NOTE — Progress Notes (Signed)
? ?Subjective:  ? Tina Roberts is a 81 y.o. female who presents for Medicare Annual (Subsequent) preventive examination. ? ?Review of Systems    ? ?Cardiac Risk Factors include: advanced age (>35mn, >>76women);hypertension;dyslipidemia ? ?   ?Objective:  ?  ?There were no vitals filed for this visit. ?There is no height or weight on file to calculate BMI. ? ? ?  07/20/2021  ?  3:33 PM 04/12/2021  ?  8:06 AM 01/04/2021  ?  8:28 AM 07/15/2020  ? 12:37 PM 07/10/2020  ?  9:53 AM 01/10/2020  ?  9:33 AM 12/05/2018  ? 10:13 AM  ?Advanced Directives  ?Does Patient Have a Medical Advance Directive? Yes Yes Yes Yes Yes Yes Yes  ?Type of [place]amed[place]of [place] will Healthcare Power of [place] will [place] will [place] will Healthcare Power of Attorney  ?Does patient want to make changes to medical advance directive? No - Patient declined No - Patient declined No - Patient declined No - Patient declined No - Patient declined No - Patient declined No - Patient declined  ?Copy of [place] Chart? Yes - validated most recent copy scanned in chart (See row information) Yes - validated most recent copy scanned in chart (See row information) Yes - validated most recent copy scanned in chart (See row information) Yes - validated most recent copy scanned in chart (See row information) Yes - validated most recent copy scanned in chart (See row information) Yes - validated most recent copy scanned in chart (See row information) Yes - validated most recent copy scanned in chart (See row information)  ? ? ?Current Medications (verified) ?Outpatient Encounter Medications as of 07/20/2021  ?Medication Sig  ? amLODipine (NORVASC) 10 MG tablet Take 1 tablet (10 mg total) by mouth daily.  ? Calcium Carb-Cholecalciferol 600-800 MG-UNIT TABS Take 2 tablets by mouth daily.  ?  ezetimibe (ZETIA) 10 MG tablet Take 1 tablet (10 mg total) by mouth daily.  ? famotidine (PEPCID) 40 MG tablet Take 1 tablet (40 mg total) by mouth at bedtime.  ? levothyroxine (SYNTHROID) 75 MCG tablet TAKE 1 TABLET BY MOUTH ONCE DAILY FOR THYROID  ? pantoprazole (PROTONIX) 40 MG tablet Take 1 tablet (40 mg total) by mouth 2 (two) times daily before a meal.  ? ?No facility-administered encounter medications on file as of 07/20/2021.  ? ? ?Allergies (verified) ?Patient has no known allergies.  ? ?History: ?Past Medical History:  ?Diagnosis Date  ? Conjunctivitis unspecified   ? Disorder of bone and cartilage, unspecified   ? GERD (gastroesophageal reflux disease)   ? Hepatic lesion   ? History of gallstones   ? Hyperlipidemia   ? Hypertension   ? Hypothyroidism   ? Kidney stone   ? Lacrimal disorder   ? Myalgia and myositis, unspecified   ? Osteoarthrosis, unspecified whether generalized or localized, unspecified site   ? Osteopenia   ? Osteoporosis   ? Osteoporosis, unspecified   ? Other and unspecified hyperlipidemia   ? Other dysphagia   ? Pain in joint, forearm   ? Reflux esophagitis   ? Unspecified disorder of kidney and ureter   ? Unspecified essential hypertension   ? Unspecified hypothyroidism   ? Unspecified vitamin D deficiency   ? Vitamin D deficiency   ? Vitiligo   ? Vitiligo   ? ?Past Surgical History:  ?Procedure Laterality Date  ?  COLONOSCOPY    ? LEG SURGERY    ? right  ? TUBAL LIGATION  1973  ? ?Family History  ?Problem Relation Age of Onset  ? Colon cancer Mother 47  ? Diabetes Mother   ? Diabetes Sister   ? Colon cancer Nephew   ? Esophageal cancer Neg Hx   ? Rectal cancer Neg Hx   ? Stomach cancer Neg Hx   ? ?Social History  ? ?Socioeconomic History  ? Marital status: Married  ?  Spouse name: Not on file  ? Number of children: 4  ? Years of education: Not on file  ? Highest education level: Not on file  ?Occupational History  ? Not on file  ?Tobacco Use  ? Smoking status: Never  ? Smokeless  tobacco: Never  ?Vaping Use  ? Vaping Use: Never used  ?Substance and Sexual Activity  ? Alcohol use: Yes  ?  Alcohol/week: 0.0 standard drinks  ?  Comment: occasional drink wine   ? Drug use: No  ? Sexual activity: Not Currently  ?Other Topics Concern  ? Not on file  ?Social History Narrative  ? Not on file  ? ?Social Determinants of Health  ? ?Financial Resource Strain: Not on file  ?Food Insecurity: Not on file  ?Transportation Needs: Not on file  ?Physical Activity: Not on file  ?Stress: Not on file  ?Social Connections: Not on file  ? ? ?Tobacco Counseling ?Counseling given: Not Answered ? ? ?Clinical Intake: ? ?Pre-visit preparation completed: Yes ? ?Pain : No/denies pain ? ?  ? ?BMI - recorded: 21 ?Nutritional Status: BMI of 19-24  Normal ?Diabetes: No ? ?How often do you need to have someone help you when you read instructions, pamphlets, or other written materials from your doctor or pharmacy?: 3 - Sometimes ? ?Diabetic?no ? ?  ? ?  ? ? ?Activities of Daily Living ? ?  07/20/2021  ?  3:43 PM  ?In your present state of health, do you have any difficulty performing the following activities:  ?Hearing? 1  ?Vision? 0  ?Difficulty concentrating or making decisions? 1  ?Comment forgetful  ?Walking or climbing stairs? 0  ?Dressing or bathing? 0  ?Doing errands, shopping? 0  ?Preparing Food and eating ? N  ?Using the Toilet? N  ?In the past six months, have you accidently leaked urine? N  ?Do you have problems with loss of bowel control? N  ?Managing your Medications? N  ?Managing your Finances? N  ?Housekeeping or managing your Housekeeping? N  ? ? ?Patient Care Team: ?Lauree Chandler, NP as PCP - General (Geriatric Medicine) ? ?Indicate any recent Medical Services you may have received from other than [facility] providers in the past year (date may be approximate). ? ?   ?Assessment:  ? This is a routine wellness examination for Tina Roberts. ? ?Hearing/Vision screen ?Hearing Screening - Comments:: Patient has slight  hearing problems. ?Vision Screening - Comments:: Patient has some vision problems at night. Patient has had yearly eye exam. Patient sees Dr. Gershon Crane ? ?Dietary issues and exercise activities discussed: ?Current Exercise Habits: Home exercise routine, Time (Minutes): 60, Frequency (Times/Week): 4, Weekly Exercise (Minutes/Week): 240 ? ? Goals Addressed   ?None ?  ?Depression Screen ? ?  07/20/2021  ?  3:29 PM 04/12/2021  ?  8:06 AM 07/14/2020  ? 11:07 AM 01/20/2020  ? 10:10 AM 01/10/2020  ?  9:33 AM 04/08/2019  ?  9:46 AM 12/05/2018  ? 10:14 AM  ?PHQ 2/9  Scores  ?PHQ - 2 Score 0 0 0 0 0 0 0  ?  ?Fall Risk ? ?  07/20/2021  ?  3:31 PM 04/12/2021  ?  8:05 AM 01/04/2021  ?  8:38 AM 07/15/2020  ? 12:36 PM 07/14/2020  ? 11:07 AM  ?Fall Risk   ?Falls in the past year? 0 0 0 1 1  ?Number falls in past yr: 0 0 0 0 0  ?Injury with Fall? 0 0 0 0 0  ?Risk for fall due to : No Fall Risks No Fall Risks No Fall Risks  History of fall(s)  ?Follow up Falls evaluation completed Falls evaluation completed Falls evaluation completed  Falls evaluation completed  ? ? ?FALL RISK PREVENTION PERTAINING TO THE HOME: ? ?Any stairs in or around the home? No  ?If so, are there any without handrails?  na ?Home free of loose throw rugs in walkways, pet beds, electrical cords, etc? Yes  ?Adequate lighting in your home to reduce risk of falls? Yes  ? ?ASSISTIVE DEVICES UTILIZED TO PREVENT FALLS: ? ?Life alert? No  ?Use of a cane, walker or w/c? No  ?Grab bars in the bathroom? No  ?Shower chair or bench in shower? Yes  ?Elevated toilet seat or a handicapped toilet? Yes  ? ?TIMED UP AND GO: ? ?Was the test performed? No .  ? ?Cognitive Function: ? ?  12/05/2018  ? 10:21 AM 12/01/2017  ? 10:15 AM 11/25/2016  ? 10:19 AM 03/16/2016  ?  8:50 AM 01/09/2015  ?  3:17 PM  ?MMSE - Mini Mental State Exam  ?Orientation to time '5 5 5 5 5  '$ ?Orientation to Place '5 5 5 5 5  '$ ?Registration '3 3 3 3 3  '$ ?Attention/ Calculation 3 0 0 0 0  ?Recall '2 3 1 3 3  '$ ?Language- name 2 objects '2 2 2 2  2  '$ ?Language- repeat '1 1 1 1 1  '$ ?Language- follow 3 step command '2 3 3 3 3  '$ ?Language- follow 3 step command-comments Folded paper x 2      ?Language- read & follow direction 0 '1 1 1 1  '$ ?Language-read & follow dire

## 2021-07-20 NOTE — Progress Notes (Signed)
This service is provided via telemedicine ? ?No vital signs collected/recorded due to the encounter was a telemedicine visit.  ? ?Location of patient (ex: home, work):  Home ? ?Patient consents to a telephone visit:  Yes, see encounter dated 07/20/2021 ? ?Location of the provider (ex: office, home):  [place] ? ?Name of any referring provider:  N/A ? ?Names of all persons participating in the telemedicine service and their role in the encounter:  Sherrie Mustache, Nurse Practitioner, Carroll Kinds, CMA, and patient.  ? ?Time spent on call:  11 minutes with medical assistant ? ?

## 2021-07-22 ENCOUNTER — Encounter: Payer: Medicare Other | Admitting: Nurse Practitioner

## 2021-08-11 ENCOUNTER — Ambulatory Visit: Payer: Medicare Other | Admitting: Gastroenterology

## 2021-08-16 ENCOUNTER — Ambulatory Visit (INDEPENDENT_AMBULATORY_CARE_PROVIDER_SITE_OTHER): Payer: Medicare Other | Admitting: Nurse Practitioner

## 2021-08-16 ENCOUNTER — Encounter: Payer: Self-pay | Admitting: Nurse Practitioner

## 2021-08-16 VITALS — BP 122/74 | HR 77 | Temp 97.0°F | Resp 20 | Ht 62.0 in | Wt 119.0 lb

## 2021-08-16 DIAGNOSIS — E782 Mixed hyperlipidemia: Secondary | ICD-10-CM | POA: Diagnosis not present

## 2021-08-16 DIAGNOSIS — M810 Age-related osteoporosis without current pathological fracture: Secondary | ICD-10-CM

## 2021-08-16 DIAGNOSIS — I10 Essential (primary) hypertension: Secondary | ICD-10-CM | POA: Diagnosis not present

## 2021-08-16 DIAGNOSIS — K219 Gastro-esophageal reflux disease without esophagitis: Secondary | ICD-10-CM

## 2021-08-16 DIAGNOSIS — E034 Atrophy of thyroid (acquired): Secondary | ICD-10-CM

## 2021-08-16 NOTE — Progress Notes (Signed)
? ? ?Careteam: ?Patient Care Team: ?Lauree Chandler, NP as PCP - General (Geriatric Medicine) ? ?PLACE OF SERVICE:  ?[facility]  ?Advanced Directive information ?Does Patient Have a Medical Advance Directive?: Yes, Type of Advance Directive: [place];Living will, Does patient want to make changes to medical advance directive?: No - Patient declined ? ?No Known Allergies ? ?Chief Complaint  ?Patient presents with  ? Medical Management of Chronic Issues  ?  4 month follow-up. Discuss need for shingrix and additional covid boosters or post pone if patient refuses. [facility] verified.   ? ? ? ?HPI: Patient is a 81 y.o. female for routine follow up  ? ?Reflux is much better on protonix twice daily and pepcid at bedtime. She needs to reschedule follow up but symptoms have improved and she is able to rest.  ? ?Hypothyroid- on levothyroxine 75 mcg daily (tries not to forget) ? ?Hyperlipidemia- taking zetia, no side effects  ? ?Review of Systems:  ?Review of Systems  ?Constitutional:  Negative for chills, fever and weight loss.  ?HENT:  Negative for tinnitus.   ?Respiratory:  Negative for cough, sputum production and shortness of breath.   ?Cardiovascular:  Negative for chest pain, palpitations and leg swelling.  ?Gastrointestinal:  Negative for abdominal pain, constipation, diarrhea and heartburn.  ?Genitourinary:  Negative for dysuria, frequency and urgency.  ?Musculoskeletal:  Negative for back pain, falls, joint pain and myalgias.  ?Skin: Negative.   ?Neurological:  Negative for dizziness and headaches.  ?Psychiatric/Behavioral:  Negative for depression and memory loss. The patient does not have insomnia.   ? ?Past Medical History:  ?Diagnosis Date  ? Conjunctivitis unspecified   ? Disorder of bone and cartilage, unspecified   ? GERD (gastroesophageal reflux disease)   ? Hepatic lesion   ? History of gallstones   ? Hyperlipidemia   ? Hypertension   ? Hypothyroidism   ? Kidney stone   ? Lacrimal  disorder   ? Myalgia and myositis, unspecified   ? Osteoarthrosis, unspecified whether generalized or localized, unspecified site   ? Osteopenia   ? Osteoporosis   ? Osteoporosis, unspecified   ? Other and unspecified hyperlipidemia   ? Other dysphagia   ? Pain in joint, forearm   ? Reflux esophagitis   ? Unspecified disorder of kidney and ureter   ? Unspecified essential hypertension   ? Unspecified hypothyroidism   ? Unspecified vitamin D deficiency   ? Vitamin D deficiency   ? Vitiligo   ? Vitiligo   ? ?Past Surgical History:  ?Procedure Laterality Date  ? COLONOSCOPY    ? LEG SURGERY    ? right  ? TUBAL LIGATION  1973  ? ?Social History: ?  reports that she has never smoked. She has never used smokeless tobacco. She reports current alcohol use. She reports that she does not use drugs. ? ?Family History  ?Problem Relation Age of Onset  ? Colon cancer Mother 18  ? Diabetes Mother   ? Diabetes Sister   ? Colon cancer Nephew   ? Esophageal cancer Neg Hx   ? Rectal cancer Neg Hx   ? Stomach cancer Neg Hx   ? ? ?Medications: ?Patient's Medications  ?New Prescriptions  ? No medications on file  ?Previous Medications  ? AMLODIPINE (NORVASC) 10 MG TABLET    Take 1 tablet (10 mg total) by mouth daily.  ? CALCIUM CARB-CHOLECALCIFEROL 600-800 MG-UNIT TABS    Take 2 tablets by mouth daily.  ? EZETIMIBE (  ZETIA) 10 MG TABLET    Take 1 tablet (10 mg total) by mouth daily.  ? FAMOTIDINE (PEPCID) 40 MG TABLET    Take 1 tablet (40 mg total) by mouth at bedtime.  ? LEVOTHYROXINE (SYNTHROID) 75 MCG TABLET    TAKE 1 TABLET BY MOUTH ONCE DAILY FOR THYROID  ? PANTOPRAZOLE (PROTONIX) 40 MG TABLET    Take 1 tablet (40 mg total) by mouth 2 (two) times daily before a meal.  ?Modified Medications  ? No medications on file  ?Discontinued Medications  ? No medications on file  ? ? ?Physical Exam: ? ?Vitals:  ? 08/16/21 0842  ?BP: 122/74  ?Pulse: 77  ?Resp: 20  ?Temp: (!) 97 ?F (36.1 ?C)  ?TempSrc: Temporal  ?SpO2: 97%  ?Weight: 119 lb (54 kg)   ?Height: _0  (1.575 m)  ? ?Body mass index is 21.77 kg/m?. ?Wt Readings from Last 3 Encounters:  ?08/16/21 119 lb (54 kg)  ?06/29/21 115 lb 3.2 oz (52.3 kg)  ?05/17/21 118 lb (53.5 kg)  ? ? ?Physical Exam ?Constitutional:   ?   General: She is not in acute distress. ?   Appearance: She is well-developed. She is not diaphoretic.  ?HENT:  ?   Head: Normocephalic and atraumatic.  ?   Mouth/Throat:  ?   Pharynx: No oropharyngeal exudate.  ?Eyes:  ?   Conjunctiva/sclera: Conjunctivae normal.  ?   Pupils: Pupils are equal, round, and reactive to light.  ?Cardiovascular:  ?   Rate and Rhythm: Normal rate and regular rhythm.  ?   Heart sounds: Normal heart sounds.  ?Pulmonary:  ?   Effort: Pulmonary effort is normal.  ?   Breath sounds: Normal breath sounds.  ?Abdominal:  ?   General: Bowel sounds are normal.  ?   Palpations: Abdomen is soft.  ?Musculoskeletal:  ?   Cervical back: Normal range of motion and neck supple.  ?   Right lower leg: No edema.  ?   Left lower leg: No edema.  ?Skin: ?   General: Skin is warm and dry.  ?Neurological:  ?   Mental Status: She is alert and oriented to person, place, and time. Mental status is at baseline.  ?Psychiatric:     ?   Mood and Affect: Mood normal.  ? ? ?Labs reviewed: ?Basic Metabolic Panel: ?Recent Labs  ?  01/04/21 ?5784 04/12/21 ?0827  ?NA 144 140  ?K 3.9 3.8  ?CL 107 104  ?CO2 27 25  ?GLUCOSE 93 88  ?BUN 26* 27*  ?CREATININE 1.02* 0.86  ?CALCIUM 9.8 10.0  ?TSH  --  6.26*  ? ?Liver Function Tests: ?Recent Labs  ?  01/04/21 ?6962 04/12/21 ?0827  ?AST 15 19  ?ALT 7 8  ?BILITOT 0.3 0.5  ?PROT 7.2 7.6  ? ?No results for input(s): LIPASE, AMYLASE in the last 8760 hours. ?No results for input(s): AMMONIA in the last 8760 hours. ?CBC: ?Recent Labs  ?  01/04/21 ?9528 04/12/21 ?0827  ?WBC 5.4 5.5  ?NEUTROABS 2,403 2,618  ?HGB 14.8 15.5  ?HCT 46.4* 47.9*  ?MCV 86.9 86.2  ?PLT 207 228  ? ?Lipid Panel: ?Recent Labs  ?  01/04/21 ?4132 04/12/21 ?0827  ?CHOL 230* 231*  ?HDL 66 67   ?LDLCALC 145* 143*  ?TRIG 88 99  ?CHOLHDL 3.5 3.4  ? ?TSH: ?Recent Labs  ?  04/12/21 ?0827  ?TSH 6.26*  ? ?A1C: ?No results found for: HGBA1C ? ? ?Assessment/Plan ?1. Mixed hyperlipidemia ?-continues on  zetia with lifestyle modifications.  ?- Lipid panel ?- CMP with eGFR([facility]) ? ?2. Essential hypertension, benign ?-Blood pressure well controlled ?Continue current medications ?Recheck metabolic panel ? ?3. Gastroesophageal reflux disease, unspecified whether esophagitis present ?-stable on protonix BID and pecid daily at bedtime.  ? ?4. Hypothyroidism due to acquired atrophy of thyroid ?-continues on synthroid  ?- TSH ? ?5. Age-related osteoporosis without current pathological fracture ?-declines medication at this time, did not tolerate fosamax due to GERD ?-continues on cal and vit d twice daily  ? ? ?Return in about 6 months (around 02/16/2022) for routine follow up . ?Carlos American. Dewaine Oats, AGNP ? ?[facility] ?[phone]  ?

## 2021-08-17 LAB — COMPLETE METABOLIC PANEL WITH GFR
AG Ratio: 1.5 (calc) (ref 1.0–2.5)
ALT: 6 U/L (ref 6–29)
AST: 16 U/L (ref 10–35)
Albumin: 4.4 g/dL (ref 3.6–5.1)
Alkaline phosphatase (APISO): 44 U/L (ref 37–153)
BUN: 22 mg/dL (ref 7–25)
CO2: 29 mmol/L (ref 20–32)
Calcium: 9.4 mg/dL (ref 8.6–10.4)
Chloride: 105 mmol/L (ref 98–110)
Creat: 0.94 mg/dL (ref 0.60–1.00)
Globulin: 3 g/dL (calc) (ref 1.9–3.7)
Glucose, Bld: 93 mg/dL (ref 65–99)
Potassium: 4 mmol/L (ref 3.5–5.3)
Sodium: 141 mmol/L (ref 135–146)
Total Bilirubin: 0.4 mg/dL (ref 0.2–1.2)
Total Protein: 7.4 g/dL (ref 6.1–8.1)
eGFR: 62 mL/min/{1.73_m2} (ref >=60)

## 2021-08-17 LAB — LIPID PANEL
Cholesterol: 186 mg/dL (ref <200)
HDL: 75 mg/dL (ref >=50)
LDL Cholesterol (Calc): 95 mg/dL (calc)
Non-HDL Cholesterol (Calc): 111 mg/dL (calc) (ref <130)
Total CHOL/HDL Ratio: 2.5 (calc) (ref <5.0)
Triglycerides: 69 mg/dL (ref <150)

## 2021-08-17 LAB — TSH: TSH: 2.31 mIU/L (ref 0.40–4.50)

## 2021-08-27 ENCOUNTER — Other Ambulatory Visit: Payer: Self-pay

## 2021-08-27 MED ORDER — FAMOTIDINE 40 MG PO TABS: 40.0000 mg | ORAL_TABLET | Freq: Every day | ORAL | 3 refills | Status: DC

## 2021-08-30 ENCOUNTER — Other Ambulatory Visit: Payer: Self-pay | Admitting: Nurse Practitioner

## 2021-08-30 DIAGNOSIS — E782 Mixed hyperlipidemia: Secondary | ICD-10-CM

## 2021-09-19 ENCOUNTER — Other Ambulatory Visit: Payer: Self-pay | Admitting: Nurse Practitioner

## 2021-09-19 DIAGNOSIS — I10 Essential (primary) hypertension: Secondary | ICD-10-CM

## 2021-10-07 ENCOUNTER — Other Ambulatory Visit: Payer: Self-pay | Admitting: Nurse Practitioner

## 2021-10-07 DIAGNOSIS — Z1231 Encounter for screening mammogram for malignant neoplasm of breast: Secondary | ICD-10-CM

## 2021-10-12 ENCOUNTER — Other Ambulatory Visit: Payer: Self-pay | Admitting: Nurse Practitioner

## 2021-10-12 DIAGNOSIS — E782 Mixed hyperlipidemia: Secondary | ICD-10-CM

## 2021-10-18 ENCOUNTER — Other Ambulatory Visit: Payer: Self-pay | Admitting: *Deleted

## 2021-10-18 MED ORDER — PANTOPRAZOLE SODIUM 40 MG PO TBEC: 40.0000 mg | DELAYED_RELEASE_TABLET | Freq: Two times a day (BID) | ORAL | 1 refills | Status: DC

## 2021-12-01 ENCOUNTER — Ambulatory Visit
Admission: RE | Admit: 2021-12-01 | Discharge: 2021-12-01 | Disposition: A | Discharge status: Home / Self Care | Payer: Medicare Other | Source: Ambulatory Visit | Attending: Nurse Practitioner | Admitting: Nurse Practitioner

## 2021-12-01 DIAGNOSIS — Z1231 Encounter for screening mammogram for malignant neoplasm of breast: Secondary | ICD-10-CM | POA: Diagnosis not present

## 2021-12-26 ENCOUNTER — Other Ambulatory Visit: Payer: Self-pay | Admitting: Nurse Practitioner

## 2022-01-10 ENCOUNTER — Ambulatory Visit
Admission: RE | Admit: 2022-01-10 | Discharge: 2022-01-10 | Disposition: A | Discharge status: Home / Self Care | Payer: Medicare Other | Source: Ambulatory Visit | Attending: Nurse Practitioner | Admitting: Nurse Practitioner

## 2022-01-10 DIAGNOSIS — M85852 Other specified disorders of bone density and structure, left thigh: Secondary | ICD-10-CM | POA: Diagnosis not present

## 2022-01-10 DIAGNOSIS — M810 Age-related osteoporosis without current pathological fracture: Secondary | ICD-10-CM | POA: Diagnosis not present

## 2022-01-10 DIAGNOSIS — E2839 Other primary ovarian failure: Secondary | ICD-10-CM

## 2022-01-10 DIAGNOSIS — Z780 Asymptomatic menopausal state: Secondary | ICD-10-CM | POA: Diagnosis not present

## 2022-01-27 DIAGNOSIS — H5213 Myopia, bilateral: Secondary | ICD-10-CM | POA: Diagnosis not present

## 2022-01-27 DIAGNOSIS — H25813 Combined forms of age-related cataract, bilateral: Secondary | ICD-10-CM | POA: Diagnosis not present

## 2022-01-27 DIAGNOSIS — H524 Presbyopia: Secondary | ICD-10-CM | POA: Diagnosis not present

## 2022-02-21 ENCOUNTER — Encounter: Payer: Self-pay | Admitting: Nurse Practitioner

## 2022-02-21 ENCOUNTER — Ambulatory Visit (INDEPENDENT_AMBULATORY_CARE_PROVIDER_SITE_OTHER): Payer: Medicare Other | Admitting: Nurse Practitioner

## 2022-02-21 VITALS — BP 124/80 | HR 77 | Temp 97.3°F | Ht 62.0 in | Wt 117.0 lb

## 2022-02-21 DIAGNOSIS — E034 Atrophy of thyroid (acquired): Secondary | ICD-10-CM | POA: Diagnosis not present

## 2022-02-21 DIAGNOSIS — I10 Essential (primary) hypertension: Secondary | ICD-10-CM | POA: Diagnosis not present

## 2022-02-21 DIAGNOSIS — Z23 Encounter for immunization: Secondary | ICD-10-CM

## 2022-02-21 DIAGNOSIS — K219 Gastro-esophageal reflux disease without esophagitis: Secondary | ICD-10-CM | POA: Diagnosis not present

## 2022-02-21 DIAGNOSIS — M810 Age-related osteoporosis without current pathological fracture: Secondary | ICD-10-CM

## 2022-02-21 DIAGNOSIS — N183 Chronic kidney disease, stage 3 unspecified: Secondary | ICD-10-CM | POA: Diagnosis not present

## 2022-02-21 DIAGNOSIS — E782 Mixed hyperlipidemia: Secondary | ICD-10-CM

## 2022-02-21 NOTE — Patient Instructions (Signed)
Miralax to take daily for constipation.

## 2022-02-21 NOTE — Progress Notes (Signed)
Careteam: Patient Care Team: Lauree Chandler, NP as PCP - General (Geriatric Medicine)  PLACE OF SERVICE:  [place] Directive information    No Known Allergies  Chief Complaint  Patient presents with   Medical Management of Chronic Issues    6 month follow-up and flu vaccine. Discuss need for covid boosters and shingrix or post pone if patient refuses. Patient unsure protonix is working but wants to cut back to one a day.  Trouble with abdominal pain.     HPI: Patient is a 81 y.o. female for routine follow up. Reports she is doing well.  Has no complaints today. Feels like she is constipation at times. Has taken some OTC but not helpful.   GERD- controlled, would like cut back on protonix to daily, continues to take pepcid at bedtime  Htn- well controlled.   -continues zetia for cholesterol   Mood is good.    Review of Systems:  Review of Systems  Constitutional:  Negative for chills, fever and weight loss.  HENT:  Negative for tinnitus.   Respiratory:  Negative for cough, sputum production and shortness of breath.   Cardiovascular:  Negative for chest pain, palpitations and leg swelling.  Gastrointestinal:  Negative for abdominal pain, constipation, diarrhea and heartburn.  Genitourinary:  Negative for dysuria, frequency and urgency.  Musculoskeletal:  Negative for back pain, falls, joint pain and myalgias.  Skin: Negative.   Neurological:  Negative for dizziness and headaches.  Psychiatric/Behavioral:  Negative for depression and memory loss. The patient does not have insomnia.     Past Medical History:  Diagnosis Date   Conjunctivitis unspecified    Disorder of bone and cartilage, unspecified    GERD (gastroesophageal reflux disease)    Hepatic lesion    History of gallstones    Hyperlipidemia    Hypertension    Hypothyroidism    Kidney stone    Lacrimal disorder    Myalgia and myositis, unspecified    Osteoarthrosis, unspecified  whether generalized or localized, unspecified site    Osteopenia    Osteoporosis    Osteoporosis, unspecified    Other and unspecified hyperlipidemia    Other dysphagia    Pain in joint, forearm    Reflux esophagitis    Unspecified disorder of kidney and ureter    Unspecified essential hypertension    Unspecified hypothyroidism    Unspecified vitamin D deficiency    Vitamin D deficiency    Vitiligo    Vitiligo    Past Surgical History:  Procedure Laterality Date   COLONOSCOPY     LEG SURGERY     right   TUBAL LIGATION  1973   Social History:   reports that she has never smoked. She has never used smokeless tobacco. She reports current alcohol use. She reports that she does not use drugs.  Family History  Problem Relation Age of Onset   Colon cancer Mother 30   Diabetes Mother    Diabetes Sister    Colon cancer Nephew    Esophageal cancer Neg Hx    Rectal cancer Neg Hx    Stomach cancer Neg Hx     Medications: Patient's Medications  New Prescriptions   No medications on file  Previous Medications   AMLODIPINE (NORVASC) 10 MG TABLET    TAKE 1 TABLET BY MOUTH DAILY   CALCIUM CARB-CHOLECALCIFEROL 600-800 MG-UNIT TABS    Take 1 tablet by mouth 2 (two) times daily.   EZETIMIBE (ZETIA)  10 MG TABLET    TAKE 1 TABLET BY MOUTH DAILY   FAMOTIDINE (PEPCID) 40 MG TABLET    Take 1 tablet (40 mg total) by mouth at bedtime.   LEVOTHYROXINE (SYNTHROID) 75 MCG TABLET    TAKE 1 TABLET BY MOUTH ONCE DAILY FOR THYROID   PANTOPRAZOLE (PROTONIX) 40 MG TABLET    Take 1 tablet (40 mg total) by mouth 2 (two) times daily before a meal.  Modified Medications   No medications on file  Discontinued Medications   No medications on file    Physical Exam:  Vitals:   02/21/22 1009  BP: 124/80  Pulse: 77  Temp: (!) 97.3 F (36.3 C)  TempSrc: Temporal  SpO2: 99%  Weight: 117 lb (53.1 kg)  Height: _0  (1.575 m)   Body mass index is 21.4 kg/m. Wt Readings from Last 3 Encounters:   02/21/22 117 lb (53.1 kg)  08/16/21 119 lb (54 kg)  06/29/21 115 lb 3.2 oz (52.3 kg)    Physical Exam Constitutional:      General: She is not in acute distress.    Appearance: She is well-developed. She is not diaphoretic.  HENT:     Head: Normocephalic and atraumatic.     Mouth/Throat:     Pharynx: No oropharyngeal exudate.  Eyes:     Conjunctiva/sclera: Conjunctivae normal.     Pupils: Pupils are equal, round, and reactive to light.  Cardiovascular:     Rate and Rhythm: Normal rate and regular rhythm.     Heart sounds: Normal heart sounds.  Pulmonary:     Effort: Pulmonary effort is normal.     Breath sounds: Normal breath sounds.  Abdominal:     General: Bowel sounds are normal.     Palpations: Abdomen is soft.  Musculoskeletal:     Cervical back: Normal range of motion and neck supple.     Right lower leg: No edema.     Left lower leg: No edema.  Skin:    General: Skin is warm and dry.  Neurological:     Mental Status: She is alert.  Psychiatric:        Mood and Affect: Mood normal.     Labs reviewed: Basic Metabolic Panel: Recent Labs    04/12/21 0827 08/16/21 1023  NA 140 141  K 3.8 4.0  CL 104 105  CO2 25 29  GLUCOSE 88 93  BUN 27* 22  CREATININE 0.86 0.94  CALCIUM 10.0 9.4  TSH 6.26* 2.31   Liver Function Tests: Recent Labs    04/12/21 0827 08/16/21 1023  AST 19 16  ALT 8 6  BILITOT 0.5 0.4  PROT 7.6 7.4   No results for input(s): "LIPASE", "AMYLASE" in the last 8760 hours. No results for input(s): "AMMONIA" in the last 8760 hours. CBC: Recent Labs    04/12/21 0827  WBC 5.5  NEUTROABS 2,618  HGB 15.5  HCT 47.9*  MCV 86.2  PLT 228   Lipid Panel: Recent Labs    04/12/21 0827 08/16/21 1023  CHOL 231* 186  HDL 67 75  LDLCALC 143* 95  TRIG 99 69  CHOLHDL 3.4 2.5   TSH: Recent Labs    04/12/21 0827 08/16/21 1023  TSH 6.26* 2.31   A1C: No results found for: "HGBA1C"   Assessment/Plan 1. Mixed  hyperlipidemia -continues on zetia with dietary modifications - Flu Vaccine QUAD High Dose(Fluad) - Lipid panel - CMP with eGFR([facility])  2. Essential hypertension, benign -Blood pressure well  controlled, goal bp <140/90 Continue current medications and dietary modifications follow metabolic panel - Flu Vaccine QUAD High Dose(Fluad) - CBC with Differential/Platelet - CMP with eGFR([facility])  3. Gastroesophageal reflux disease, unspecified whether esophagitis present -controlled, continues on protonix twice daily and pepcid.  - Flu Vaccine QUAD High Dose(Fluad)  4. Hypothyroidism due to acquired atrophy of thyroid -continues on synthroid 75 mcg - Flu Vaccine QUAD High Dose(Fluad)  5. Age-related osteoporosis without current pathological fracture -Recommended to take calcium 600 mg twice daily with Vitamin D 2000 units daily and weight bearing activity 30 mins/5 days a week - Flu Vaccine QUAD High Dose(Fluad)  6. Stage 3 chronic kidney disease, unspecified whether stage 3a or 3b CKD (HCC) -Chronic and stable Encourage proper hydration Follow metabolic panel Avoid nephrotoxic meds (NSAIDS) - Flu Vaccine QUAD High Dose(Fluad)  7. Need for vaccination - Flu Vaccine QUAD High Dose(Fluad)  Return in about 6 months (around 08/22/2022). Carlos American. [place], [facility] [phone]

## 2022-02-22 LAB — COMPLETE METABOLIC PANEL WITH GFR
AG Ratio: 1.5 (calc) (ref 1.0–2.5)
ALT: 4 U/L — ABNORMAL LOW (ref 6–29)
AST: 15 U/L (ref 10–35)
Albumin: 4.8 g/dL (ref 3.6–5.1)
Alkaline phosphatase (APISO): 42 U/L (ref 37–153)
BUN: 23 mg/dL (ref 7–25)
CO2: 23 mmol/L (ref 20–32)
Calcium: 10.1 mg/dL (ref 8.6–10.4)
Chloride: 104 mmol/L (ref 98–110)
Creat: 0.91 mg/dL (ref 0.60–0.95)
Globulin: 3.1 g/dL (calc) (ref 1.9–3.7)
Glucose, Bld: 91 mg/dL (ref 65–139)
Potassium: 3.8 mmol/L (ref 3.5–5.3)
Sodium: 141 mmol/L (ref 135–146)
Total Bilirubin: 0.4 mg/dL (ref 0.2–1.2)
Total Protein: 7.9 g/dL (ref 6.1–8.1)
eGFR: 64 mL/min/{1.73_m2} (ref >=60)

## 2022-02-22 LAB — LIPID PANEL
Cholesterol: 195 mg/dL (ref <200)
HDL: 75 mg/dL (ref >=50)
LDL Cholesterol (Calc): 102 mg/dL (calc) — ABNORMAL HIGH
Non-HDL Cholesterol (Calc): 120 mg/dL (calc) (ref <130)
Total CHOL/HDL Ratio: 2.6 (calc) (ref <5.0)
Triglycerides: 90 mg/dL (ref <150)

## 2022-02-22 LAB — CBC WITH DIFFERENTIAL/PLATELET
Absolute Monocytes: 392 cells/uL (ref 200–950)
Basophils Absolute: 18 cells/uL (ref 0–200)
Basophils Relative: 0.4 %
Eosinophils Absolute: 72 cells/uL (ref 15–500)
Eosinophils Relative: 1.6 %
HCT: 44.7 % (ref 35.0–45.0)
Hemoglobin: 14.6 g/dL (ref 11.7–15.5)
Lymphs Abs: 1521 cells/uL (ref 850–3900)
MCH: 27.7 pg (ref 27.0–33.0)
MCHC: 32.7 g/dL (ref 32.0–36.0)
MCV: 84.8 fL (ref 80.0–100.0)
MPV: 10.2 fL (ref 7.5–12.5)
Monocytes Relative: 8.7 %
Neutro Abs: 2498 cells/uL (ref 1500–7800)
Neutrophils Relative %: 55.5 %
Platelets: 203 10*3/uL (ref 140–400)
RBC: 5.27 10*6/uL — ABNORMAL HIGH (ref 3.80–5.10)
RDW: 12.7 % (ref 11.0–15.0)
Total Lymphocyte: 33.8 %
WBC: 4.5 10*3/uL (ref 3.8–10.8)

## 2022-03-12 ENCOUNTER — Other Ambulatory Visit: Payer: Self-pay | Admitting: Nurse Practitioner

## 2022-04-06 ENCOUNTER — Encounter: Payer: Self-pay | Admitting: Family

## 2022-04-06 ENCOUNTER — Ambulatory Visit (INDEPENDENT_AMBULATORY_CARE_PROVIDER_SITE_OTHER): Payer: Medicare Other | Admitting: Family

## 2022-04-06 VITALS — BP 134/82 | HR 74 | Temp 97.4°F | Resp 16 | Ht 62.0 in | Wt 121.2 lb

## 2022-04-06 DIAGNOSIS — K5901 Slow transit constipation: Secondary | ICD-10-CM | POA: Diagnosis not present

## 2022-04-06 MED ORDER — POLYETHYLENE GLYCOL 3350 17 GM/SCOOP PO POWD: 17.0000 g | Freq: Every day | ORAL | 1 refills | Status: DC

## 2022-04-06 NOTE — Progress Notes (Signed)
Provider: Goldman Birchall FNP-C  Lauree Chandler, NP  Patient Care Team: Lauree Chandler, NP as PCP - General (Geriatric Medicine)  Extended Emergency Contact Information Primary Emergency Contact: Andrew,Geraldine Address: [address]          [place], [place] Phone: [phone] Relation: Sister Secondary Emergency Contact: [facility] Address: [address]           [place], [place] [address] John[place]te [place] of [place] Phone: [phone] Relation: Son  Code Status:  DNR Goals of care: Advanced Directive information    04/06/2022    9:16 AM  Advanced Directives  Does Patient Have a Medical Advance Directive? Yes  Type of Advance Directive [place]  Does patient want to make changes to medical advance directive? No - Patient declined  Copy of [place] in Chart? No - copy requested     Chief Complaint  Patient presents with   Acute Visit    Patient complains of having stomach issues.    HPI:  Pt is a 82 y.o. female seen today for an acute visit for evaluation of constipation for several days.Had small round bowel movement today.Has had lots of gas took gas medication.Has Dulcolax which she took today.Eats oatmeal,cornflakes,eggs for breakfast.lunch has [place] sandwich sometimes [place].does not eat a whole lot.Does not eat veggies or fruits in diet. Does drink water and coffee. She denies any abdominal pain,bloating,nausea or vomiting.   Past Medical History:  Diagnosis Date   Conjunctivitis unspecified    Disorder of bone and cartilage, unspecified    GERD (gastroesophageal reflux disease)    Hepatic lesion    History of gallstones    Hyperlipidemia    Hypertension    Hypothyroidism    Kidney stone    Lacrimal disorder    Myalgia and myositis, unspecified    Osteoarthrosis, unspecified whether generalized or localized, unspecified site    Osteopenia    Osteoporosis     Osteoporosis, unspecified    Other and unspecified hyperlipidemia    Other dysphagia    Pain in joint, forearm    Reflux esophagitis    Unspecified disorder of kidney and ureter    Unspecified essential hypertension    Unspecified hypothyroidism    Unspecified vitamin D deficiency    Vitamin D deficiency    Vitiligo    Vitiligo    Past Surgical History:  Procedure Laterality Date   COLONOSCOPY     LEG SURGERY     right   TUBAL LIGATION  1973    No Known Allergies  Outpatient Encounter Medications as of 04/06/2022  Medication Sig   amLODipine (NORVASC) 10 MG tablet TAKE 1 TABLET BY MOUTH DAILY   Bisacodyl (LAXATIVE PO) Take 1 tablet by mouth as needed.   Calcium Carb-Cholecalciferol 600-800 MG-UNIT TABS Take 1 tablet by mouth 2 (two) times daily.   ezetimibe (ZETIA) 10 MG tablet TAKE 1 TABLET BY MOUTH DAILY   famotidine (PEPCID) 40 MG tablet Take 1 tablet (40 mg total) by mouth at bedtime.   levothyroxine (SYNTHROID) 75 MCG tablet TAKE 1 TABLET BY MOUTH ONCE  DAILY FOR THYROID   pantoprazole (PROTONIX) 40 MG tablet Take 1 tablet (40 mg total) by mouth 2 (two) times daily before a meal.   No facility-administered encounter medications on file as of 04/06/2022.    Review of Systems  Constitutional:  Negative for appetite change, chills, fatigue and fever.  Respiratory:  Negative for cough,  chest tightness, shortness of breath and wheezing.   Cardiovascular:  Negative for chest pain, palpitations and leg swelling.  Gastrointestinal:  Positive for constipation. Negative for abdominal distention, abdominal pain, blood in stool, diarrhea, nausea and vomiting.  Musculoskeletal:  Negative for arthralgias, back pain, gait problem, joint swelling, myalgias, neck pain and neck stiffness.    Immunization History  Administered Date(s) Administered   Fluad Quad(high Dose 65+) 12/05/2018, 01/10/2020, 01/04/2021, 02/21/2022   Influenza Whole 12/03/2012   Influenza, High Dose Seasonal PF  11/25/2016   Influenza,inj,Quad PF,6+ Mos 01/29/2014, 01/09/2015, 01/03/2018   Influenza-Unspecified 01/20/2016   Moderna Sars-Covid-2 Vaccination 06/14/2019, 07/15/2019   Pneumococcal Conjugate-13 01/29/2014   Pneumococcal Polysaccharide-23 11/20/2012   Pertinent  Health Maintenance Due  Topic Date Due   INFLUENZA VACCINE  Completed   DEXA SCAN  Completed      04/12/2021    8:05 AM 07/20/2021    3:31 PM 08/16/2021   10:37 AM 02/21/2022   10:15 AM 04/06/2022    9:15 AM  Fall Risk  Falls in the past year? 0 0 0 0 1  Was there an injury with Fall? 0 0 0  0  Fall Risk Category Calculator 0 0 0  1  Fall Risk Category Low Low Low  Low  Patient Fall Risk Level Low fall risk Low fall risk Low fall risk  Low fall risk  Patient at Risk for Falls Due to No Fall Risks No Fall Risks No Fall Risks  History of fall(s);Impaired balance/gait;Impaired mobility  Fall risk Follow up Falls evaluation completed Falls evaluation completed Falls evaluation completed  Falls evaluation completed;Education provided;Falls prevention discussed   Functional Status Survey:    Vitals:   04/06/22 0913  BP: 134/82  Pulse: 74  Resp: 16  Temp: (!) 97.4 F (36.3 C)  SpO2: 98%  Weight: 121 lb 3.2 oz (55 kg)  Height: '5\' 2"'$  (1.575 m)   Body mass index is 22.17 kg/m. Physical Exam Vitals reviewed.  Constitutional:      General: She is not in acute distress.    Appearance: Normal appearance. She is normal weight. She is not ill-appearing or diaphoretic.  HENT:     Head: Normocephalic.  Eyes:     General: No scleral icterus.       Right eye: No discharge.        Left eye: No discharge.     Conjunctiva/sclera: Conjunctivae normal.     Pupils: Pupils are equal, round, and reactive to light.  Cardiovascular:     Rate and Rhythm: Normal rate and regular rhythm.     Pulses: Normal pulses.     Heart sounds: Normal heart sounds. No murmur heard.    No friction rub. No gallop.  Pulmonary:     Effort:  Pulmonary effort is normal. No respiratory distress.     Breath sounds: Normal breath sounds. No wheezing, rhonchi or rales.  Chest:     Chest wall: No tenderness.  Abdominal:     General: Bowel sounds are normal. There is no distension.     Palpations: Abdomen is soft. There is no mass.     Tenderness: There is no abdominal tenderness. There is no right CVA tenderness, left CVA tenderness, guarding or rebound.  Neurological:     Mental Status: She is alert and oriented to person, place, and time.     Gait: Gait normal.  Psychiatric:        Mood and Affect: Mood normal.  Speech: Speech normal.        Behavior: Behavior normal.     Labs reviewed: Recent Labs    04/12/21 0827 08/16/21 1023 02/21/22 1051  NA 140 141 141  K 3.8 4.0 3.8  CL 104 105 104  CO2 '25 29 23  '$ GLUCOSE 88 93 91  BUN 27* 22 23  CREATININE 0.86 0.94 0.91  CALCIUM 10.0 9.4 10.1   Recent Labs    04/12/21 0827 08/16/21 1023 02/21/22 1051  AST '19 16 15  '$ ALT 8 6 4*  BILITOT 0.5 0.4 0.4  PROT 7.6 7.4 7.9   Recent Labs    04/12/21 0827 02/21/22 1051  WBC 5.5 4.5  NEUTROABS 2,618 2,498  HGB 15.5 14.6  HCT 47.9* 44.7  MCV 86.2 84.8  PLT 228 203   Lab Results  Component Value Date   TSH 2.31 08/16/2021   No results found for: "HGBA1C" Lab Results  Component Value Date   CHOL 195 02/21/2022   HDL 75 02/21/2022   LDLCALC 102 (H) 02/21/2022   TRIG 90 02/21/2022   CHOLHDL 2.6 02/21/2022    Significant Diagnostic Results in last 30 days:  No results found.  Assessment/Plan  Slow transit constipation Abdomen soft,non-distended ,non-tender to palpation with x 4 bowel sound present.  - encouraged to increase fiber in diet  - increase water intake to 6-8 glasses of water daily and exercise as tolerated  - Start on miralax 5 days samples given. - Additional educational information provided on AVS - Advised to notify provider if symptoms worsen or fail to improve. - polyethylene glycol  powder (GLYCOLAX/MIRALAX) 17 GM/SCOOP powder; Take 17 g by mouth daily. Hold for loose stool  Dispense: 3350 g; Refill: 1  Family/ staff Communication: Reviewed plan of care with patient verbalized understanding   Labs/tests ordered: None   Next Appointment: Return if symptoms worsen or fail to improve.    Sandrea Hughs, NP

## 2022-04-06 NOTE — Patient Instructions (Signed)
-   encouraged to increase fiber in diet  - increase water intake to 6-8 glasses of water daily and exercise as tolerated  - start on miralax    Constipation, Adult Constipation is when a person has fewer than three bowel movements in a week, has difficulty having a bowel movement, or has stools (feces) that are dry, hard, or larger than normal. Constipation may be caused by an underlying condition. It may become worse with age if a person takes certain medicines and does not take in enough fluids. Follow these instructions at home: Eating and drinking  Eat foods that have a lot of fiber, such as beans, whole grains, and fresh fruits and vegetables. Limit foods that are low in fiber and high in fat and processed sugars, such as fried or sweet foods. These include french fries, hamburgers, cookies, candies, and soda. Drink enough fluid to keep your urine pale yellow. General instructions Exercise regularly or as told by your health care provider. Try to do 150 minutes of moderate exercise each week. Use the bathroom when you have the urge to go. Do not hold it in. Take over-the-counter and prescription medicines only as told by your health care provider. This includes any fiber supplements. During bowel movements: Practice deep breathing while relaxing the lower abdomen. Practice pelvic floor relaxation. Watch your condition for any changes. Let your health care provider know about them. Keep all follow-up visits as told by your health care provider. This is important. Contact a health care provider if: You have pain that gets worse. You have a fever. You do not have a bowel movement after 4 days. You vomit. You are not hungry or you lose weight. You are bleeding from the opening between the buttocks (anus). You have thin, pencil-like stools. Get help right away if: You have a fever and your symptoms suddenly get worse. You leak stool or have blood in your stool. Your abdomen is  bloated. You have severe pain in your abdomen. You feel dizzy or you faint. Summary Constipation is when a person has fewer than three bowel movements in a week, has difficulty having a bowel movement, or has stools (feces) that are dry, hard, or larger than normal. Eat foods that have a lot of fiber, such as beans, whole grains, and fresh fruits and vegetables. Drink enough fluid to keep your urine pale yellow. Take over-the-counter and prescription medicines only as told by your health care provider. This includes any fiber supplements. This information is not intended to replace advice given to you by your health care provider. Make sure you discuss any questions you have with your health care provider. Document Revised: 02/06/2019 Document Reviewed: 02/06/2019 Elsevier Patient Education  [place].

## 2022-04-11 ENCOUNTER — Telehealth: Payer: Self-pay

## 2022-04-11 NOTE — Telephone Encounter (Signed)
Spoke with patient and she verbalized her understanding.

## 2022-04-11 NOTE — Telephone Encounter (Signed)
May continue with Miralax every other day for constipation but hold for loose stool.

## 2022-04-11 NOTE — Telephone Encounter (Signed)
Patient called and wants to know if she should continue to take the miralax. She was given samples. Patient states that the medication has seemed to help some. She has had loose stools,but seems if bowels are not moving as they should.  Message routed to Marlowe Sax, NP

## 2022-05-02 ENCOUNTER — Telehealth: Payer: Medicare Other

## 2022-05-02 NOTE — Telephone Encounter (Signed)
Patient was advised and verbalized understanding. 

## 2022-05-02 NOTE — Telephone Encounter (Signed)
Patient called and reports the Miralax is not helping her with the constipation and would like to know if theres another medication she can take to help her bowels to move.

## 2022-05-02 NOTE — Telephone Encounter (Signed)
Recommend Bisacodyl 5 mg tablet one by mouth x 1 dose.may repeat x 1 dose if still no bowel movement.

## 2022-06-12 ENCOUNTER — Other Ambulatory Visit: Payer: Self-pay | Admitting: Nurse Practitioner

## 2022-06-13 ENCOUNTER — Other Ambulatory Visit: Payer: Self-pay | Admitting: Nurse Practitioner

## 2022-06-13 DIAGNOSIS — I10 Essential (primary) hypertension: Secondary | ICD-10-CM

## 2022-06-13 DIAGNOSIS — E782 Mixed hyperlipidemia: Secondary | ICD-10-CM

## 2022-06-13 NOTE — Telephone Encounter (Signed)
Pharmacy requested refills.  Pended Rx's and sent to [facility] for approval due to HIGH ALERT Warning.  

## 2022-07-01 ENCOUNTER — Ambulatory Visit: Payer: Self-pay | Admitting: *Deleted

## 2022-07-01 NOTE — Telephone Encounter (Signed)
Summary: shake in hand/dizziness   Patient is having shakes in the hands (two months) and dizziness off and on         Reason for Disposition  [1] Numbness or tingling in one or both feet AND [2] is a chronic symptom (recurrent or ongoing AND present > 4 weeks)  Answer Assessment - Initial Assessment Questions 1. SYMPTOM: "What is the main symptom you are concerned about?" (e.g., weakness, numbness)     Shaking in hands- worse- numbness 2. ONSET: "When did this start?" (minutes, hours, days; while sleeping)     2 months but worse 2 weeks 3. LAST NORMAL: "When was the last time you (the patient) were normal (no symptoms)?"     2 months ago 4. PATTERN "Does this come and go, or has it been constant since it started?"  "Is it present now?"     Comes and goes- lately lasting longer and more frequent 5. CARDIAC SYMPTOMS: "Have you had any of the following symptoms: chest pain, difficulty breathing, palpitations?"     no 6. NEUROLOGIC SYMPTOMS: "Have you had any of the following symptoms: headache, dizziness, vision loss, double vision, changes in speech, unsteady on your feet?"     Balance seems off, sometimes dizziness 7. OTHER SYMPTOMS: "Do you have any other symptoms?"     no  Protocols used: Neurologic Deficit-A-AH

## 2022-07-01 NOTE — Telephone Encounter (Signed)
  Chief Complaint: increased shaking of hands/arms bilateral, dizziness at times Symptoms: shaking when holding things seems worse Frequency: has been 2 months- but seems worse over last 2 weeks Pertinent Negatives: Patient denies chest pain, difficulty breathing, palpitations Disposition: [] ED /[x] Urgent Care (no appt availability in office) / [] Appointment(In office/virtual)/ []  [facility]/ [] Home Care/ [] Refused Recommended Disposition /[] [facility]/ []  Follow-up with PCP Additional Notes: Patient advised to contact her PCP for appointment- if she should get worse over the week end please be seen at [facility].

## 2022-07-25 NOTE — Telephone Encounter (Signed)
Error

## 2022-07-26 ENCOUNTER — Encounter: Payer: Medicare Other | Admitting: Nurse Practitioner

## 2022-07-29 ENCOUNTER — Encounter: Payer: Self-pay | Admitting: Nurse Practitioner

## 2022-07-29 ENCOUNTER — Ambulatory Visit (INDEPENDENT_AMBULATORY_CARE_PROVIDER_SITE_OTHER): Payer: Medicare Other | Admitting: Nurse Practitioner

## 2022-07-29 VITALS — BP 116/82 | HR 63 | Ht 62.0 in | Wt 116.6 lb

## 2022-07-29 DIAGNOSIS — Z0000 Encounter for general adult medical examination without abnormal findings: Secondary | ICD-10-CM | POA: Diagnosis not present

## 2022-07-29 NOTE — Patient Instructions (Addendum)
  Ms. Turcott , Thank you for taking time to come for your Medicare Wellness Visit. I appreciate your ongoing commitment to your health goals. Please review the following plan we discussed and let me know if I can assist you in the future.   These are the goals we discussed:  Goals      Maintain Lifestyle     Pt will maintain lifestyle.        This is a list of the screening recommended for you and due dates:  Health Maintenance  Topic Date Due   DTaP/Tdap/Td vaccine (1 - Tdap) Never done   Zoster (Shingles) Vaccine (1 of 2) Never done   COVID-19 Vaccine (3 - Moderna risk series) 08/12/2019   Flu Shot  11/03/2022   Medicare Annual Wellness Visit  07/29/2023   Pneumonia Vaccine  Completed   DEXA scan (bone density measurement)  Completed   HPV Vaccine  Aged Out

## 2022-07-29 NOTE — Progress Notes (Signed)
Subjective:   Tina Roberts is a 82 y.o. female who presents for Medicare Annual (Subsequent) preventive examination.  Review of Systems     Cardiac Risk Factors include: hypertension;dyslipidemia;advanced age (>74men, >73 women);sedentary lifestyle     Objective:    Today's Vitals   07/29/22 1042  BP: 116/82  Pulse: 63  SpO2: 98%  Weight: 116 lb 9.6 oz (52.9 kg)  Height: 5\' 2"  (1.575 m)   Body mass index is 21.33 kg/m.     07/29/2022   10:49 AM 04/06/2022    9:16 AM 08/16/2021    8:43 AM 07/20/2021    3:33 PM 04/12/2021    8:06 AM 01/04/2021    8:28 AM 07/15/2020   12:37 PM  Advanced Directives  Does Patient Have a Medical Advance Directive? Yes Yes Yes Yes Yes Yes Yes  Type of Estate agent of [place];Living will Healthcare Power of [employer] of [place];Living will Healthcare Power of [place];Living will Healthcare Power of [employer] Power of [employer] Power of [place];Living will  Does patient want to make changes to medical advance directive? No - Patient declined No - Patient declined No - Patient declined No - Patient declined No - Patient declined No - Patient declined No - Patient declined  Copy of Healthcare Power of Attorney in Chart? Yes - validated most recent copy scanned in chart (See row information) No - copy requested Yes - validated most recent copy scanned in chart (See row information) Yes - validated most recent copy scanned in chart (See row information) Yes - validated most recent copy scanned in chart (See row information) Yes - validated most recent copy scanned in chart (See row information) Yes - validated most recent copy scanned in chart (See row information)    Current Medications (verified) Outpatient Encounter Medications as of 07/29/2022  Medication Sig   amLODipine (NORVASC) 10 MG tablet TAKE 1 TABLET BY MOUTH DAILY   Bisacodyl (LAXATIVE PO) Take 1 tablet by mouth as needed.   Calcium  Carb-Cholecalciferol 600-800 MG-UNIT TABS Take 1 tablet by mouth 2 (two) times daily.   ezetimibe (ZETIA) 10 MG tablet TAKE 1 TABLET BY MOUTH DAILY   famotidine (PEPCID) 40 MG tablet TAKE 1 TABLET BY MOUTH AT  BEDTIME   levothyroxine (SYNTHROID) 75 MCG tablet TAKE 1 TABLET BY MOUTH ONCE  DAILY FOR THYROID   pantoprazole (PROTONIX) 40 MG tablet TAKE 1 TABLET BY MOUTH TWICE  DAILY BEFORE A MEAL   polyethylene glycol powder (GLYCOLAX/MIRALAX) 17 GM/SCOOP powder Take 17 g by mouth daily. Hold for loose stool   No facility-administered encounter medications on file as of 07/29/2022.    Allergies (verified) Patient has no known allergies.   History: Past Medical History:  Diagnosis Date   Conjunctivitis unspecified    Disorder of bone and cartilage, unspecified    GERD (gastroesophageal reflux disease)    Hepatic lesion    History of gallstones    Hyperlipidemia    Hypertension    Hypothyroidism    Kidney stone    Lacrimal disorder    Myalgia and myositis, unspecified    Osteoarthrosis, unspecified whether generalized or localized, unspecified site    Osteopenia    Osteoporosis    Osteoporosis, unspecified    Other and unspecified hyperlipidemia    Other dysphagia    Pain in joint, forearm    Reflux esophagitis    Unspecified disorder of kidney and ureter    Unspecified essential hypertension  Unspecified hypothyroidism    Unspecified vitamin D deficiency    Vitamin D deficiency    Vitiligo    Vitiligo    Past Surgical History:  Procedure Laterality Date   COLONOSCOPY     LEG SURGERY     right   TUBAL LIGATION  1973   Family History  Problem Relation Age of Onset   Colon cancer Mother 29   Diabetes Mother    Diabetes Sister    Colon cancer Nephew    Esophageal cancer Neg Hx    Rectal cancer Neg Hx    Stomach cancer Neg Hx    Social History   Socioeconomic History   Marital status: Married    Spouse name: Not on file   Number of children: 4   Years of  education: Not on file   Highest education level: Not on file  Occupational History   Not on file  Tobacco Use   Smoking status: Never   Smokeless tobacco: Never  Vaping Use   Vaping Use: Never used  Substance and Sexual Activity   Alcohol use: Yes    Alcohol/week: 0.0 standard drinks of alcohol    Comment: occasional drink wine    Drug use: No   Sexual activity: Not Currently  Other Topics Concern   Not on file  Social History Narrative   Not on file   Social Determinants of Health   Financial Resource Strain: Low Risk  (12/01/2017)   Overall Financial Resource Strain (CARDIA)    Difficulty of Paying Living Expenses: Not hard at all  Food Insecurity: No Food Insecurity (12/01/2017)   Hunger Vital Sign    Worried About Running Out of Food in the Last Year: Never true    Ran Out of Food in the Last Year: Never true  Transportation Needs: No Transportation Needs (12/01/2017)   PRAPARE - Administrator, Civil Service (Medical): No    Lack of Transportation (Non-Medical): No  Physical Activity: Sufficiently Active (12/01/2017)   Exercise Vital Sign    Days of Exercise per Week: 3 days    Minutes of Exercise per Session: 90 min  Stress: No Stress Concern Present (12/01/2017)   [employer] of Occupational Health - Occupational Stress Questionnaire    Feeling of Stress : Only a little  Social Connections: Somewhat Isolated (12/01/2017)   Social Connection and Isolation Panel [NHANES]    Frequency of Communication with Friends and Family: More than three times a week    Frequency of Social Gatherings with Friends and Family: More than three times a week    Attends Religious Services: Never    Database administrator or Organizations: No    Attends Engineer, structural: Never    Marital Status: Married    Tobacco Counseling Counseling given: Not Answered   Clinical Intake:  Pre-visit preparation completed: Yes  Pain : No/denies pain     BMI -  recorded: 21.33 Nutritional Status: BMI of 19-24  Normal Nutritional Risks: None Diabetes: No  How often do you need to have someone help you when you read instructions, pamphlets, or other written materials from your doctor or pharmacy?: 1 - Never What is the last grade level you completed in school?: 10th grade  Diabetic?no  Interpreter Needed?: No  Information entered by :: Porsha McClurkin,CMA   Activities of Daily Living    07/29/2022   10:51 AM  In your present state of health, do you have any difficulty performing  the following activities:  Hearing? 1  Vision? 0  Difficulty concentrating or making decisions? 0  Walking or climbing stairs? 0  Dressing or bathing? 0  Doing errands, shopping? 0  Preparing Food and eating ? N  Using the Toilet? N  In the past six months, have you accidently leaked urine? N  Do you have problems with loss of bowel control? N  Managing your Medications? N  Managing your Finances? N  Housekeeping or managing your Housekeeping? N    Patient Care Team: Sharon Seller, NP as PCP - General (Geriatric Medicine)  Indicate any recent Medical Services you may have received from other than [facility] providers in the past year (date may be approximate).     Assessment:   This is a routine wellness examination for Carmie.  Hearing/Vision screen Hearing Screening - Comments:: Concerns with hearing/ no hearing aids Vision Screening - Comments:: No vision concerns  Dietary issues and exercise activities discussed: Current Exercise Habits: Home exercise routine, Type of exercise: walking, Time (Minutes): 30, Frequency (Times/Week): 4, Weekly Exercise (Minutes/Week): 120, Intensity: Mild, Exercise limited by: None identified   Goals Addressed   None    Depression Screen    07/29/2022   10:51 AM 02/21/2022   10:15 AM 07/20/2021    3:29 PM 04/12/2021    8:06 AM 07/14/2020   11:07 AM 01/20/2020   10:10 AM 01/10/2020    9:33 AM  PHQ 2/9 Scores   PHQ - 2 Score 0 0 0 0 0 0 0    Fall Risk    07/29/2022   10:50 AM 04/06/2022    9:15 AM 02/21/2022   10:15 AM 08/16/2021   10:37 AM 07/20/2021    3:31 PM  Fall Risk   Falls in the past year? 0 1 0 0 0  Number falls in past yr: 0 0  0 0  Injury with Fall? 0 0  0 0  Risk for fall due to : Other (Comment) History of fall(s);Impaired balance/gait;Impaired mobility  No Fall Risks No Fall Risks  Follow up Falls evaluation completed Falls evaluation completed;Education provided;Falls prevention discussed  Falls evaluation completed Falls evaluation completed    FALL RISK PREVENTION PERTAINING TO THE HOME:  Any stairs in or around the home? no If so, are there any without handrails? Na  Home free of loose throw rugs in walkways, pet beds, electrical cords, etc? Yes  Adequate lighting in your home to reduce risk of falls? Yes   ASSISTIVE DEVICES UTILIZED TO PREVENT FALLS:  Life alert? No  Use of a cane, walker or w/c? No  Grab bars in the bathroom? No  Shower chair or bench in shower? No  Elevated toilet seat or a handicapped toilet? No   TIMED UP AND GO:  Was the test performed? Yes .    Cognitive Function:    07/29/2022   10:52 AM 12/05/2018   10:21 AM 12/01/2017   10:15 AM 11/25/2016   10:19 AM 03/16/2016    8:50 AM  MMSE - Mini Mental State Exam  Orientation to time 5 5 5 5 5   Orientation to Place 5 5 5 5 5   Registration 3 3 3 3 3   Attention/ Calculation 0 3 0 0 0  Recall 3 2 3 1 3   Language- name 2 objects 2 2 2 2 2   Language- repeat 1 1 1 1 1   Language- follow 3 step command 3 2 3 3 3   Language- follow 3 step command-comments  Folded paper x 2     Language- read & follow direction 1 0 1 1 1   Language-read & follow direction-comments  read sentence, did not close eyes     Write a sentence 0 1 1 1 1   Copy design 0 0 0 0 0  Total score 23 24 24 22 24         07/20/2021    3:34 PM 07/15/2020   12:38 PM  6CIT Screen  What Year? 0 points 0 points  What month? 0 points  0 points  What time? 0 points 0 points  Count back from 20 4 points 4 points  Months in reverse 0 points 4 points  Repeat phrase 2 points 0 points  Total Score 6 points 8 points    Immunizations Immunization History  Administered Date(s) Administered   Fluad Quad(high Dose 65+) 12/05/2018, 01/10/2020, 01/04/2021, 02/21/2022   Influenza Whole 12/03/2012   Influenza, High Dose Seasonal PF 11/25/2016   Influenza,inj,Quad PF,6+ Mos 01/29/2014, 01/09/2015, 01/03/2018   Influenza-Unspecified 01/20/2016   Moderna Sars-Covid-2 Vaccination 06/14/2019, 07/15/2019   Pneumococcal Conjugate-13 01/29/2014   Pneumococcal Polysaccharide-23 11/20/2012    TDAP status: Due, Education has been provided regarding the importance of this vaccine. Advised may receive this vaccine at local pharmacy or Health Dept. Aware to provide a copy of the vaccination record if obtained from local pharmacy or Health Dept. Verbalized acceptance and understanding.  Flu Vaccine status: Up to date  Pneumococcal vaccine status: Up to date  Covid-19 vaccine status: Information provided on how to obtain vaccines.   Qualifies for Shingles Vaccine? Yes   Zostavax completed No   Shingrix Completed?: No.    Education has been provided regarding the importance of this vaccine. Patient has been advised to call insurance company to determine out of pocket expense if they have not yet received this vaccine. Advised may also receive vaccine at local pharmacy or Health Dept. Verbalized acceptance and understanding.  Screening Tests Health Maintenance  Topic Date Due   DTaP/Tdap/Td (1 - Tdap) Never done   Zoster Vaccines- Shingrix (1 of 2) Never done   COVID-19 Vaccine (3 - Moderna risk series) 08/12/2019   INFLUENZA VACCINE  11/03/2022   Medicare Annual Wellness (AWV)  07/29/2023   Pneumonia Vaccine 6+ Years old  Completed   DEXA SCAN  Completed   HPV VACCINES  Aged Out    Health Maintenance  Health Maintenance Due   Topic Date Due   DTaP/Tdap/Td (1 - Tdap) Never done   Zoster Vaccines- Shingrix (1 of 2) Never done   COVID-19 Vaccine (3 - Moderna risk series) 08/12/2019    Colorectal cancer screening: No longer required.   Mammogram status: No longer required due to age.  Osteoporosis noted  Lung Cancer Screening: (Low Dose CT Chest recommended if Age 69-80 years, 30 pack-year currently smoking OR have quit w/in 15years.) does not qualify.   Lung Cancer Screening Referral: na  Additional Screening:  Hepatitis C Screening: does not qualify; Completed na  Vision Screening: Recommended annual ophthalmology exams for early detection of glaucoma and other disorders of the eye. Is the patient up to date with their annual eye exam?  Yes  Who is the provider or what is the name of the office in which the patient attends annual eye exams? [facility] If pt is not established with a provider, would they like to be referred to a provider to establish care? No .   Dental Screening: Recommended annual dental exams for proper  oral hygiene  Community Resource Referral / Chronic Care Management: CRR required this visit?  No   CCM required this visit?  No      Plan:     I have personally reviewed and noted the following in the patient's chart:   Medical and social history Use of alcohol, tobacco or illicit drugs  Current medications and supplements including opioid prescriptions. Patient is not currently taking opioid prescriptions. Functional ability and status Nutritional status Physical activity Advanced directives List of other physicians Hospitalizations, surgeries, and ER visits in previous 12 months Vitals Screenings to include cognitive, depression, and falls Referrals and appointments  In addition, I have reviewed and discussed with patient certain preventive protocols, quality metrics, and best practice recommendations. A written personalized care plan for preventive services as well as  general preventive health recommendations were provided to patient.     Sharon Seller, NP   07/29/2022   Place of service: [facility]

## 2022-08-01 ENCOUNTER — Telehealth: Payer: Self-pay | Admitting: *Deleted

## 2022-08-01 NOTE — Telephone Encounter (Signed)
-----   Message from Sharon Seller, NP sent at 08/01/2022 12:42 PM EDT ----- Regarding: RE: osteoporosis yes ----- Message ----- From: Rice Walsh A, CMA Sent: 08/01/2022  12:27 PM EDT To: Sharon Seller, NP Subject: RE: osteoporosis                               This is a New Start for patient? ----- Message ----- From: Sharon Seller, NP Sent: 07/29/2022  11:08 AM EDT To: Beckey Downing Rexton Greulich, CMA Subject: osteoporosis                                   Can we get a pre auth for her on prolia. Due to osteoporosis

## 2022-08-03 NOTE — Telephone Encounter (Signed)
Verification Submitted to [employer].

## 2022-08-22 NOTE — Telephone Encounter (Addendum)
Prior Authorization submitted for Prolia through the [facility] Portal.  Went into Determination/Pending.  Request #: [id number]  Awaiting Determination.

## 2022-08-25 NOTE — Telephone Encounter (Signed)
Received fax from [facility] stating that Prolia Prior Authorization is APPROVED 08/22/2022-08/22/2023  Authorization #: [id number]

## 2022-09-01 NOTE — Telephone Encounter (Signed)
Received Prior Authorization from [employer] and through [employer].   Patient's Copay for Prolia is 20% and a $30 Administration fee. Total Copay for injection $333  Patient does NOT want to pay this and just wants to continue on her Calcium and Vitamin D supplements.   Patient has an appointment tomorrow 09/02/2022 with Shanda Bumps and will Discuss.

## 2022-09-01 NOTE — Telephone Encounter (Signed)
Okay thank you

## 2022-09-02 ENCOUNTER — Encounter: Payer: Self-pay | Admitting: Nurse Practitioner

## 2022-09-02 ENCOUNTER — Ambulatory Visit (INDEPENDENT_AMBULATORY_CARE_PROVIDER_SITE_OTHER): Payer: Medicare Other | Admitting: Nurse Practitioner

## 2022-09-02 VITALS — BP 118/74 | HR 82 | Temp 97.5°F | Ht 61.5 in | Wt 115.0 lb

## 2022-09-02 DIAGNOSIS — I10 Essential (primary) hypertension: Secondary | ICD-10-CM | POA: Diagnosis not present

## 2022-09-02 DIAGNOSIS — N183 Chronic kidney disease, stage 3 unspecified: Secondary | ICD-10-CM | POA: Diagnosis not present

## 2022-09-02 DIAGNOSIS — G250 Essential tremor: Secondary | ICD-10-CM | POA: Diagnosis not present

## 2022-09-02 DIAGNOSIS — K5901 Slow transit constipation: Secondary | ICD-10-CM

## 2022-09-02 DIAGNOSIS — K219 Gastro-esophageal reflux disease without esophagitis: Secondary | ICD-10-CM | POA: Diagnosis not present

## 2022-09-02 DIAGNOSIS — E034 Atrophy of thyroid (acquired): Secondary | ICD-10-CM

## 2022-09-02 DIAGNOSIS — M810 Age-related osteoporosis without current pathological fracture: Secondary | ICD-10-CM | POA: Diagnosis not present

## 2022-09-02 DIAGNOSIS — E782 Mixed hyperlipidemia: Secondary | ICD-10-CM

## 2022-09-02 NOTE — Patient Instructions (Addendum)
Recommended to take calcium 600 mg twice daily with Vitamin D 2000 units daily and weight bearing activity 30 mins/5 days a week   Can increase miralax 17 gm every other day, can take daily if needed for constipation

## 2022-09-02 NOTE — Progress Notes (Signed)
Careteam: Patient Care Team: Sharon Seller, NP as PCP - General (Geriatric Medicine)  PLACE OF SERVICE:  [facility] CLIN[facility]  Advanced Directive information Does Patient Have a Medical Advance Directive?: Yes, Type of Advance Directive: Healthcare Power of [place];Living will, Does patient want to make changes to medical advance directive?: No - Patient declined  No Known Allergies  Chief Complaint  Patient presents with   Medical Management of Chronic Issues    Routine visit. Discuss need for td/tdap, shingrix, and covid boosters. NCIR verified. Discuss Miralax. Patient c/o being shaky.      HPI: Patient is a 82 y.o. female for routine follow up.   She is using miralax twice weekly- still has issues with constipation, also takes prune juice which helps.   GERD-  using protonix twice daily which helps.   Htn- well controlled.   Hypothyroid- using synthroid  Occasionally will notice shaking in her hands but not often and does not bother her. Generally when her arms are outreached to get mail.   Osteoporosis- prolia too expensive. Can not afford. Unable to take fosamax due to severe GERD.    Review of Systems:  Review of Systems  Constitutional:  Negative for chills, fever and weight loss.  HENT:  Negative for tinnitus.   Respiratory:  Negative for cough, sputum production and shortness of breath.   Cardiovascular:  Negative for chest pain, palpitations and leg swelling.  Gastrointestinal:  Positive for constipation. Negative for abdominal pain, diarrhea and heartburn.  Genitourinary:  Negative for dysuria, frequency and urgency.  Musculoskeletal:  Negative for back pain, falls, joint pain and myalgias.  Skin: Negative.   Neurological:  Positive for tremors. Negative for dizziness and headaches.  Psychiatric/Behavioral:  Negative for depression and memory loss. The patient does not have insomnia.    Past Medical History:  Diagnosis Date   Conjunctivitis unspecified     Disorder of bone and cartilage, unspecified    GERD (gastroesophageal reflux disease)    Hepatic lesion    History of gallstones    Hyperlipidemia    Hypertension    Hypothyroidism    Kidney stone    Lacrimal disorder    Myalgia and myositis, unspecified    Osteoarthrosis, unspecified whether generalized or localized, unspecified site    Osteopenia    Osteoporosis    Osteoporosis, unspecified    Other and unspecified hyperlipidemia    Other dysphagia    Pain in joint, forearm    Reflux esophagitis    Unspecified disorder of kidney and ureter    Unspecified essential hypertension    Unspecified hypothyroidism    Unspecified vitamin D deficiency    Vitamin D deficiency    Vitiligo    Vitiligo    Past Surgical History:  Procedure Laterality Date   COLONOSCOPY     LEG SURGERY     right   TUBAL LIGATION  1973   Social History:   reports that she has never smoked. She has never used smokeless tobacco. She reports current alcohol use. She reports that she does not use drugs.  Family History  Problem Relation Age of Onset   Colon cancer Mother 48   Diabetes Mother    Diabetes Sister    Colon cancer Nephew    Esophageal cancer Neg Hx    Rectal cancer Neg Hx    Stomach cancer Neg Hx     Medications: Patient's Medications  New Prescriptions   No medications on file  Previous Medications  AMLODIPINE (NORVASC) 10 MG TABLET    TAKE 1 TABLET BY MOUTH DAILY   BISACODYL (LAXATIVE PO)    Take 1 tablet by mouth as needed.   CALCIUM CARB-CHOLECALCIFEROL 600-800 MG-UNIT TABS    Take 1 tablet by mouth 2 (two) times daily.   EZETIMIBE (ZETIA) 10 MG TABLET    TAKE 1 TABLET BY MOUTH DAILY   FAMOTIDINE (PEPCID) 40 MG TABLET    TAKE 1 TABLET BY MOUTH AT  BEDTIME   LEVOTHYROXINE (SYNTHROID) 75 MCG TABLET    TAKE 1 TABLET BY MOUTH ONCE  DAILY FOR THYROID   PANTOPRAZOLE (PROTONIX) 40 MG TABLET    TAKE 1 TABLET BY MOUTH TWICE  DAILY BEFORE A MEAL   POLYETHYLENE GLYCOL POWDER  (GLYCOLAX/MIRALAX) 17 GM/SCOOP POWDER    Take 17 g by mouth daily. Hold for loose stool  Modified Medications   No medications on file  Discontinued Medications   No medications on file    Physical Exam:  Vitals:   09/02/22 1033  BP: 118/74  Pulse: 82  Temp: (!) 97.5 F (36.4 C)  TempSrc: Temporal  SpO2: 99%  Weight: 115 lb (52.2 kg)  Height: 5' 1.5" (1.562 m)   Body mass index is 21.38 kg/m. Wt Readings from Last 3 Encounters:  09/02/22 115 lb (52.2 kg)  07/29/22 116 lb 9.6 oz (52.9 kg)  04/06/22 121 lb 3.2 oz (55 kg)    Physical Exam Constitutional:      General: She is not in acute distress.    Appearance: She is well-developed. She is not diaphoretic.  HENT:     Head: Normocephalic and atraumatic.     Mouth/Throat:     Pharynx: No oropharyngeal exudate.  Eyes:     Conjunctiva/sclera: Conjunctivae normal.     Pupils: Pupils are equal, round, and reactive to light.  Cardiovascular:     Rate and Rhythm: Normal rate and regular rhythm.     Heart sounds: Normal heart sounds.  Pulmonary:     Effort: Pulmonary effort is normal.     Breath sounds: Normal breath sounds.  Abdominal:     General: Bowel sounds are normal.     Palpations: Abdomen is soft.  Musculoskeletal:     Cervical back: Normal range of motion and neck supple.     Right lower leg: No edema.     Left lower leg: No edema.  Skin:    General: Skin is warm and dry.  Neurological:     Mental Status: She is alert.  Psychiatric:        Mood and Affect: Mood normal.     Labs reviewed: Basic Metabolic Panel: Recent Labs    02/21/22 1051  NA 141  K 3.8  CL 104  CO2 23  GLUCOSE 91  BUN 23  CREATININE 0.91  CALCIUM 10.1   Liver Function Tests: Recent Labs    02/21/22 1051  AST 15  ALT 4*  BILITOT 0.4  PROT 7.9   No results for input(s): "LIPASE", "AMYLASE" in the last 8760 hours. No results for input(s): "AMMONIA" in the last 8760 hours. CBC: Recent Labs    02/21/22 1051  WBC  4.5  NEUTROABS 2,498  HGB 14.6  HCT 44.7  MCV 84.8  PLT 203   Lipid Panel: Recent Labs    02/21/22 1051  CHOL 195  HDL 75  LDLCALC 102*  TRIG 90  CHOLHDL 2.6   TSH: No results for input(s): "TSH" in the last 8760 hours.  A1C: No results found for: "HGBA1C"   Assessment/Plan 1. Mixed hyperlipidemia Continue on zetia with dietary modifications  - Lipid panel - COMPLETE METABOLIC PANEL WITH GFR   2. Essential hypertension, benign -Blood pressure well controlled, goal bp <140/90 Continue current medications and dietary modifications follow metabolic panel - COMPLETE METABOLIC PANEL WITH GFR - CBC with Differential/Platelet  3. Gastroesophageal reflux disease, unspecified whether esophagitis present Stable on protonix, to continue lifestyle modifications as well.   4. Hypothyroidism due to acquired atrophy of thyroid -continues on synthroid  - TSH 5. Stage 3 chronic kidney disease, unspecified whether stage 3a or 3b CKD (HCC) -Chronic and stable Encourage proper hydration Follow metabolic panel Avoid nephrotoxic meds (NSAIDS) - COMPLETE METABOLIC PANEL WITH GFR  6. Slow transit constipation -increase miralax to every other day, can take daiy.  7. Age-related osteoporosis without current pathological fracture -Recommended to take calcium 600 mg twice daily with Vitamin D 2000 units daily and weight bearing activity 30 mins/5 days a week Unable to tolerate fosamax and can not afford to take prolia  8. Benign essential tremor Mild at this time, will monitor.    Return in about 6 months (around 03/04/2023) for routine follow up, labs at appt .  Tina Roberts. Biagio Borg [facility] [phone]

## 2022-09-03 LAB — CBC WITH DIFFERENTIAL/PLATELET
Absolute Monocytes: 548 cells/uL (ref 200–950)
Basophils Absolute: 40 cells/uL (ref 0–200)
Basophils Relative: 0.6 %
Eosinophils Absolute: 172 cells/uL (ref 15–500)
Eosinophils Relative: 2.6 %
HCT: 42.5 % (ref 35.0–45.0)
Hemoglobin: 13.5 g/dL (ref 11.7–15.5)
Lymphs Abs: 2211 cells/uL (ref 850–3900)
MCH: 27.3 pg (ref 27.0–33.0)
MCHC: 31.8 g/dL — ABNORMAL LOW (ref 32.0–36.0)
MCV: 85.9 fL (ref 80.0–100.0)
MPV: 10.4 fL (ref 7.5–12.5)
Monocytes Relative: 8.3 %
Neutro Abs: 3630 cells/uL (ref 1500–7800)
Neutrophils Relative %: 55 %
Platelets: 235 10*3/uL (ref 140–400)
RBC: 4.95 10*6/uL (ref 3.80–5.10)
RDW: 13.3 % (ref 11.0–15.0)
Total Lymphocyte: 33.5 %
WBC: 6.6 10*3/uL (ref 3.8–10.8)

## 2022-09-03 LAB — TSH: TSH: 2.16 mIU/L (ref 0.40–4.50)

## 2022-09-03 LAB — LIPID PANEL
Cholesterol: 202 mg/dL — ABNORMAL HIGH (ref <200)
HDL: 72 mg/dL (ref >=50)
LDL Cholesterol (Calc): 113 mg/dL (calc) — ABNORMAL HIGH
Non-HDL Cholesterol (Calc): 130 mg/dL (calc) — ABNORMAL HIGH (ref <130)
Total CHOL/HDL Ratio: 2.8 (calc) (ref <5.0)
Triglycerides: 75 mg/dL (ref <150)

## 2022-09-03 LAB — COMPLETE METABOLIC PANEL WITH GFR
AG Ratio: 1.8 (calc) (ref 1.0–2.5)
ALT: 7 U/L (ref 6–29)
AST: 16 U/L (ref 10–35)
Albumin: 4.8 g/dL (ref 3.6–5.1)
Alkaline phosphatase (APISO): 45 U/L (ref 37–153)
BUN/Creatinine Ratio: 20 (calc) (ref 6–22)
BUN: 19 mg/dL (ref 7–25)
CO2: 25 mmol/L (ref 20–32)
Calcium: 10 mg/dL (ref 8.6–10.4)
Chloride: 106 mmol/L (ref 98–110)
Creat: 0.96 mg/dL — ABNORMAL HIGH (ref 0.60–0.95)
Globulin: 2.7 g/dL (calc) (ref 1.9–3.7)
Glucose, Bld: 95 mg/dL (ref 65–99)
Potassium: 3.9 mmol/L (ref 3.5–5.3)
Sodium: 142 mmol/L (ref 135–146)
Total Bilirubin: 0.3 mg/dL (ref 0.2–1.2)
Total Protein: 7.5 g/dL (ref 6.1–8.1)
eGFR: 60 mL/min/{1.73_m2} (ref >=60)

## 2022-11-03 ENCOUNTER — Other Ambulatory Visit: Payer: Self-pay | Admitting: Nurse Practitioner

## 2022-11-03 DIAGNOSIS — Z1231 Encounter for screening mammogram for malignant neoplasm of breast: Secondary | ICD-10-CM

## 2022-11-25 ENCOUNTER — Other Ambulatory Visit: Payer: Self-pay | Admitting: Nurse Practitioner

## 2022-12-08 ENCOUNTER — Ambulatory Visit
Admission: RE | Admit: 2022-12-08 | Discharge: 2022-12-08 | Disposition: A | Discharge status: Home / Self Care | Payer: Medicare Other | Source: Ambulatory Visit | Attending: Nurse Practitioner | Admitting: Nurse Practitioner

## 2022-12-08 DIAGNOSIS — Z1231 Encounter for screening mammogram for malignant neoplasm of breast: Secondary | ICD-10-CM | POA: Diagnosis not present

## 2023-01-03 ENCOUNTER — Other Ambulatory Visit: Payer: Self-pay | Admitting: Nurse Practitioner

## 2023-01-03 DIAGNOSIS — E782 Mixed hyperlipidemia: Secondary | ICD-10-CM

## 2023-01-03 DIAGNOSIS — I10 Essential (primary) hypertension: Secondary | ICD-10-CM

## 2023-01-04 ENCOUNTER — Other Ambulatory Visit: Payer: Self-pay | Admitting: Nurse Practitioner

## 2023-01-04 NOTE — Telephone Encounter (Signed)
Pharmacy requested refill. Pended Rx and sent to Jessica for approval due to HIGH ALERT Warning.  

## 2023-01-12 ENCOUNTER — Telehealth: Payer: Medicare Other

## 2023-01-12 NOTE — Telephone Encounter (Signed)
[facility] called to request manufacture change on levothyroxine from mylan to alvogen.  Per standing protocol from PCP ok to switch and we will check TSH at next appointment to monitor

## 2023-03-06 ENCOUNTER — Ambulatory Visit (INDEPENDENT_AMBULATORY_CARE_PROVIDER_SITE_OTHER): Payer: Medicare Other | Admitting: Nurse Practitioner

## 2023-03-06 ENCOUNTER — Encounter: Payer: Self-pay | Admitting: Nurse Practitioner

## 2023-03-06 VITALS — BP 128/80 | HR 78 | Temp 97.3°F | Ht 61.5 in | Wt 119.8 lb

## 2023-03-06 DIAGNOSIS — K219 Gastro-esophageal reflux disease without esophagitis: Secondary | ICD-10-CM | POA: Diagnosis not present

## 2023-03-06 DIAGNOSIS — M810 Age-related osteoporosis without current pathological fracture: Secondary | ICD-10-CM

## 2023-03-06 DIAGNOSIS — Z23 Encounter for immunization: Secondary | ICD-10-CM | POA: Diagnosis not present

## 2023-03-06 DIAGNOSIS — E034 Atrophy of thyroid (acquired): Secondary | ICD-10-CM | POA: Diagnosis not present

## 2023-03-06 DIAGNOSIS — N183 Chronic kidney disease, stage 3 unspecified: Secondary | ICD-10-CM

## 2023-03-06 DIAGNOSIS — E782 Mixed hyperlipidemia: Secondary | ICD-10-CM

## 2023-03-06 DIAGNOSIS — I10 Essential (primary) hypertension: Secondary | ICD-10-CM | POA: Diagnosis not present

## 2023-03-06 MED ORDER — PANTOPRAZOLE SODIUM 40 MG PO TBEC: 40.0000 mg | DELAYED_RELEASE_TABLET | Freq: Every day | ORAL | Status: DC

## 2023-03-06 NOTE — Progress Notes (Signed)
Careteam: Patient Care Team: Sharon Seller, NP as PCP - General (Geriatric Medicine)  PLACE OF SERVICE:  [facility] CLINIC  Advanced Directive information Does Patient Have a Medical Advance Directive?: Yes, Type of Advance Directive: Healthcare Power of [place];Living will, Does patient want to make changes to medical advance directive?: No - Patient declined  No Known Allergies  Chief Complaint  Patient presents with   Medical Management of Chronic Issues    6 month follow-up. Discuss need fo rcovid booster, td/tdap, flu vaccine.  NCIR verified. Patient c/o lower abdominal/stomach pain off/on. Discuss protonix patient change to once daily      HPI: Patient is a 82 y.o. female for routine follow up.   Reports abdominal discomfort when she has to have a BM. When she has a BM it resolves. Reports happens twice a week and states it is not really bad.  She is hoping to change protonix to once daily and taking pepcid at night.   Constipation- controlled on miralax  Hypothyroid- recent change in manufacture for synthroid   Osteoporosis- does not wish to be on medication She is walking and takes vit d and calcium.   Review of Systems:  Review of Systems  Constitutional:  Negative for chills, fever and weight loss.  HENT:  Negative for tinnitus.   Respiratory:  Negative for cough, sputum production and shortness of breath.   Cardiovascular:  Negative for chest pain, palpitations and leg swelling.  Gastrointestinal:  Negative for abdominal pain, constipation, diarrhea and heartburn.  Genitourinary:  Negative for dysuria, frequency and urgency.  Musculoskeletal:  Negative for back pain, falls, joint pain and myalgias.  Skin: Negative.   Neurological:  Negative for dizziness and headaches.  Psychiatric/Behavioral:  Negative for depression and memory loss. The patient does not have insomnia.    Past Medical History:  Diagnosis Date   Conjunctivitis unspecified    Disorder of  bone and cartilage, unspecified    GERD (gastroesophageal reflux disease)    Hepatic lesion    History of gallstones    Hyperlipidemia    Hypertension    Hypothyroidism    Kidney stone    Lacrimal disorder    Myalgia and myositis, unspecified    Osteoarthrosis, unspecified whether generalized or localized, unspecified site    Osteopenia    Osteoporosis    Osteoporosis, unspecified    Other and unspecified hyperlipidemia    Other dysphagia    Pain in joint, forearm    Reflux esophagitis    Unspecified disorder of kidney and ureter    Unspecified essential hypertension    Unspecified hypothyroidism    Unspecified vitamin D deficiency    Vitamin D deficiency    Vitiligo    Vitiligo    Past Surgical History:  Procedure Laterality Date   COLONOSCOPY     LEG SURGERY     right   TUBAL LIGATION  1973   Social History:   reports that she has never smoked. She has never used smokeless tobacco. She reports current alcohol use. She reports that she does not use drugs.  Family History  Problem Relation Age of Onset   Colon cancer Mother 44   Diabetes Mother    Diabetes Sister    Colon cancer Nephew    Esophageal cancer Neg Hx    Rectal cancer Neg Hx    Stomach cancer Neg Hx     Medications: Patient's Medications  New Prescriptions   No medications on file  Previous Medications  AMLODIPINE (NORVASC) 10 MG TABLET    TAKE 1 TABLET BY MOUTH DAILY   CALCIUM CARB-CHOLECALCIFEROL 600-800 MG-UNIT TABS    Take 1 tablet by mouth 2 (two) times daily.   EZETIMIBE (ZETIA) 10 MG TABLET    TAKE 1 TABLET BY MOUTH DAILY   FAMOTIDINE (PEPCID) 40 MG TABLET    TAKE 1 TABLET BY MOUTH AT  BEDTIME   LEVOTHYROXINE (SYNTHROID) 75 MCG TABLET    TAKE 1 TABLET BY MOUTH ONCE  DAILY FOR THYROID   PANTOPRAZOLE (PROTONIX) 40 MG TABLET    TAKE 1 TABLET BY MOUTH TWICE  DAILY BEFORE A MEAL   POLYETHYLENE GLYCOL POWDER (GLYCOLAX/MIRALAX) 17 GM/SCOOP POWDER    Take 17 g by mouth daily. Hold for loose stool   Modified Medications   No medications on file  Discontinued Medications   No medications on file    Physical Exam:  Vitals:   03/06/23 0829  BP: 128/80  Pulse: 78  Temp: (!) 97.3 F (36.3 C)  TempSrc: Temporal  SpO2: 99%  Weight: 119 lb 12.8 oz (54.3 kg)  Height: 5' 1.5" (1.562 m)   Body mass index is 22.27 kg/m. Wt Readings from Last 3 Encounters:  03/06/23 119 lb 12.8 oz (54.3 kg)  09/02/22 115 lb (52.2 kg)  07/29/22 116 lb 9.6 oz (52.9 kg)    Physical Exam Constitutional:      General: She is not in acute distress.    Appearance: She is well-developed. She is not diaphoretic.  HENT:     Head: Normocephalic and atraumatic.     Mouth/Throat:     Pharynx: No oropharyngeal exudate.  Eyes:     Conjunctiva/sclera: Conjunctivae normal.     Pupils: Pupils are equal, round, and reactive to light.  Cardiovascular:     Rate and Rhythm: Normal rate and regular rhythm.     Heart sounds: Normal heart sounds.  Pulmonary:     Effort: Pulmonary effort is normal.     Breath sounds: Normal breath sounds.  Abdominal:     General: Bowel sounds are normal.     Palpations: Abdomen is soft.  Musculoskeletal:     Cervical back: Normal range of motion and neck supple.     Right lower leg: No edema.     Left lower leg: No edema.  Skin:    General: Skin is warm and dry.  Neurological:     Mental Status: She is alert.  Psychiatric:        Mood and Affect: Mood normal.     Labs reviewed: Basic Metabolic Panel: Recent Labs    09/02/22 1057  NA 142  K 3.9  CL 106  CO2 25  GLUCOSE 95  BUN 19  CREATININE 0.96*  CALCIUM 10.0  TSH 2.16   Liver Function Tests: Recent Labs    09/02/22 1057  AST 16  ALT 7  BILITOT 0.3  PROT 7.5   No results for input(s): "LIPASE", "AMYLASE" in the last 8760 hours. No results for input(s): "AMMONIA" in the last 8760 hours. CBC: Recent Labs    09/02/22 1057  WBC 6.6  NEUTROABS 3,630  HGB 13.5  HCT 42.5  MCV 85.9  PLT 235    Lipid Panel: Recent Labs    09/02/22 1057  CHOL 202*  HDL 72  LDLCALC 113*  TRIG 75  CHOLHDL 2.8   TSH: Recent Labs    09/02/22 1057  TSH 2.16   A1C: No results found for: "HGBA1C"  Assessment/Plan 1. Essential hypertension, benign -Blood pressure well controlled, goal bp <140/90 Continue current medications and dietary modifications follow metabolic panel - CBC with Differential/Platelet - Complete Metabolic Panel with eGFR  2. Mixed hyperlipidemia -continues on zetia with dietary modifications  3. Gastroesophageal reflux disease, unspecified whether esophagitis present Well controlled at this time. Has decreased protonix to daily and taking pepcid in the PM.  4. Hypothyroidism due to acquired atrophy of thyroid Continues on synthroid 75 mcg, recent manufacture change so will follow up TSH.  - TSH  5. Stage 3 chronic kidney disease, unspecified whether stage 3a or 3b CKD (HCC) -Chronic and stable Encourage proper hydration Follow metabolic panel Avoid nephrotoxic meds (NSAIDS) - Complete Metabolic Panel with eGFR  6. Need for influenza vaccination - Flu Vaccine Trivalent High Dose (Fluad)  7. Age-related osteoporosis without current pathological fracture -does not wish to start medication Continues on cal, vit d and weight bearing exercises (she is walking routinely)    Return in about 6 months (around 09/04/2023) for routine follow up.  Janene Harvey. Biagio Borg [facility] [phone]

## 2023-03-07 LAB — CBC WITH DIFFERENTIAL/PLATELET
Absolute Lymphocytes: 2639 {cells}/uL (ref 850–3900)
Absolute Monocytes: 631 {cells}/uL (ref 200–950)
Basophils Absolute: 42 {cells}/uL (ref 0–200)
Basophils Relative: 0.5 %
Eosinophils Absolute: 332 {cells}/uL (ref 15–500)
Eosinophils Relative: 4 %
HCT: 42.5 % (ref 35.0–45.0)
Hemoglobin: 13.5 g/dL (ref 11.7–15.5)
MCH: 27.5 pg (ref 27.0–33.0)
MCHC: 31.8 g/dL — ABNORMAL LOW (ref 32.0–36.0)
MCV: 86.6 fL (ref 80.0–100.0)
MPV: 10.4 fL (ref 7.5–12.5)
Monocytes Relative: 7.6 %
Neutro Abs: 4656 {cells}/uL (ref 1500–7800)
Neutrophils Relative %: 56.1 %
Platelets: 220 10*3/uL (ref 140–400)
RBC: 4.91 10*6/uL (ref 3.80–5.10)
RDW: 13 % (ref 11.0–15.0)
Total Lymphocyte: 31.8 %
WBC: 8.3 10*3/uL (ref 3.8–10.8)

## 2023-03-07 LAB — COMPLETE METABOLIC PANEL WITH GFR
AG Ratio: 1.4 (calc) (ref 1.0–2.5)
ALT: 6 U/L (ref 6–29)
AST: 17 U/L (ref 10–35)
Albumin: 4.4 g/dL (ref 3.6–5.1)
Alkaline phosphatase (APISO): 50 U/L (ref 37–153)
BUN: 22 mg/dL (ref 7–25)
CO2: 28 mmol/L (ref 20–32)
Calcium: 10.2 mg/dL (ref 8.6–10.4)
Chloride: 103 mmol/L (ref 98–110)
Creat: 0.91 mg/dL (ref 0.60–0.95)
Globulin: 3.2 g/dL (ref 1.9–3.7)
Glucose, Bld: 85 mg/dL (ref 65–99)
Potassium: 3.9 mmol/L (ref 3.5–5.3)
Sodium: 141 mmol/L (ref 135–146)
Total Bilirubin: 0.4 mg/dL (ref 0.2–1.2)
Total Protein: 7.6 g/dL (ref 6.1–8.1)
eGFR: 63 mL/min/{1.73_m2} (ref >=60)

## 2023-03-07 LAB — TSH: TSH: 2.62 m[IU]/L (ref 0.40–4.50)

## 2023-05-19 ENCOUNTER — Telehealth: Payer: Self-pay | Admitting: Emergency Medicine

## 2023-05-19 NOTE — Telephone Encounter (Signed)
Patient called about samples. Samples have been placed in a bag with name on it and put up front.

## 2023-06-11 ENCOUNTER — Other Ambulatory Visit: Payer: Self-pay | Admitting: Nurse Practitioner

## 2023-06-11 DIAGNOSIS — I10 Essential (primary) hypertension: Secondary | ICD-10-CM

## 2023-07-31 ENCOUNTER — Encounter: Payer: Medicare Other | Admitting: Nurse Practitioner

## 2023-08-01 ENCOUNTER — Other Ambulatory Visit: Payer: Self-pay

## 2023-08-01 MED ORDER — PANTOPRAZOLE SODIUM 40 MG PO TBEC: 40.0000 mg | DELAYED_RELEASE_TABLET | Freq: Every day | ORAL | 1 refills | Status: DC

## 2023-08-01 NOTE — Telephone Encounter (Signed)
 Patient is requesting refill on medication Pantoprazole . Medication hasn't been refilled by PCP Verma Gobble, NP yet. Medication pend and sent.

## 2023-08-03 ENCOUNTER — Other Ambulatory Visit: Payer: Self-pay

## 2023-08-03 MED ORDER — PANTOPRAZOLE SODIUM 40 MG PO TBEC: 40.0000 mg | DELAYED_RELEASE_TABLET | Freq: Every day | ORAL | 1 refills | Status: DC

## 2023-08-03 NOTE — Telephone Encounter (Signed)
 Patient is requesting refill be sent to [facility]

## 2023-08-07 NOTE — Telephone Encounter (Signed)
Medication refilled by PCP Eubanks, Jessica K, NP  

## 2023-09-04 ENCOUNTER — Ambulatory Visit: Payer: Medicare Other | Admitting: Nurse Practitioner

## 2023-09-04 DIAGNOSIS — H25813 Combined forms of age-related cataract, bilateral: Secondary | ICD-10-CM | POA: Diagnosis not present

## 2023-09-04 DIAGNOSIS — H40013 Open angle with borderline findings, low risk, bilateral: Secondary | ICD-10-CM | POA: Diagnosis not present

## 2023-09-11 ENCOUNTER — Encounter: Payer: Self-pay | Admitting: Nurse Practitioner

## 2023-09-11 ENCOUNTER — Ambulatory Visit (INDEPENDENT_AMBULATORY_CARE_PROVIDER_SITE_OTHER): Admitting: Nurse Practitioner

## 2023-09-11 VITALS — BP 124/78 | HR 82 | Temp 97.9°F | Resp 13 | Ht 61.5 in | Wt 113.8 lb

## 2023-09-11 DIAGNOSIS — R634 Abnormal weight loss: Secondary | ICD-10-CM

## 2023-09-11 DIAGNOSIS — K219 Gastro-esophageal reflux disease without esophagitis: Secondary | ICD-10-CM

## 2023-09-11 DIAGNOSIS — I10 Essential (primary) hypertension: Secondary | ICD-10-CM | POA: Diagnosis not present

## 2023-09-11 DIAGNOSIS — E782 Mixed hyperlipidemia: Secondary | ICD-10-CM | POA: Diagnosis not present

## 2023-09-11 DIAGNOSIS — E034 Atrophy of thyroid (acquired): Secondary | ICD-10-CM | POA: Diagnosis not present

## 2023-09-11 DIAGNOSIS — M810 Age-related osteoporosis without current pathological fracture: Secondary | ICD-10-CM

## 2023-09-11 DIAGNOSIS — K5901 Slow transit constipation: Secondary | ICD-10-CM | POA: Insufficient documentation

## 2023-09-11 DIAGNOSIS — N183 Chronic kidney disease, stage 3 unspecified: Secondary | ICD-10-CM | POA: Diagnosis not present

## 2023-09-11 NOTE — Assessment & Plan Note (Signed)
 Does not wish to be on medication.  Recommended to take calcium  600 mg twice daily with Vitamin D  2000 units daily and weight bearing activity 30 mins/5 days a week

## 2023-09-11 NOTE — Progress Notes (Signed)
 Careteam: Patient Care Team: Verma Gobble, NP as PCP - General (Geriatric Medicine)  PLACE OF SERVICE:  [facility] CLINIC  Advanced Directive information    No Known Allergies  Chief Complaint  Patient presents with   Medical Management of Chronic Issues    6 month follow up  Stomach has been hurting in lower region when having a bowel movement pt stated it feels like an overload been going on for the past month.  Patient Assistance Program for Mirilax wanted to know if she could use a sample.     HPI:  Discussed the use of AI scribe software for clinical note transcription with the patient, who gave verbal consent to proceed.  History of Present Illness Tina Roberts is an 83 year old female who presents for a routine follow-up and blood work.  She experiences chronic constipation with bowel movements occurring every three to four days. She takes Miralax  daily, which generally helps, but sometimes she feels 'overloaded'. Her diet includes vegetables and fiber. She experiences cramping during bowel movements, which resolves afterward.  She has issues with indigestion or acid reflux and is currently taking Protonix  in the morning and Pepcid  at bedtime. No indigestion or acid reflux was reported during the review of symptoms.  Her current medications include amlodipine  10 mg daily for hypertension, calcium  and vitamin D  twice daily for osteoporosis, Zetia  10 mg daily for hyperlipidemia, and levothyroxine  75 mcg daily for hypothyroidism.  She has experienced a weight loss of approximately six pounds. She reports a good appetite, eating cereal in the morning and sometimes lunch, but not consistently eating three meals a day. She denies difficulty eating.  She denies smoking and reports occasional leg cramps at night, which resolve upon walking.   Review of Systems:  Review of Systems  Constitutional:  Negative for chills, fever and weight loss.  HENT:  Negative for tinnitus.    Respiratory:  Negative for cough, sputum production and shortness of breath.   Cardiovascular:  Negative for chest pain, palpitations and leg swelling.  Gastrointestinal:  Positive for constipation. Negative for abdominal pain, diarrhea and heartburn.  Genitourinary:  Negative for dysuria, frequency and urgency.  Musculoskeletal:  Negative for back pain, falls, joint pain and myalgias.  Skin: Negative.   Neurological:  Negative for dizziness and headaches.  Psychiatric/Behavioral:  Negative for depression and memory loss. The patient does not have insomnia.     Past Medical History:  Diagnosis Date   Conjunctivitis unspecified    Disorder of bone and cartilage, unspecified    GERD (gastroesophageal reflux disease)    Hepatic lesion    History of gallstones    Hyperlipidemia    Hypertension    Hypothyroidism    Kidney stone    Lacrimal disorder    Myalgia and myositis, unspecified    Osteoarthrosis, unspecified whether generalized or localized, unspecified site    Osteopenia    Osteoporosis    Osteoporosis, unspecified    Other and unspecified hyperlipidemia    Other dysphagia    Pain in joint, forearm    Reflux esophagitis    Unspecified disorder of kidney and ureter    Unspecified essential hypertension    Unspecified hypothyroidism    Unspecified vitamin D  deficiency    Vitamin D  deficiency    Vitiligo    Vitiligo    Past Surgical History:  Procedure Laterality Date   COLONOSCOPY     LEG SURGERY     right   TUBAL LIGATION  17   Social History:   reports that she has never smoked. She has never used smokeless tobacco. She reports current alcohol use. She reports that she does not use drugs.  Family History  Problem Relation Age of Onset   Colon cancer Mother 84   Diabetes Mother    Diabetes Sister    Colon cancer Nephew    Esophageal cancer Neg Hx    Rectal cancer Neg Hx    Stomach cancer Neg Hx     Medications: Patient's Medications  New  Prescriptions   No medications on file  Previous Medications   AMLODIPINE  (NORVASC ) 10 MG TABLET    TAKE 1 TABLET BY MOUTH DAILY   CALCIUM  CARB-CHOLECALCIFEROL 600-800 MG-UNIT TABS    Take 1 tablet by mouth 2 (two) times daily.   EZETIMIBE  (ZETIA ) 10 MG TABLET    TAKE 1 TABLET BY MOUTH DAILY   FAMOTIDINE  (PEPCID ) 40 MG TABLET    TAKE 1 TABLET BY MOUTH AT  BEDTIME   LEVOTHYROXINE  (SYNTHROID ) 75 MCG TABLET    TAKE 1 TABLET BY MOUTH ONCE  DAILY FOR THYROID    PANTOPRAZOLE  (PROTONIX ) 40 MG TABLET    Take 1 tablet (40 mg total) by mouth daily.   POLYETHYLENE GLYCOL POWDER (GLYCOLAX /MIRALAX ) 17 GM/SCOOP POWDER    Take 17 g by mouth daily. Hold for loose stool  Modified Medications   No medications on file  Discontinued Medications   No medications on file    Physical Exam:  Vitals:   09/11/23 1156  BP: 124/78  Pulse: 82  Resp: 13  Temp: 97.9 F (36.6 C)  TempSrc: Temporal  SpO2: 98%  Weight: 113 lb 12.8 oz (51.6 kg)  Height: 5' 1.5" (1.562 m)   Body mass index is 21.15 kg/m. Wt Readings from Last 3 Encounters:  09/11/23 113 lb 12.8 oz (51.6 kg)  03/06/23 119 lb 12.8 oz (54.3 kg)  09/02/22 115 lb (52.2 kg)    Physical Exam Constitutional:      General: She is not in acute distress.    Appearance: She is well-developed. She is not diaphoretic.  HENT:     Head: Normocephalic and atraumatic.     Mouth/Throat:     Pharynx: No oropharyngeal exudate.  Eyes:     Conjunctiva/sclera: Conjunctivae normal.     Pupils: Pupils are equal, round, and reactive to light.  Cardiovascular:     Rate and Rhythm: Normal rate and regular rhythm.     Heart sounds: Normal heart sounds.  Pulmonary:     Effort: Pulmonary effort is normal.     Breath sounds: Normal breath sounds.  Abdominal:     General: Bowel sounds are normal.     Palpations: Abdomen is soft.  Musculoskeletal:     Cervical back: Normal range of motion and neck supple.     Right lower leg: No edema.     Left lower leg: No  edema.  Skin:    General: Skin is warm and dry.  Neurological:     Mental Status: She is alert.  Psychiatric:        Mood and Affect: Mood normal.     Labs reviewed: Basic Metabolic Panel: Recent Labs    03/06/23 0917  NA 141  K 3.9  CL 103  CO2 28  GLUCOSE 85  BUN 22  CREATININE 0.91  CALCIUM  10.2  TSH 2.62   Liver Function Tests: Recent Labs    03/06/23 0917  AST 17  ALT 6  BILITOT 0.4  PROT 7.6   No results for input(s): "LIPASE", "AMYLASE" in the last 8760 hours. No results for input(s): "AMMONIA" in the last 8760 hours. CBC: Recent Labs    03/06/23 0917  WBC 8.3  NEUTROABS 4,656  HGB 13.5  HCT 42.5  MCV 86.6  PLT 220   Lipid Panel: No results for input(s): "CHOL", "HDL", "LDLCALC", "TRIG", "CHOLHDL", "LDLDIRECT" in the last 8760 hours. TSH: Recent Labs    03/06/23 0917  TSH 2.62   A1C: No results found for: "HGBA1C"   Assessment/Plan Essential hypertension, benign Assessment & Plan: Blood pressure well controlled, goal bp <140/90 Continue current medications and dietary modifications follow metabolic panel  Orders: -     COMPLETE METABOLIC PANEL WITHOUT GFR -     CBC with Differential/Platelet  Mixed hyperlipidemia Assessment & Plan: Continues on zetia  daily   Orders: -     Lipid panel -     COMPLETE METABOLIC PANEL WITHOUT GFR  Gastroesophageal reflux disease, unspecified whether esophagitis present Assessment & Plan: Controlled on protonix  and pepcid    Hypothyroidism due to acquired atrophy of thyroid  Assessment & Plan: Continues on synthroid  75 mcg daily Follow TSH  Orders: -     TSH  Stage 3 chronic kidney disease, unspecified whether stage 3a or 3b CKD (HCC) Assessment & Plan: Chronic and stable Encourage proper hydration Follow metabolic panel Avoid nephrotoxic meds (NSAIDS)   Age-related osteoporosis without current pathological fracture Assessment & Plan: Does not wish to be on medication.  Recommended  to take calcium  600 mg twice daily with Vitamin D  2000 units daily and weight bearing activity 30 mins/5 days a week    Slow transit constipation Assessment & Plan: Currently taking miralax  daily but still only having a BM every 3-4 days Will increase BID at this time Make sure to stay hydrated    Weight loss  Noted weight loss, not eating routinely encouraged to liberalize diet. To have protein supplement in addition to smallest meal of the day. To eat 3 meals daily   Return in about 6 months (around 03/12/2024).  Joury Allcorn K. Denney Fisherman [facility] [phone]

## 2023-09-11 NOTE — Assessment & Plan Note (Signed)
 Blood pressure well controlled, goal bp <140/90 Continue current medications and dietary modifications follow metabolic panel

## 2023-09-11 NOTE — Patient Instructions (Signed)
 Increase miralax  to twice daily to help get bowels more regular Make sure you are drinking enough water  Make sure to eat 3 meals a day Protein supplement (ensure, boost) with smallest meal

## 2023-09-11 NOTE — Assessment & Plan Note (Signed)
 Continues on synthroid  75 mcg daily Follow TSH

## 2023-09-11 NOTE — Assessment & Plan Note (Signed)
 Controlled on protonix  and pepcid 

## 2023-09-11 NOTE — Assessment & Plan Note (Signed)
 Currently taking miralax  daily but still only having a BM every 3-4 days Will increase BID at this time Make sure to stay hydrated

## 2023-09-11 NOTE — Assessment & Plan Note (Signed)
 Chronic and stable Encourage proper hydration Follow metabolic panel Avoid nephrotoxic meds (NSAIDS)

## 2023-09-11 NOTE — Assessment & Plan Note (Signed)
 Continues on zetia  daily

## 2023-09-12 ENCOUNTER — Ambulatory Visit: Payer: Self-pay | Admitting: Nurse Practitioner

## 2023-09-12 ENCOUNTER — Ambulatory Visit: Payer: Self-pay

## 2023-09-12 LAB — COMPLETE METABOLIC PANEL WITHOUT GFR
AG Ratio: 1.6 (calc) (ref 1.0–2.5)
ALT: 7 U/L (ref 6–29)
AST: 16 U/L (ref 10–35)
Albumin: 4.7 g/dL (ref 3.6–5.1)
Alkaline phosphatase (APISO): 52 U/L (ref 37–153)
BUN: 15 mg/dL (ref 7–25)
CO2: 24 mmol/L (ref 20–32)
Calcium: 9.6 mg/dL (ref 8.6–10.4)
Chloride: 104 mmol/L (ref 98–110)
Creat: 0.73 mg/dL (ref 0.60–0.95)
Globulin: 3 g/dL (ref 1.9–3.7)
Glucose, Bld: 83 mg/dL (ref 65–99)
Potassium: 3.5 mmol/L (ref 3.5–5.3)
Sodium: 141 mmol/L (ref 135–146)
Total Bilirubin: 0.5 mg/dL (ref 0.2–1.2)
Total Protein: 7.7 g/dL (ref 6.1–8.1)

## 2023-09-12 LAB — CBC WITH DIFFERENTIAL/PLATELET
Absolute Lymphocytes: 2883 {cells}/uL (ref 850–3900)
Absolute Monocytes: 589 {cells}/uL (ref 200–950)
Basophils Absolute: 28 {cells}/uL (ref 0–200)
Basophils Relative: 0.4 %
Eosinophils Absolute: 192 {cells}/uL (ref 15–500)
Eosinophils Relative: 2.7 %
HCT: 45.3 % — ABNORMAL HIGH (ref 35.0–45.0)
Hemoglobin: 13.9 g/dL (ref 11.7–15.5)
MCH: 27.1 pg (ref 27.0–33.0)
MCHC: 30.7 g/dL — ABNORMAL LOW (ref 32.0–36.0)
MCV: 88.3 fL (ref 80.0–100.0)
MPV: 10.3 fL (ref 7.5–12.5)
Monocytes Relative: 8.3 %
Neutro Abs: 3408 {cells}/uL (ref 1500–7800)
Neutrophils Relative %: 48 %
Platelets: 221 10*3/uL (ref 140–400)
RBC: 5.13 10*6/uL — ABNORMAL HIGH (ref 3.80–5.10)
RDW: 13.8 % (ref 11.0–15.0)
Total Lymphocyte: 40.6 %
WBC: 7.1 10*3/uL (ref 3.8–10.8)

## 2023-09-12 LAB — LIPID PANEL
Cholesterol: 174 mg/dL (ref <200)
HDL: 81 mg/dL (ref >=50)
LDL Cholesterol (Calc): 79 mg/dL
Non-HDL Cholesterol (Calc): 93 mg/dL (ref <130)
Total CHOL/HDL Ratio: 2.1 (calc) (ref <5.0)
Triglycerides: 60 mg/dL (ref <150)

## 2023-09-12 LAB — TSH: TSH: 1.72 m[IU]/L (ref 0.40–4.50)

## 2023-09-12 NOTE — Telephone Encounter (Signed)
 Copied from CRM [id number]. Topic: Clinical - Lab/Test Results >> Sep 12, 2023 12:04 PM Carrielelia G wrote: Reason for CRM: read labs verbatim but patient would like to speak a nurse.   Reason for Disposition  Caller requesting routine or non-urgent lab result  Answer Assessment - Initial Assessment Questions 1. REASON FOR CALL or QUESTION: "What is your reason for calling today?" or "How can I best help you?" or "What question do you have that I can help answer?"     Lab results  2. CALLER: Document the source of call. (e.g., laboratory, patient).     Patient  Protocols used: PCP Call - No Triage-A-AH

## 2023-09-12 NOTE — Telephone Encounter (Signed)
 I called patient back and she only wanted to know exactly what her cholesterol values were.

## 2023-09-18 ENCOUNTER — Other Ambulatory Visit: Payer: Self-pay | Admitting: Nurse Practitioner

## 2023-09-18 DIAGNOSIS — Z1231 Encounter for screening mammogram for malignant neoplasm of breast: Secondary | ICD-10-CM

## 2023-09-22 ENCOUNTER — Telehealth: Payer: Self-pay

## 2023-09-22 NOTE — Telephone Encounter (Signed)
 Copied from CRM [id number]. Topic: Clinical - Medication Question >> Sep 22, 2023 12:18 PM Magdalene School wrote: Reason for CRM: Patient calling to check if office has free MiraLAX  she can come pick up.   Callback: [phone]  Patient will pick up Miralax  on Monday Morning

## 2023-09-24 ENCOUNTER — Other Ambulatory Visit: Payer: Self-pay | Admitting: Nurse Practitioner

## 2023-09-24 DIAGNOSIS — E782 Mixed hyperlipidemia: Secondary | ICD-10-CM

## 2023-10-08 ENCOUNTER — Other Ambulatory Visit: Payer: Self-pay | Admitting: Nurse Practitioner

## 2023-10-08 DIAGNOSIS — I10 Essential (primary) hypertension: Secondary | ICD-10-CM

## 2023-10-09 NOTE — Telephone Encounter (Signed)
 High Risk Warning Populated when attempting to refill, I will send to Provider for further review

## 2023-10-25 ENCOUNTER — Encounter: Payer: Self-pay | Admitting: Nurse Practitioner

## 2023-10-25 ENCOUNTER — Ambulatory Visit (INDEPENDENT_AMBULATORY_CARE_PROVIDER_SITE_OTHER): Admitting: Nurse Practitioner

## 2023-10-25 VITALS — BP 122/86 | HR 80 | Temp 97.4°F | Resp 13 | Ht 61.5 in | Wt 113.6 lb

## 2023-10-25 DIAGNOSIS — Z0000 Encounter for general adult medical examination without abnormal findings: Secondary | ICD-10-CM

## 2023-10-25 DIAGNOSIS — E2839 Other primary ovarian failure: Secondary | ICD-10-CM

## 2023-10-25 NOTE — Progress Notes (Signed)
 Subjective:   Tina Roberts is a 83 y.o. female who presents for Medicare Annual (Subsequent) preventive examination.  Visit Complete: In person [facility] clinic   Cardiac Risk Factors include: hypertension;dyslipidemia;advanced age (>31men, >12 women);sedentary lifestyle     Objective:    Today's Vitals   10/25/23 1106  BP: 122/86  Pulse: 80  Resp: 13  Temp: (!) 97.4 F (36.3 C)  SpO2: 98%  Weight: 113 lb 9.6 oz (51.5 kg)  Height: 5' 1.5 (1.562 m)   Body mass index is 21.12 kg/m.  Wt Readings from Last 3 Encounters:  10/25/23 113 lb 9.6 oz (51.5 kg)  09/11/23 113 lb 12.8 oz (51.6 kg)  03/06/23 119 lb 12.8 oz (54.3 kg)        03/06/2023    8:30 AM 09/02/2022   10:31 AM 07/29/2022   10:49 AM 04/06/2022    9:16 AM 08/16/2021    8:43 AM 07/20/2021    3:33 PM 04/12/2021    8:06 AM  Advanced Directives  Does Patient Have a Medical Advance Directive? Yes Yes Yes Yes Yes Yes Yes  Type of Estate agent of [place];Living will Healthcare Power of [place];Living will Healthcare Power of [place];Living will Healthcare Power of [employer] of [place];Living will Healthcare Power of [place];Living will Healthcare Power of Attorney  Does patient want to make changes to medical advance directive? No - Patient declined No - Patient declined No - Patient declined No - Patient declined No - Patient declined No - Patient declined No - Patient declined  Copy of Healthcare Power of Attorney in Chart? Yes - validated most recent copy scanned in chart (See row information) Yes - validated most recent copy scanned in chart (See row information) Yes - validated most recent copy scanned in chart (See row information) No - copy requested Yes - validated most recent copy scanned in chart (See row information) Yes - validated most recent copy scanned in chart (See row information) Yes - validated most recent copy scanned in chart (See row information)    Current  Medications (verified) Outpatient Encounter Medications as of 10/25/2023  Medication Sig   amLODipine  (NORVASC ) 10 MG tablet TAKE 1 TABLET BY MOUTH DAILY   Calcium  Carb-Cholecalciferol 600-800 MG-UNIT TABS Take 1 tablet by mouth 2 (two) times daily.   ezetimibe  (ZETIA ) 10 MG tablet TAKE 1 TABLET BY MOUTH DAILY   famotidine  (PEPCID ) 40 MG tablet TAKE 1 TABLET BY MOUTH AT  BEDTIME   levothyroxine  (SYNTHROID ) 75 MCG tablet TAKE 1 TABLET BY MOUTH ONCE  DAILY FOR THYROID    pantoprazole  (PROTONIX ) 40 MG tablet Take 1 tablet (40 mg total) by mouth daily.   polyethylene glycol powder (GLYCOLAX /MIRALAX ) 17 GM/SCOOP powder Take 17 g by mouth daily. Hold for loose stool   No facility-administered encounter medications on file as of 10/25/2023.    Allergies (verified) Patient has no known allergies.   History: Past Medical History:  Diagnosis Date   Conjunctivitis unspecified    Disorder of bone and cartilage, unspecified    GERD (gastroesophageal reflux disease)    Hepatic lesion    History of gallstones    Hyperlipidemia    Hypertension    Hypothyroidism    Kidney stone    Lacrimal disorder    Myalgia and myositis, unspecified    Osteoarthrosis, unspecified whether generalized or localized, unspecified site    Osteopenia    Osteoporosis    Osteoporosis, unspecified    Other and unspecified hyperlipidemia  Other dysphagia    Pain in joint, forearm    Reflux esophagitis    Unspecified disorder of kidney and ureter    Unspecified essential hypertension    Unspecified hypothyroidism    Unspecified vitamin D  deficiency    Vitamin D  deficiency    Vitiligo    Vitiligo    Past Surgical History:  Procedure Laterality Date   COLONOSCOPY     LEG SURGERY     right   TUBAL LIGATION  1973   Family History  Problem Relation Age of Onset   Colon cancer Mother 50   Diabetes Mother    Diabetes Sister    Colon cancer Nephew    Esophageal cancer Neg Hx    Rectal cancer Neg Hx     Stomach cancer Neg Hx    Social History   Socioeconomic History   Marital status: Married    Spouse name: Not on file   Number of children: 4   Years of education: Not on file   Highest education level: Not on file  Occupational History   Not on file  Tobacco Use   Smoking status: Never   Smokeless tobacco: Never  Vaping Use   Vaping status: Never Used  Substance and Sexual Activity   Alcohol use: Yes    Alcohol/week: 0.0 standard drinks of alcohol    Comment: occasional drink wine    Drug use: No   Sexual activity: Not Currently  Other Topics Concern   Not on file  Social History Narrative   Not on file   Social Drivers of Health   Financial Resource Strain: Low Risk  (12/01/2017)   Overall Financial Resource Strain (CARDIA)    Difficulty of Paying Living Expenses: Not hard at all  Food Insecurity: No Food Insecurity (12/01/2017)   Hunger Vital Sign    Worried About Running Out of Food in the Last Year: Never true    Ran Out of Food in the Last Year: Never true  Transportation Needs: No Transportation Needs (12/01/2017)   PRAPARE - Administrator, Civil Service (Medical): No    Lack of Transportation (Non-Medical): No  Physical Activity: Sufficiently Active (12/01/2017)   Exercise Vital Sign    Days of Exercise per Week: 3 days    Minutes of Exercise per Session: 90 min  Stress: No Stress Concern Present (12/01/2017)   [employer] of Occupational Health - Occupational Stress Questionnaire    Feeling of Stress : Only a little  Social Connections: Somewhat Isolated (12/01/2017)   Social Connection and Isolation Panel    Frequency of Communication with Friends and Family: More than three times a week    Frequency of Social Gatherings with Friends and Family: More than three times a week    Attends Religious Services: Never    Database administrator or Organizations: No    Attends Engineer, structural: Never    Marital Status: Married     Tobacco Counseling Counseling given: Not Answered   Clinical Intake:  Pre-visit preparation completed: Yes  Pain : No/denies pain     BMI - recorded: 21.12 Nutritional Status: BMI of 19-24  Normal Diabetes: No            Activities of Daily Living    10/25/2023   11:39 AM  In your present state of health, do you have any difficulty performing the following activities:  Hearing? 1  Vision? 0  Difficulty concentrating or making decisions? 1  Comment forgetful  Walking or climbing stairs? 0  Dressing or bathing? 0  Doing errands, shopping? 0  Preparing Food and eating ? N  Using the Toilet? N  In the past six months, have you accidently leaked urine? N  Do you have problems with loss of bowel control? N  Managing your Medications? N  Managing your Finances? N  Housekeeping or managing your Housekeeping? N    Patient Care Team: Caro Harlene POUR, NP as PCP - General (Geriatric Medicine)  Indicate any recent Medical Services you may have received from other than [facility] providers in the past year (date may be approximate).     Assessment:   This is a routine wellness examination for Arlita.  Hearing/Vision screen Hearing Screening - Comments:: Patient has trouble hearing may need hearing aids Vision Screening - Comments:: Patient stated everything is good.    Goals Addressed   None    Depression Screen    10/25/2023   11:06 AM 10/25/2023   11:04 AM 03/06/2023    8:40 AM 09/02/2022   10:31 AM 07/29/2022   10:51 AM 02/21/2022   10:15 AM 07/20/2021    3:29 PM  PHQ 2/9 Scores  PHQ - 2 Score 0 0 0 0 0 0 0  PHQ- 9 Score 0          Fall Risk    10/25/2023   11:05 AM 09/11/2023   11:55 AM 03/06/2023    8:40 AM 09/02/2022   10:30 AM 07/29/2022   10:50 AM  Fall Risk   Falls in the past year? 0 0 0 0 0  Number falls in past yr: 0 0 0 0 0  Injury with Fall? 0 0 0 0 0  Risk for fall due to : No Fall Risks No Fall Risks No Fall Risks No Fall Risks Other  (Comment)  Follow up Falls evaluation completed Falls evaluation completed Falls evaluation completed Falls evaluation completed Falls evaluation completed    MEDICARE RISK AT HOME: Medicare Risk at Home Any stairs in or around the home?: No Home free of loose throw rugs in walkways, pet beds, electrical cords, etc?: Yes Adequate lighting in your home to reduce risk of falls?: Yes Life alert?: No Use of a cane, walker or w/c?: No Grab bars in the bathroom?: No Shower chair or bench in shower?: No Elevated toilet seat or a handicapped toilet?: No  TIMED UP AND GO:  Was the test performed?  No    Cognitive Function:    07/29/2022   10:52 AM 12/05/2018   10:21 AM 12/01/2017   10:15 AM 11/25/2016   10:19 AM 03/16/2016    8:50 AM  MMSE - Mini Mental State Exam  Orientation to time 5 5 5 5  5    Orientation to Place 5 5 5 5  5    Registration 3 3 3 3  3    Attention/ Calculation 0 3 0 0  0   Recall 3 2 3 1  3    Language- name 2 objects 2 2 2 2  2    Language- repeat 1 1 1 1 1   Language- follow 3 step command 3 2 3 3  3    Language- follow 3 step command-comments  Folded paper x 2     Language- read & follow direction 1 0 1 1  1    Language-read & follow direction-comments  read sentence, did not close eyes     Write a sentence 0 1 1 1  1   Copy design 0 0 0 0  0   Total score 23 24 24 22  24       Data saved with a previous flowsheet row definition        10/25/2023   11:09 AM 07/20/2021    3:34 PM 07/15/2020   12:38 PM  6CIT Screen  What Year? 0 points 0 points 0 points  What month? 0 points 0 points 0 points  What time? 0 points 0 points 0 points  Count back from 20 0 points 4 points 4 points  Months in reverse 0 points 0 points 4 points  Repeat phrase 4 points 2 points 0 points  Total Score 4 points 6 points 8 points    Immunizations Immunization History  Administered Date(s) Administered   Fluad Quad(high Dose 65+) 12/05/2018, 01/10/2020, 01/04/2021, 02/21/2022   Fluad  Trivalent(High Dose 65+) 03/06/2023   Influenza Whole 12/03/2012   Influenza, High Dose Seasonal PF 11/25/2016   Influenza,inj,Quad PF,6+ Mos 01/29/2014, 01/09/2015, 01/03/2018   Influenza-Unspecified 01/20/2016   Moderna Sars-Covid-2 Vaccination 06/14/2019, 07/15/2019   Pneumococcal Conjugate-13 01/29/2014   Pneumococcal Polysaccharide-23 11/20/2012   Zoster Recombinant(Shingrix) 09/26/2022, 11/30/2022    TDAP status: Due, Education has been provided regarding the importance of this vaccine. Advised may receive this vaccine at local pharmacy or Health Dept. Aware to provide a copy of the vaccination record if obtained from local pharmacy or Health Dept. Verbalized acceptance and understanding.  Flu Vaccine status: Up to date  Pneumococcal vaccine status: Up to date  Covid-19 vaccine status: Information provided on how to obtain vaccines.   Qualifies for Shingles Vaccine? Yes   Zostavax completed No   Shingrix Completed?: Yes  Screening Tests Health Maintenance  Topic Date Due   COVID-19 Vaccine (3 - Moderna risk series) 11/11/2023 (Originally 08/12/2019)   DTaP/Tdap/Td (1 - Tdap) 09/10/2024 (Originally 11/21/1959)   INFLUENZA VACCINE  11/03/2023   Medicare Annual Wellness (AWV)  10/24/2024   Pneumococcal Vaccine: 50+ Years  Completed   DEXA SCAN  Completed   Zoster Vaccines- Shingrix  Completed   Hepatitis B Vaccines  Aged Out   HPV VACCINES  Aged Out   Meningococcal B Vaccine  Aged Out   Hepatitis C Screening  Discontinued    Health Maintenance  There are no preventive care reminders to display for this patient.  Colorectal cancer screening: No longer required.   Mammogram status: No longer required due to aged.  Bone Density status: Completed 01/10/2022. Results reflect: Bone density results: OSTEOPOROSIS. Repeat every 2 years.  Lung Cancer Screening: (Low Dose CT Chest recommended if Age 71-80 years, 20 pack-year currently smoking OR have quit w/in 15years.) does not  qualify.   Lung Cancer Screening Referral: na  Additional Screening:  Hepatitis C Screening: does not qualify  Vision Screening: Recommended annual ophthalmology exams for early detection of glaucoma and other disorders of the eye. Is the patient up to date with their annual eye exam?  Yes  Who is the provider or what is the name of the office in which the patient attends annual eye exams? [facility] If pt is not established with a provider, would they like to be referred to a provider to establish care? No .   Dental Screening: Recommended annual dental exams for proper oral hygiene   Community Resource Referral / Chronic Care Management: CRR required this visit?  No   CCM required this visit?  No     Plan:  I have personally reviewed and noted the following in the patient's chart:   Medical and social history Use of alcohol, tobacco or illicit drugs  Current medications and supplements including opioid prescriptions. Patient is not currently taking opioid prescriptions. Functional ability and status Nutritional status Physical activity Advanced directives List of other physicians Hospitalizations, surgeries, and ER visits in previous 12 months Vitals Screenings to include cognitive, depression, and falls Referrals and appointments  In addition, I have reviewed and discussed with patient certain preventive protocols, quality metrics, and best practice recommendations. A written personalized care plan for preventive services as well as general preventive health recommendations were provided to patient.     Harlene MARLA An, NP   10/25/2023    Reports occasional blood in urine- none currently and vaginal discharge, follow up appt made but will come back sooner if reoccurs.

## 2023-10-25 NOTE — Patient Instructions (Addendum)
  Ms. Looper , Thank you for taking time to come for your Medicare Wellness Visit. I appreciate your ongoing commitment to your health goals. Please review the following plan we discussed and let me know if I can assist you in the future.   This is a list of the screening recommended for you and due dates:  Health Maintenance  Topic Date Due   COVID-19 Vaccine (3 - Moderna risk series) 11/11/2023*   DTaP/Tdap/Td vaccine (1 - Tdap) 09/10/2024*   Flu Shot  11/03/2023   Medicare Annual Wellness Visit  10/24/2024   Pneumococcal Vaccine for age over 43  Completed   DEXA scan (bone density measurement)  Completed   Zoster (Shingles) Vaccine  Completed   Hepatitis B Vaccine  Aged Out   HPV Vaccine  Aged Out   Meningitis B Vaccine  Aged Out   Hepatitis C Screening  Discontinued  *Topic was postponed. The date shown is not the original due date.

## 2023-11-03 ENCOUNTER — Other Ambulatory Visit: Payer: Self-pay | Admitting: Family

## 2023-11-03 DIAGNOSIS — K5901 Slow transit constipation: Secondary | ICD-10-CM

## 2023-11-06 ENCOUNTER — Other Ambulatory Visit: Payer: Self-pay

## 2023-11-06 DIAGNOSIS — K5901 Slow transit constipation: Secondary | ICD-10-CM

## 2023-11-06 MED ORDER — POLYETHYLENE GLYCOL 3350 17 GM/SCOOP PO POWD: 17.0000 g | Freq: Every day | ORAL | 1 refills | Status: AC

## 2023-11-09 ENCOUNTER — Telehealth: Payer: Self-pay

## 2023-11-09 NOTE — Telephone Encounter (Signed)
 Copied from CRM [id number]. Topic: Clinical - Request for Lab/Test Order >> Nov 09, 2023 11:39 AM Susanna ORN wrote: Reason for CRM: Patient called stating that Dr. Caro was suppose to get her scheduled for a bone density test. States that she hasn't heard anything yet. States she's not hurting or anything but just didn't want to miss the appt for it. Please give patient a call back to advise. CB #: [phone].

## 2023-11-10 NOTE — Telephone Encounter (Signed)
 [facility] contacted patient 10-26-23 Left a message to schedule appt. I called her today at [phone] and her phone rang multiple times then went to a busy signal. Please let her know to call [facility] for Radiology at [phone] to schedule her bone density test.

## 2023-11-27 ENCOUNTER — Encounter: Payer: Self-pay | Admitting: Nurse Practitioner

## 2023-11-27 ENCOUNTER — Ambulatory Visit (INDEPENDENT_AMBULATORY_CARE_PROVIDER_SITE_OTHER): Admitting: Nurse Practitioner

## 2023-11-27 VITALS — BP 118/88 | HR 79 | Temp 98.2°F | Resp 13 | Ht 61.5 in | Wt 116.6 lb

## 2023-11-27 DIAGNOSIS — N898 Other specified noninflammatory disorders of vagina: Secondary | ICD-10-CM | POA: Diagnosis not present

## 2023-11-27 LAB — POCT URINALYSIS DIPSTICK
Blood, UA: POSITIVE
Glucose, UA: NEGATIVE
Nitrite, UA: NEGATIVE
Protein, UA: POSITIVE — AB
Spec Grav, UA: 1.01 (ref 1.010–1.025)
Urobilinogen, UA: 0.2 U/dL
pH, UA: 5 (ref 5.0–8.0)

## 2023-11-27 MED ORDER — METRONIDAZOLE 500 MG PO TABS: 500.0000 mg | ORAL_TABLET | Freq: Two times a day (BID) | ORAL | 0 refills | Status: AC

## 2023-11-27 NOTE — Patient Instructions (Addendum)
 [phone] to call to schedule bone density   To start metroNIDAZOLE  (FLAGYL ) - take twice daily until complete due to vaginal discharge

## 2023-11-27 NOTE — Progress Notes (Signed)
 Careteam: Patient Care Team: Tina Harlene POUR, NP as PCP - General (Geriatric Medicine)  PLACE OF SERVICE:  [facility]  Advanced Directive information    No Known Allergies  Chief Complaint  Patient presents with   Acute Visit    Pt has been seeing blood in urine. She hasn't seen any since her last visit which was 7/23. Patient wanted to make sure everything is okay    HPI:  Discussed the use of AI scribe software for clinical note transcription with the patient, who gave verbal consent to proceed.  History of Present Illness Tina Roberts is an 83 year old female who presents with heavy vaginal discharge.  She has been experiencing a heavy vaginal discharge for approximately four months. The discharge is heavy enough to require wearing a pad and sometimes appears on toilet paper after urination. It is described as similar to mucus from a cold. There is no associated itching, pain, or odor.  She denies any recent blood in her urine, which she had experienced prior to her last visit. She also denies any vaginal bleeding, although she mentions having seen blood a couple of times when wiping, but not consistently. She has not had any new sexual partners and denies any history of abuse.      Review of Systems:  Review of Systems  Genitourinary:  Negative for dysuria, frequency, hematuria and urgency.       No recent blood in urine noted Having vaginal discharge for 4 months Sometimes light and sometimes heavy No odor    Past Medical History:  Diagnosis Date   Conjunctivitis unspecified    Disorder of bone and cartilage, unspecified    GERD (gastroesophageal reflux disease)    Hepatic lesion    History of gallstones    Hyperlipidemia    Hypertension    Hypothyroidism    Kidney stone    Lacrimal disorder    Myalgia and myositis, unspecified    Osteoarthrosis, unspecified whether generalized or localized, unspecified site    Osteopenia    Osteoporosis     Osteoporosis, unspecified    Other and unspecified hyperlipidemia    Other dysphagia    Pain in joint, forearm    Reflux esophagitis    Unspecified disorder of kidney and ureter    Unspecified essential hypertension    Unspecified hypothyroidism    Unspecified vitamin D  deficiency    Vitamin D  deficiency    Vitiligo    Vitiligo    Past Surgical History:  Procedure Laterality Date   COLONOSCOPY     LEG SURGERY     right   TUBAL LIGATION  1973   Social History:   reports that she has never smoked. She has never used smokeless tobacco. She reports current alcohol use. She reports that she does not use drugs.  Family History  Problem Relation Age of Onset   Colon cancer Mother 5   Diabetes Mother    Diabetes Sister    Colon cancer Nephew    Esophageal cancer Neg Hx    Rectal cancer Neg Hx    Stomach cancer Neg Hx     Medications: Patient's Medications  New Prescriptions   No medications on file  Previous Medications   AMLODIPINE  (NORVASC ) 10 MG TABLET    TAKE 1 TABLET BY MOUTH DAILY   CALCIUM  CARB-CHOLECALCIFEROL 600-800 MG-UNIT TABS    Take 1 tablet by mouth 2 (two) times daily.   EZETIMIBE  (ZETIA ) 10 MG TABLET  TAKE 1 TABLET BY MOUTH DAILY   FAMOTIDINE  (PEPCID ) 40 MG TABLET    TAKE 1 TABLET BY MOUTH AT  BEDTIME   LEVOTHYROXINE  (SYNTHROID ) 75 MCG TABLET    TAKE 1 TABLET BY MOUTH ONCE  DAILY FOR THYROID    PANTOPRAZOLE  (PROTONIX ) 40 MG TABLET    Take 1 tablet (40 mg total) by mouth daily.   POLYETHYLENE GLYCOL POWDER (GLYCOLAX /MIRALAX ) 17 GM/SCOOP POWDER    Take 17 g by mouth daily. Hold for loose stool  Modified Medications   No medications on file  Discontinued Medications   No medications on file    Physical Exam:  Vitals:   11/27/23 1340  BP: 118/88  Pulse: 79  Resp: 13  Temp: 98.2 F (36.8 C)  SpO2: 96%  Weight: 116 lb 9.6 oz (52.9 kg)  Height: 5' 1.5 (1.562 m)   Body mass index is 21.67 kg/m. Wt Readings from Last 3 Encounters:  11/27/23 116 lb  9.6 oz (52.9 kg)  10/25/23 113 lb 9.6 oz (51.5 kg)  09/11/23 113 lb 12.8 oz (51.6 kg)    Physical Exam Exam conducted with a chaperone present.  Constitutional:      Appearance: Normal appearance.  Pulmonary:     Effort: Pulmonary effort is normal.  Genitourinary:    Vagina: Vaginal discharge and bleeding present.  Neurological:     Mental Status: She is alert. Mental status is at baseline.  Psychiatric:        Mood and Affect: Mood normal.     Labs reviewed: Basic Metabolic Panel: Recent Labs    03/06/23 0917 09/11/23 1229  NA 141 141  K 3.9 3.5  CL 103 104  CO2 28 24  GLUCOSE 85 83  BUN 22 15  CREATININE 0.91 0.73  CALCIUM  10.2 9.6  TSH 2.62 1.72   Liver Function Tests: Recent Labs    03/06/23 0917 09/11/23 1229  AST 17 16  ALT 6 7  BILITOT 0.4 0.5  PROT 7.6 7.7   No results for input(s): LIPASE, AMYLASE in the last 8760 hours. No results for input(s): AMMONIA in the last 8760 hours. CBC: Recent Labs    03/06/23 0917 09/11/23 1229  WBC 8.3 7.1  NEUTROABS 4,656 3,408  HGB 13.5 13.9  HCT 42.5 45.3*  MCV 86.6 88.3  PLT 220 221   Lipid Panel: Recent Labs    09/11/23 1229  CHOL 174  HDL 81  LDLCALC 79  TRIG 60  CHOLHDL 2.1   TSH: Recent Labs    03/06/23 0917 09/11/23 1229  TSH 2.62 1.72   A1C: No results found for: HGBA1C   Assessment/Plan  Vaginal discharge and bleeding - ongoing discharge suspected BV will culture and treat -     Ambulatory referral to Gynecology due to vaginal bleeding -     metroNIDAZOLE ; Take 1 tablet (500 mg total) by mouth 2 (two) times daily for 7 days.  Dispense: 14 tablet; Refill: 0 -     SureSwab Advanced Bacterial Vaginosis (BV), CT/NG, TMA  To keep follow up as scheduled.   Amaar Oshita K. Tina BODILY [facility] [phone]

## 2023-11-28 LAB — SURESWAB® ADVANCED BACTERIAL VAGINOSIS (BV), CT/NG,TMA
C. trachomatis RNA, TMA: NOT DETECTED
N. gonorrhoeae RNA, TMA: NOT DETECTED
SURESWAB(R) ADV BACTERIAL VAGINOSIS(BV),TMA: NEGATIVE

## 2023-11-29 ENCOUNTER — Ambulatory Visit: Payer: Self-pay | Admitting: Nurse Practitioner

## 2023-12-04 ENCOUNTER — Other Ambulatory Visit: Payer: Self-pay | Admitting: Nurse Practitioner

## 2023-12-11 ENCOUNTER — Ambulatory Visit
Admission: RE | Admit: 2023-12-11 | Discharge: 2023-12-11 | Disposition: A | Discharge status: Home / Self Care | Source: Ambulatory Visit | Attending: Nurse Practitioner | Admitting: Nurse Practitioner

## 2023-12-11 DIAGNOSIS — Z1231 Encounter for screening mammogram for malignant neoplasm of breast: Secondary | ICD-10-CM

## 2024-03-08 ENCOUNTER — Inpatient Hospital Stay (HOSPITAL_BASED_OUTPATIENT_CLINIC_OR_DEPARTMENT_OTHER): Admission: RE | Admit: 2024-03-08 | Source: Ambulatory Visit

## 2024-03-11 ENCOUNTER — Ambulatory Visit: Payer: Self-pay | Admitting: Nurse Practitioner

## 2024-04-12 ENCOUNTER — Encounter: Payer: Self-pay | Admitting: Nurse Practitioner

## 2024-04-12 ENCOUNTER — Ambulatory Visit: Admitting: Nurse Practitioner

## 2024-04-12 VITALS — BP 120/64 | HR 85 | Temp 97.1°F | Ht 61.5 in | Wt 115.0 lb

## 2024-04-12 DIAGNOSIS — Z23 Encounter for immunization: Secondary | ICD-10-CM

## 2024-04-12 DIAGNOSIS — E034 Atrophy of thyroid (acquired): Secondary | ICD-10-CM | POA: Diagnosis not present

## 2024-04-12 DIAGNOSIS — K5901 Slow transit constipation: Secondary | ICD-10-CM | POA: Diagnosis not present

## 2024-04-12 DIAGNOSIS — M810 Age-related osteoporosis without current pathological fracture: Secondary | ICD-10-CM | POA: Diagnosis not present

## 2024-04-12 DIAGNOSIS — I10 Essential (primary) hypertension: Secondary | ICD-10-CM

## 2024-04-12 DIAGNOSIS — K219 Gastro-esophageal reflux disease without esophagitis: Secondary | ICD-10-CM | POA: Diagnosis not present

## 2024-04-12 DIAGNOSIS — E782 Mixed hyperlipidemia: Secondary | ICD-10-CM | POA: Diagnosis not present

## 2024-04-12 NOTE — Progress Notes (Unsigned)
 "   Careteam: Patient Care Team: Caro Harlene POUR, NP as PCP - General (Geriatric Medicine)  PLACE OF SERVICE:  [facility]  Advanced Directive information Does Patient Have a Medical Advance Directive?: Yes, Type of Advance Directive: Healthcare Power of [place];Living will, Does patient want to make changes to medical advance directive?: No - Patient declined  Allergies[1]  Chief Complaint  Patient presents with   Medical Management of Chronic Issues    6 month follow-up. Discussed need for covid booster and flu vaccine.     HPI:  Discussed the use of AI scribe software for clinical note transcription with the patient, who gave verbal consent to proceed.  History of Present Illness Tina Roberts is an 84 year old female who presents for a six-month follow-up.  She experiences a sensation of having 'a lot of old gauze up my throat' and uses Mucinex to manage the mucus, which she finds helpful. She experiences occasional mucus in her throat, which she manages with Mucinex.  She takes Norvasc  10 mg daily for blood pressure and follows a low sodium diet. She is up to date with her pneumonia vaccinations, having received them in 2014 and 2015.  She has osteoporosis and is currently taking calcium  and vitamin D  supplements. She has previously declined additional medication for osteoporosis but is unsure about starting new treatment.  For hyperlipidemia, she is on Zetia  10 mg daily, and her cholesterol levels were satisfactory in June. She follows dietary modifications for cholesterol management.  Her acid reflux is managed with Pepcid  and Protonix .   She also has a history of constipation, which is managed with daily Miralax .  She is on Synthroid  75 mcg for hypothyroidism, with TSH levels checked in June.  She reports a history of heavy vaginal discharge, which improved with antibiotics but still persists lightly. She notes history of seeing blood but none recently. She has an  upcoming appointment with a gynecologist in February for further evaluation. No unusual bruising, bleeding, or blood in stools. No odor or pain associated with the discharge.   Review of Systems:  Review of Systems  Constitutional:  Negative for chills, fever and weight loss.  HENT:  Negative for tinnitus.   Respiratory:  Negative for cough, sputum production and shortness of breath.   Cardiovascular:  Negative for chest pain, palpitations and leg swelling.  Gastrointestinal:  Negative for abdominal pain, constipation, diarrhea and heartburn.  Genitourinary:  Negative for dysuria, frequency and urgency.  Musculoskeletal:  Negative for back pain, falls, joint pain and myalgias.  Skin: Negative.   Neurological:  Negative for dizziness and headaches.  Psychiatric/Behavioral:  Negative for depression and memory loss. The patient does not have insomnia.     Past Medical History:  Diagnosis Date   Conjunctivitis unspecified    Disorder of bone and cartilage, unspecified    GERD (gastroesophageal reflux disease)    Hepatic lesion    History of gallstones    Hyperlipidemia    Hypertension    Hypothyroidism    Kidney stone    Lacrimal disorder    Myalgia and myositis, unspecified    Osteoarthrosis, unspecified whether generalized or localized, unspecified site    Osteopenia    Osteoporosis    Osteoporosis, unspecified    Other and unspecified hyperlipidemia    Other dysphagia    Pain in joint, forearm    Reflux esophagitis    Unspecified disorder of kidney and ureter    Unspecified essential hypertension    Unspecified hypothyroidism  Unspecified vitamin D  deficiency    Vitamin D  deficiency    Vitiligo    Vitiligo    Past Surgical History:  Procedure Laterality Date   COLONOSCOPY     LEG SURGERY     right   TUBAL LIGATION  1973   Social History:   reports that she has never smoked. She has never used smokeless tobacco. She reports current alcohol use. She reports that  she does not use drugs.  Family History  Problem Relation Age of Onset   Colon cancer Mother 6   Diabetes Mother    Diabetes Sister    Colon cancer Nephew    Esophageal cancer Neg Hx    Rectal cancer Neg Hx    Stomach cancer Neg Hx    Breast cancer Neg Hx     Medications: Patient's Medications  New Prescriptions   No medications on file  Previous Medications   AMLODIPINE  (NORVASC ) 10 MG TABLET    TAKE 1 TABLET BY MOUTH DAILY   CALCIUM  CARB-CHOLECALCIFEROL 600-800 MG-UNIT TABS    Take 1 tablet by mouth 2 (two) times daily.   EZETIMIBE  (ZETIA ) 10 MG TABLET    TAKE 1 TABLET BY MOUTH DAILY   FAMOTIDINE  (PEPCID ) 40 MG TABLET    TAKE 1 TABLET BY MOUTH AT  BEDTIME   LEVOTHYROXINE  (SYNTHROID ) 75 MCG TABLET    TAKE 1 TABLET BY MOUTH ONCE  DAILY FOR THYROID    PANTOPRAZOLE  (PROTONIX ) 40 MG TABLET    TAKE 1 TABLET BY MOUTH DAILY   POLYETHYLENE GLYCOL POWDER (GLYCOLAX /MIRALAX ) 17 GM/SCOOP POWDER    Take 17 g by mouth daily. Hold for loose stool  Modified Medications   No medications on file  Discontinued Medications   No medications on file    Physical Exam:  Vitals:   04/12/24 1126  BP: 120/64  Pulse: 85  Temp: (!) 97.1 F (36.2 C)  SpO2: 99%  Weight: 115 lb (52.2 kg)  Height: 5' 1.5 (1.562 m)   Body mass index is 21.67 kg/m. Wt Readings from Last 3 Encounters:  11/27/23 116 lb 9.6 oz (52.9 kg)  10/25/23 113 lb 9.6 oz (51.5 kg)  09/11/23 113 lb 12.8 oz (51.6 kg)    Physical Exam Constitutional:      General: She is not in acute distress.    Appearance: She is well-developed. She is not diaphoretic.  HENT:     Head: Normocephalic and atraumatic.     Mouth/Throat:     Pharynx: No oropharyngeal exudate.  Eyes:     Conjunctiva/sclera: Conjunctivae normal.     Pupils: Pupils are equal, round, and reactive to light.  Cardiovascular:     Rate and Rhythm: Normal rate and regular rhythm.     Heart sounds: Normal heart sounds.  Pulmonary:     Effort: Pulmonary effort is  normal.     Breath sounds: Normal breath sounds.  Abdominal:     General: Bowel sounds are normal.     Palpations: Abdomen is soft.  Musculoskeletal:     Cervical back: Normal range of motion and neck supple.     Right lower leg: No edema.     Left lower leg: No edema.  Skin:    General: Skin is warm and dry.  Neurological:     Mental Status: She is alert.  Psychiatric:        Mood and Affect: Mood normal.     Labs reviewed: Basic Metabolic Panel: Recent Labs    09/11/23  1229  NA 141  K 3.5  CL 104  CO2 24  GLUCOSE 83  BUN 15  CREATININE 0.73  CALCIUM  9.6  TSH 1.72   Liver Function Tests: Recent Labs    09/11/23 1229  AST 16  ALT 7  BILITOT 0.5  PROT 7.7   No results for input(s): LIPASE, AMYLASE in the last 8760 hours. No results for input(s): AMMONIA in the last 8760 hours. CBC: Recent Labs    09/11/23 1229  WBC 7.1  NEUTROABS 3,408  HGB 13.9  HCT 45.3*  MCV 88.3  PLT 221   Lipid Panel: Recent Labs    09/11/23 1229  CHOL 174  HDL 81  LDLCALC 79  TRIG 60  CHOLHDL 2.1   TSH: Recent Labs    09/11/23 1229  TSH 1.72   A1C: No results found for: HGBA1C   Assessment/Plan Assessment and Plan Assessment & Plan Vaginal discharge Persistent light discharge with occasional spotting, improved with antibiotics but unresolved. - Continue follow up with GYN appointment in February for further evaluation.  Essential hypertension Blood pressure well controlled on current regimen. - Continue Norvasc  10 mg daily. - Maintain low sodium diet.  Age-related osteoporosis Osteoporosis with fracture risk. Awaiting bone density results before additional medication. She has not wanted treatment in the past with medication.  - Continue calcium  and vitamin D  supplementation. - Await bone density results before considering additional osteoporosis medication.  Mixed hyperlipidemia Cholesterol levels at goal in June. On Zetia  10 mg daily. -  Continue Zetia  10 mg daily. -follow up CMP   Gastroesophageal reflux disease Indigestion and acid reflux well controlled with current regimen. - Continue Protonix  and Pepcid  as needed.  Hypothyroidism On Synthroid  75 mcg daily. TSH goal checked in June, recheck planned for next visit. - Continue Synthroid  75 mcg daily.  Chronic constipation Managed with daily Miralax . - Continue Miralax  daily.  General health maintenance Up to date with pneumonia vaccinations. - Administered flu shot.   Return in about 6 months (around 10/10/2024) for routine follow up.  Strummer Canipe K. Caro BODILY [facility] [phone]      [1] No Known Allergies  "

## 2024-04-12 NOTE — Patient Instructions (Signed)
 1.) Visit local pharmacy for COVID vaccine

## 2024-04-13 LAB — COMPREHENSIVE METABOLIC PANEL WITH GFR
AG Ratio: 1.6 (calc) (ref 1.0–2.5)
ALT: 6 U/L (ref 6–29)
AST: 14 U/L (ref 10–35)
Albumin: 4.8 g/dL (ref 3.6–5.1)
Alkaline phosphatase (APISO): 53 U/L (ref 37–153)
BUN: 18 mg/dL (ref 7–25)
CO2: 24 mmol/L (ref 20–32)
Calcium: 10 mg/dL (ref 8.6–10.4)
Chloride: 103 mmol/L (ref 98–110)
Creat: 0.77 mg/dL (ref 0.60–0.95)
Globulin: 3 g/dL (ref 1.9–3.7)
Glucose, Bld: 84 mg/dL (ref 65–139)
Potassium: 3.7 mmol/L (ref 3.5–5.3)
Sodium: 141 mmol/L (ref 135–146)
Total Bilirubin: 0.5 mg/dL (ref 0.2–1.2)
Total Protein: 7.8 g/dL (ref 6.1–8.1)
eGFR: 77 mL/min/1.73m2

## 2024-04-13 LAB — CBC WITH DIFFERENTIAL/PLATELET
Absolute Lymphocytes: 2542 {cells}/uL (ref 850–3900)
Absolute Monocytes: 520 {cells}/uL (ref 200–950)
Basophils Absolute: 39 {cells}/uL (ref 0–200)
Basophils Relative: 0.6 %
Eosinophils Absolute: 111 {cells}/uL (ref 15–500)
Eosinophils Relative: 1.7 %
HCT: 42.5 % (ref 35.9–46.0)
Hemoglobin: 13.6 g/dL (ref 11.7–15.5)
MCH: 27.5 pg (ref 27.0–33.0)
MCHC: 32 g/dL (ref 31.6–35.4)
MCV: 85.9 fL (ref 81.4–101.7)
MPV: 10.4 fL (ref 7.5–12.5)
Monocytes Relative: 8 %
Neutro Abs: 3289 {cells}/uL (ref 1500–7800)
Neutrophils Relative %: 50.6 %
Platelets: 217 Thousand/uL (ref 140–400)
RBC: 4.95 Million/uL (ref 3.80–5.10)
RDW: 12.9 % (ref 11.0–15.0)
Total Lymphocyte: 39.1 %
WBC: 6.5 Thousand/uL (ref 3.8–10.8)

## 2024-04-15 ENCOUNTER — Ambulatory Visit: Payer: Self-pay | Admitting: Nurse Practitioner

## 2024-05-07 ENCOUNTER — Encounter: Admitting: Obstetrics and Gynecology

## 2024-05-08 ENCOUNTER — Telehealth: Payer: Self-pay

## 2024-05-08 NOTE — Telephone Encounter (Signed)
 Pt called requesting a return call back.  Re: pt has new GYN appt on 3/23.  Akeela Busk,RN  05/07/24

## 2024-06-24 ENCOUNTER — Encounter

## 2024-08-19 ENCOUNTER — Other Ambulatory Visit (HOSPITAL_BASED_OUTPATIENT_CLINIC_OR_DEPARTMENT_OTHER)

## 2024-10-18 ENCOUNTER — Ambulatory Visit: Admitting: Nurse Practitioner

## 2024-10-28 ENCOUNTER — Encounter: Payer: Self-pay | Admitting: Nurse Practitioner
